# Patient Record
Sex: Male | Born: 1940 | Race: Asian | Hispanic: No | Marital: Married | Smoking: Former smoker
Health system: Southern US, Community
[De-identification: ages and names within clinical notes are randomized; demographics above are authoritative.]

## PROBLEM LIST (undated history)

## (undated) DIAGNOSIS — K219 Gastro-esophageal reflux disease without esophagitis: Secondary | ICD-10-CM

## (undated) DIAGNOSIS — E785 Hyperlipidemia, unspecified: Secondary | ICD-10-CM

## (undated) DIAGNOSIS — D61818 Other pancytopenia: Secondary | ICD-10-CM

## (undated) DIAGNOSIS — C9590 Leukemia, unspecified not having achieved remission: Secondary | ICD-10-CM

## (undated) DIAGNOSIS — C9200 Acute myeloblastic leukemia, not having achieved remission: Secondary | ICD-10-CM

## (undated) DIAGNOSIS — R51 Headache: Secondary | ICD-10-CM

## (undated) DIAGNOSIS — IMO0001 Reserved for inherently not codable concepts without codable children: Secondary | ICD-10-CM

## (undated) DIAGNOSIS — C3490 Malignant neoplasm of unspecified part of unspecified bronchus or lung: Secondary | ICD-10-CM

## (undated) DIAGNOSIS — D539 Nutritional anemia, unspecified: Secondary | ICD-10-CM

## (undated) DIAGNOSIS — T7840XA Allergy, unspecified, initial encounter: Secondary | ICD-10-CM

## (undated) DIAGNOSIS — I2510 Atherosclerotic heart disease of native coronary artery without angina pectoris: Secondary | ICD-10-CM

## (undated) DIAGNOSIS — F17200 Nicotine dependence, unspecified, uncomplicated: Secondary | ICD-10-CM

## (undated) DIAGNOSIS — I4891 Unspecified atrial fibrillation: Secondary | ICD-10-CM

## (undated) HISTORY — DX: Acute myeloblastic leukemia, not having achieved remission: C92.00

## (undated) HISTORY — DX: Atherosclerotic heart disease of native coronary artery without angina pectoris: I25.10

## (undated) HISTORY — DX: Unspecified atrial fibrillation: I48.91

## (undated) HISTORY — DX: Reserved for inherently not codable concepts without codable children: IMO0001

## (undated) HISTORY — DX: Other pancytopenia: D61.818

## (undated) HISTORY — DX: Leukemia, unspecified not having achieved remission: C95.90

## (undated) HISTORY — DX: Hyperlipidemia, unspecified: E78.5

## (undated) HISTORY — DX: Malignant neoplasm of unspecified part of unspecified bronchus or lung: C34.90

## (undated) HISTORY — DX: Nicotine dependence, unspecified, uncomplicated: F17.200

## (undated) HISTORY — DX: Nutritional anemia, unspecified: D53.9

## (undated) HISTORY — DX: Headache: R51

---

## 2003-12-13 ENCOUNTER — Ambulatory Visit (HOSPITAL_COMMUNITY): Admission: RE | Admit: 2003-12-13 | Discharge: 2003-12-13 | Payer: Self-pay | Admitting: Family Medicine

## 2004-06-04 ENCOUNTER — Emergency Department (HOSPITAL_COMMUNITY): Admission: EM | Admit: 2004-06-04 | Discharge: 2004-06-05 | Payer: Self-pay | Admitting: Emergency Medicine

## 2004-07-02 ENCOUNTER — Encounter: Admission: RE | Admit: 2004-07-02 | Discharge: 2004-07-29 | Payer: Self-pay | Admitting: Orthopedic Surgery

## 2004-07-30 ENCOUNTER — Encounter: Admission: RE | Admit: 2004-07-30 | Discharge: 2004-08-18 | Payer: Self-pay | Admitting: Orthopedic Surgery

## 2005-05-19 ENCOUNTER — Inpatient Hospital Stay (HOSPITAL_COMMUNITY): Admission: EM | Admit: 2005-05-19 | Discharge: 2005-05-22 | Payer: Self-pay | Admitting: Emergency Medicine

## 2005-07-22 ENCOUNTER — Emergency Department (HOSPITAL_COMMUNITY): Admission: EM | Admit: 2005-07-22 | Discharge: 2005-07-22 | Payer: Self-pay | Admitting: *Deleted

## 2007-01-11 ENCOUNTER — Inpatient Hospital Stay (HOSPITAL_COMMUNITY): Admission: EM | Admit: 2007-01-11 | Discharge: 2007-01-12 | Payer: Self-pay | Admitting: Emergency Medicine

## 2007-01-12 HISTORY — PX: CARDIAC CATHETERIZATION: SHX172

## 2007-01-23 HISTORY — PX: NM MYOCAR PERF WALL MOTION: HXRAD629

## 2007-12-23 ENCOUNTER — Emergency Department (HOSPITAL_COMMUNITY): Admission: EM | Admit: 2007-12-23 | Discharge: 2007-12-23 | Payer: Self-pay | Admitting: Emergency Medicine

## 2008-09-10 ENCOUNTER — Encounter: Admission: RE | Admit: 2008-09-10 | Discharge: 2008-10-20 | Payer: Self-pay | Admitting: Orthopaedic Surgery

## 2008-10-16 IMAGING — CT CT ANGIO CHEST
2 of 7 series · 19 of 36 positions shown · IV contrast (APPLIED)
Comparison: Chest x-ray dated 12/23/2007

Addendum Begins

There is a focal area of linear atelectasis at the left lung base
correlates with the abnormality on chest x-ray.  There is no
discrete infiltrate or pneumonia.
Addendum Ends
CLINICAL DATA: This of breath.  Atrial fibrillation.
CT ANGIOGRAPHY CHEST
TECHNIQUE: Multidetector CT imaging of the chest using the
standard protocol during bolus administration of intravenous
contrast. Multiplanar reconstructed images obtained and reviewed to
evaluate the vascular anatomy.
Contrast: 75 ml [id number]

[Series 8: pulm embolism 1.0 b25f thins · axial · 0.57mm/px · z∈[-132,+80]mm · 18 of 237 slices shown]
[im 12/237  lung]
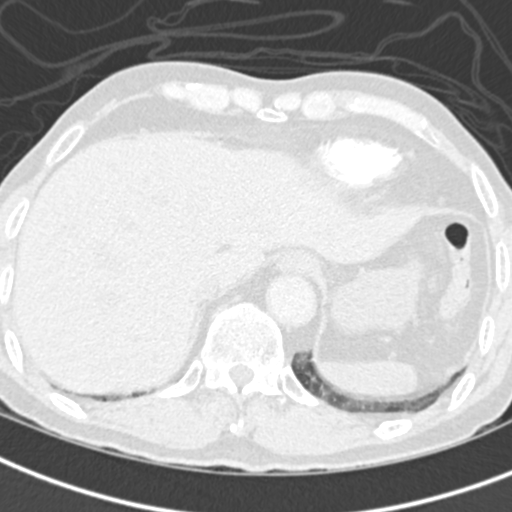
[im 24/237  mediastinal]
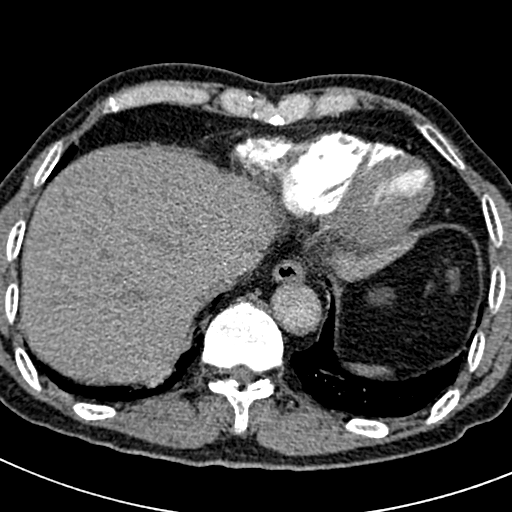
[im 36/237  lung]
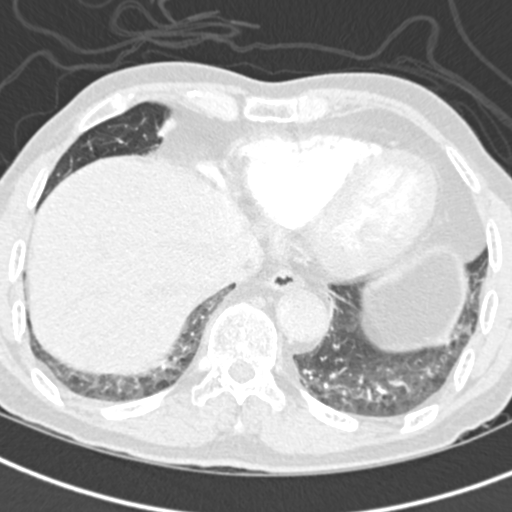
[im 48/237  mediastinal]
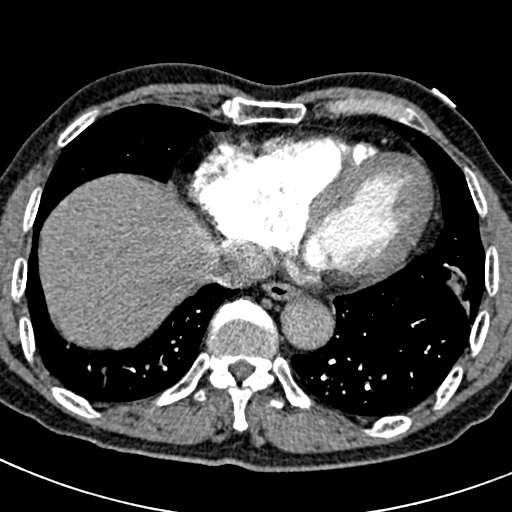
[im 60/237  lung]
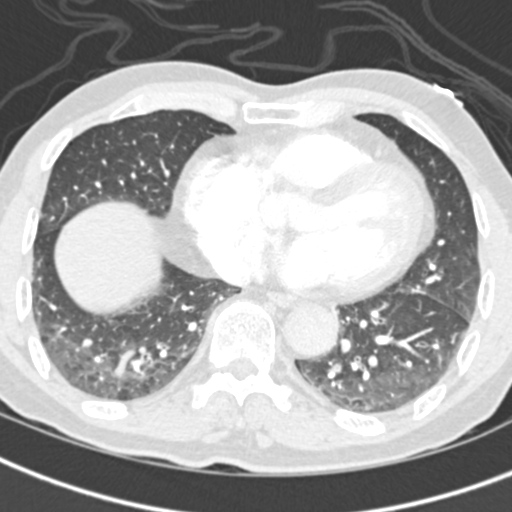
[im 71/237  mediastinal]
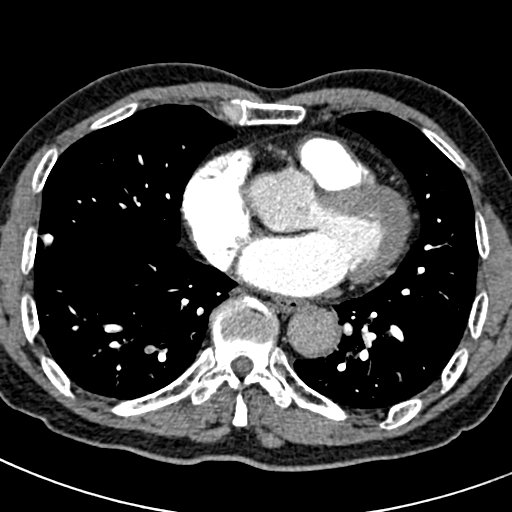
[im 83/237  lung]
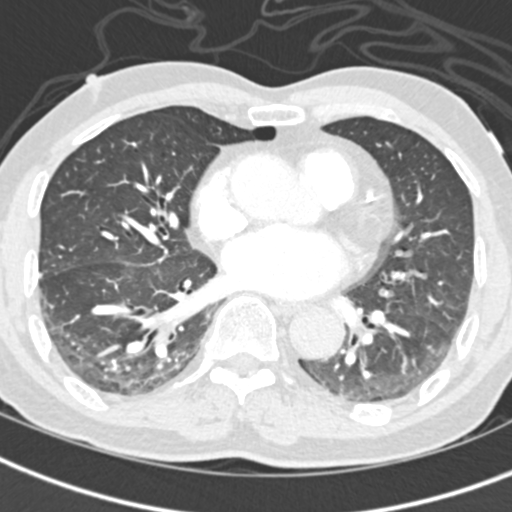
[im 95/237  mediastinal]
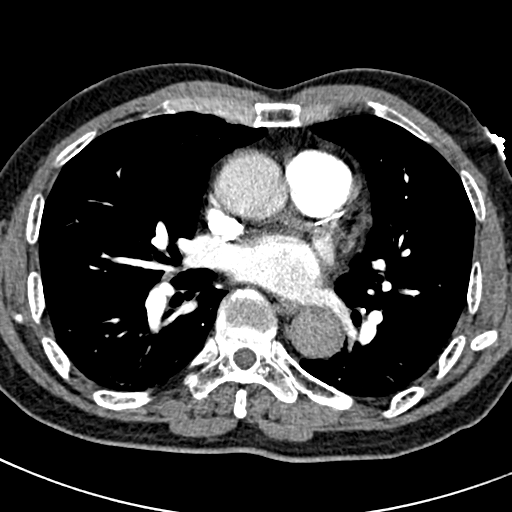
[im 107/237  lung]
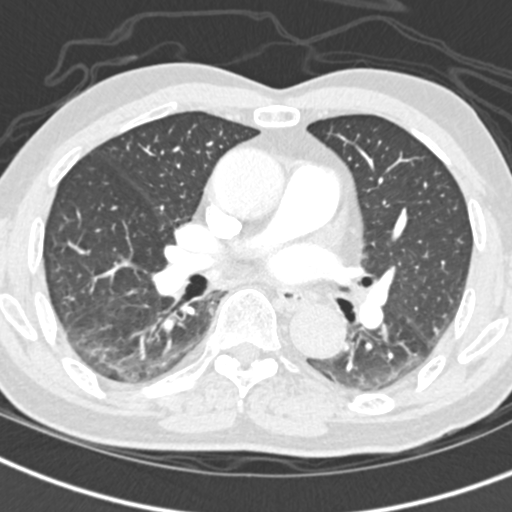
[im 130/237  mediastinal]
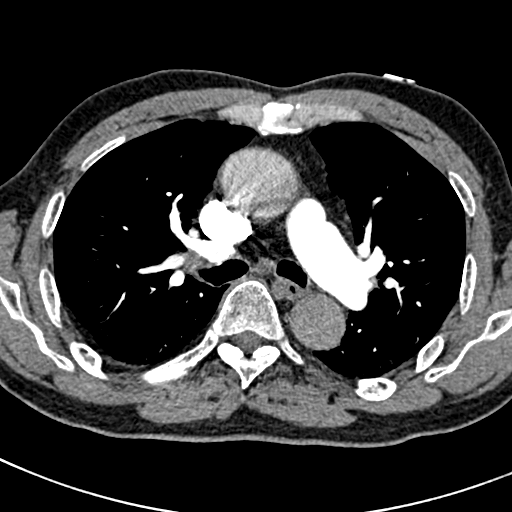
[im 142/237  lung]
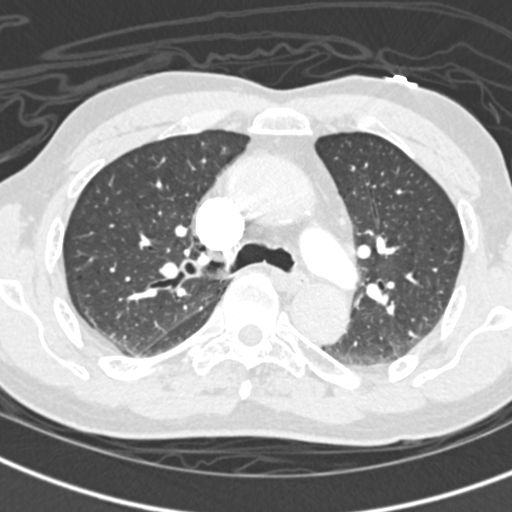
[im 154/237  mediastinal]
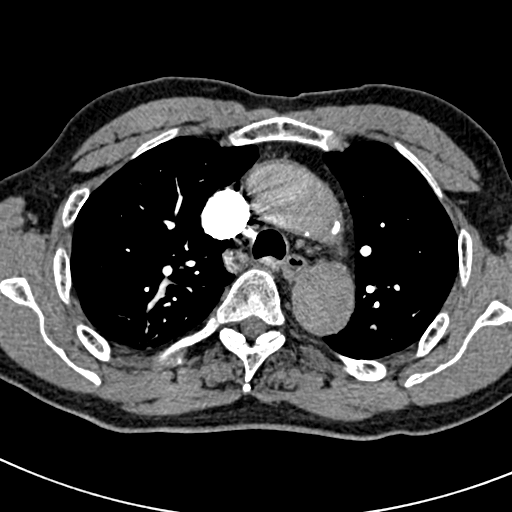
[im 166/237  lung]
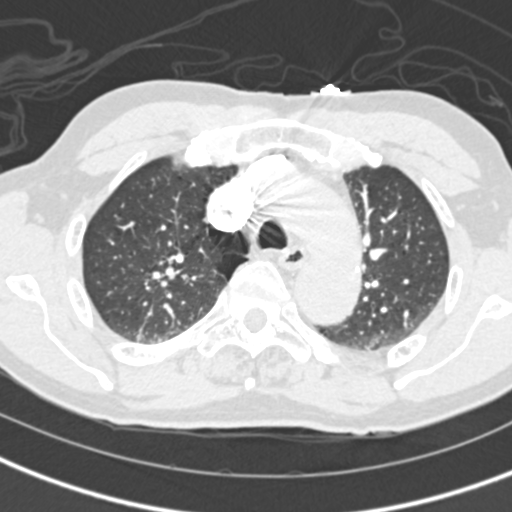
[im 178/237  mediastinal]
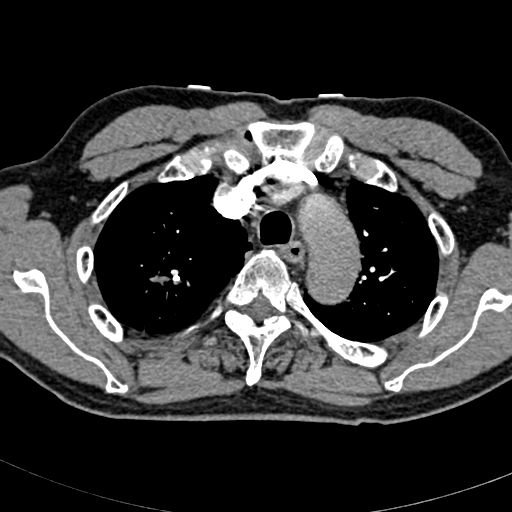
[im 189/237  lung]
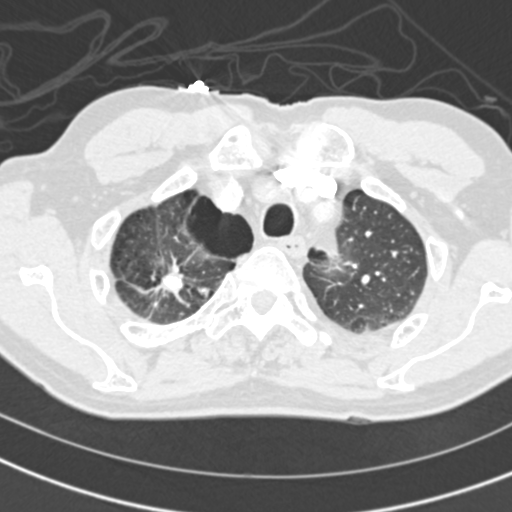
[im 201/237  mediastinal]
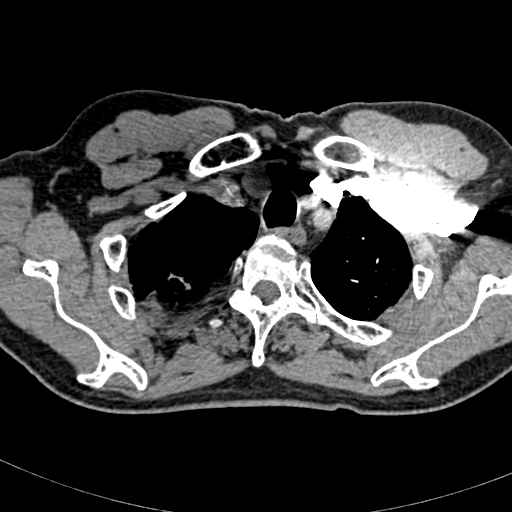
[im 213/237  lung]
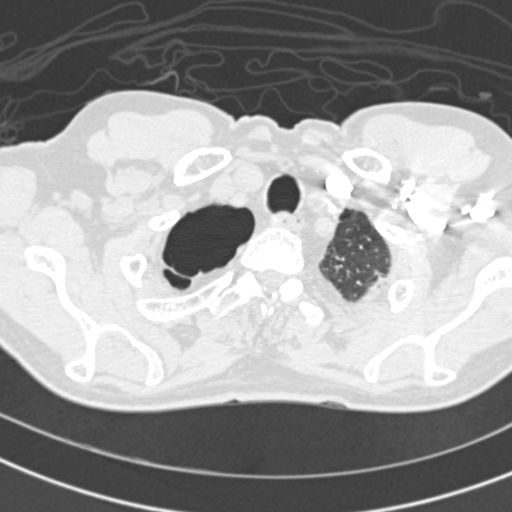
[im 225/237  mediastinal]
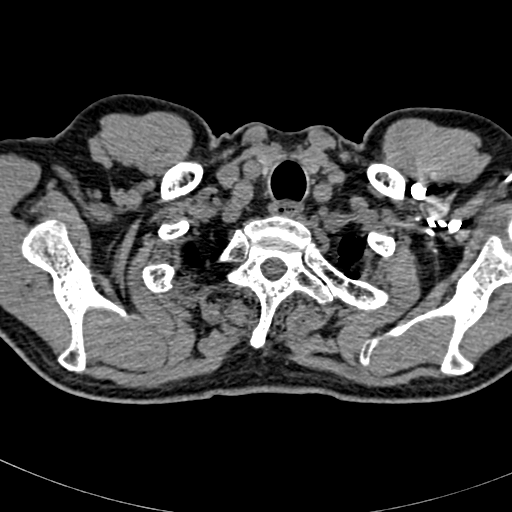

[Series 603: cor chest · coronal · 0.57mm/px · 1 of 92 slices shown]
[im 46/92  mediastinal]
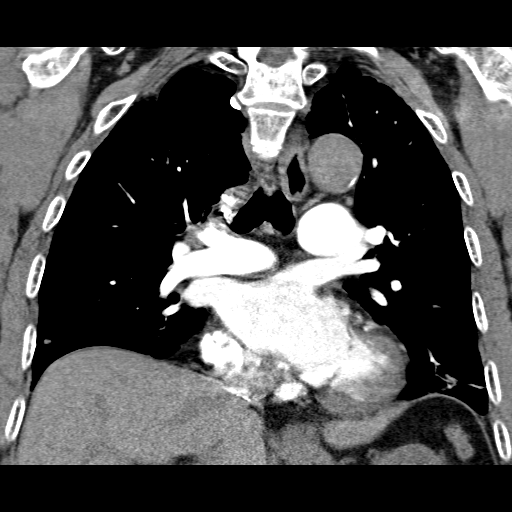

[19 of 36 positions shown; findings below may reference images not displayed]

FINDINGS: There is no evidence of pulmonary emboli, infiltrates, or
effusions.  There is a calcified 9 mm granuloma in the right middle
lobe adjacent to the major fissure at the right lung base.  There
are  areas of chronic scarring and bleb formation in the right apex
with some parenchymal calcifications.  This appears unchanged on
multiple prior chest x-rays.

There is a 3 mm nonspecific nodule along the superior aspect of the
major fissure on the right with a 1.5 mm peripheral nodule seen on
the same image, image number 46.  These are most likely benign.
There is another small ill-defined soft tissue density adjacent to
the main pulmonary vein from the right lower lobe on image number
50 which is probably a small lymph node  and is not felt to be
significant.

There is no significant hilar or mediastinal adenopathy.  There is
some tortuosity of the thoracic aorta.  The arch of the aorta is
slightly dilated to a diameter of approximate 4 cm.

No bony abnormality.  Borderline cardiomegaly.
IMPRESSION: No acute disease in the chest. No pulmonary emboli. Scarring at the
right apex.  Calcified granuloma in the right middle lobe
laterally.

## 2008-10-16 IMAGING — CR DG CHEST 2V
2 series · 2 of 2 positions shown · non-contrast
Comparison: 01/11/2007 and earlier.

CLINICAL DATA: 56-year-old male with chest pain.

CHEST - 2 VIEW

[w chest pa]
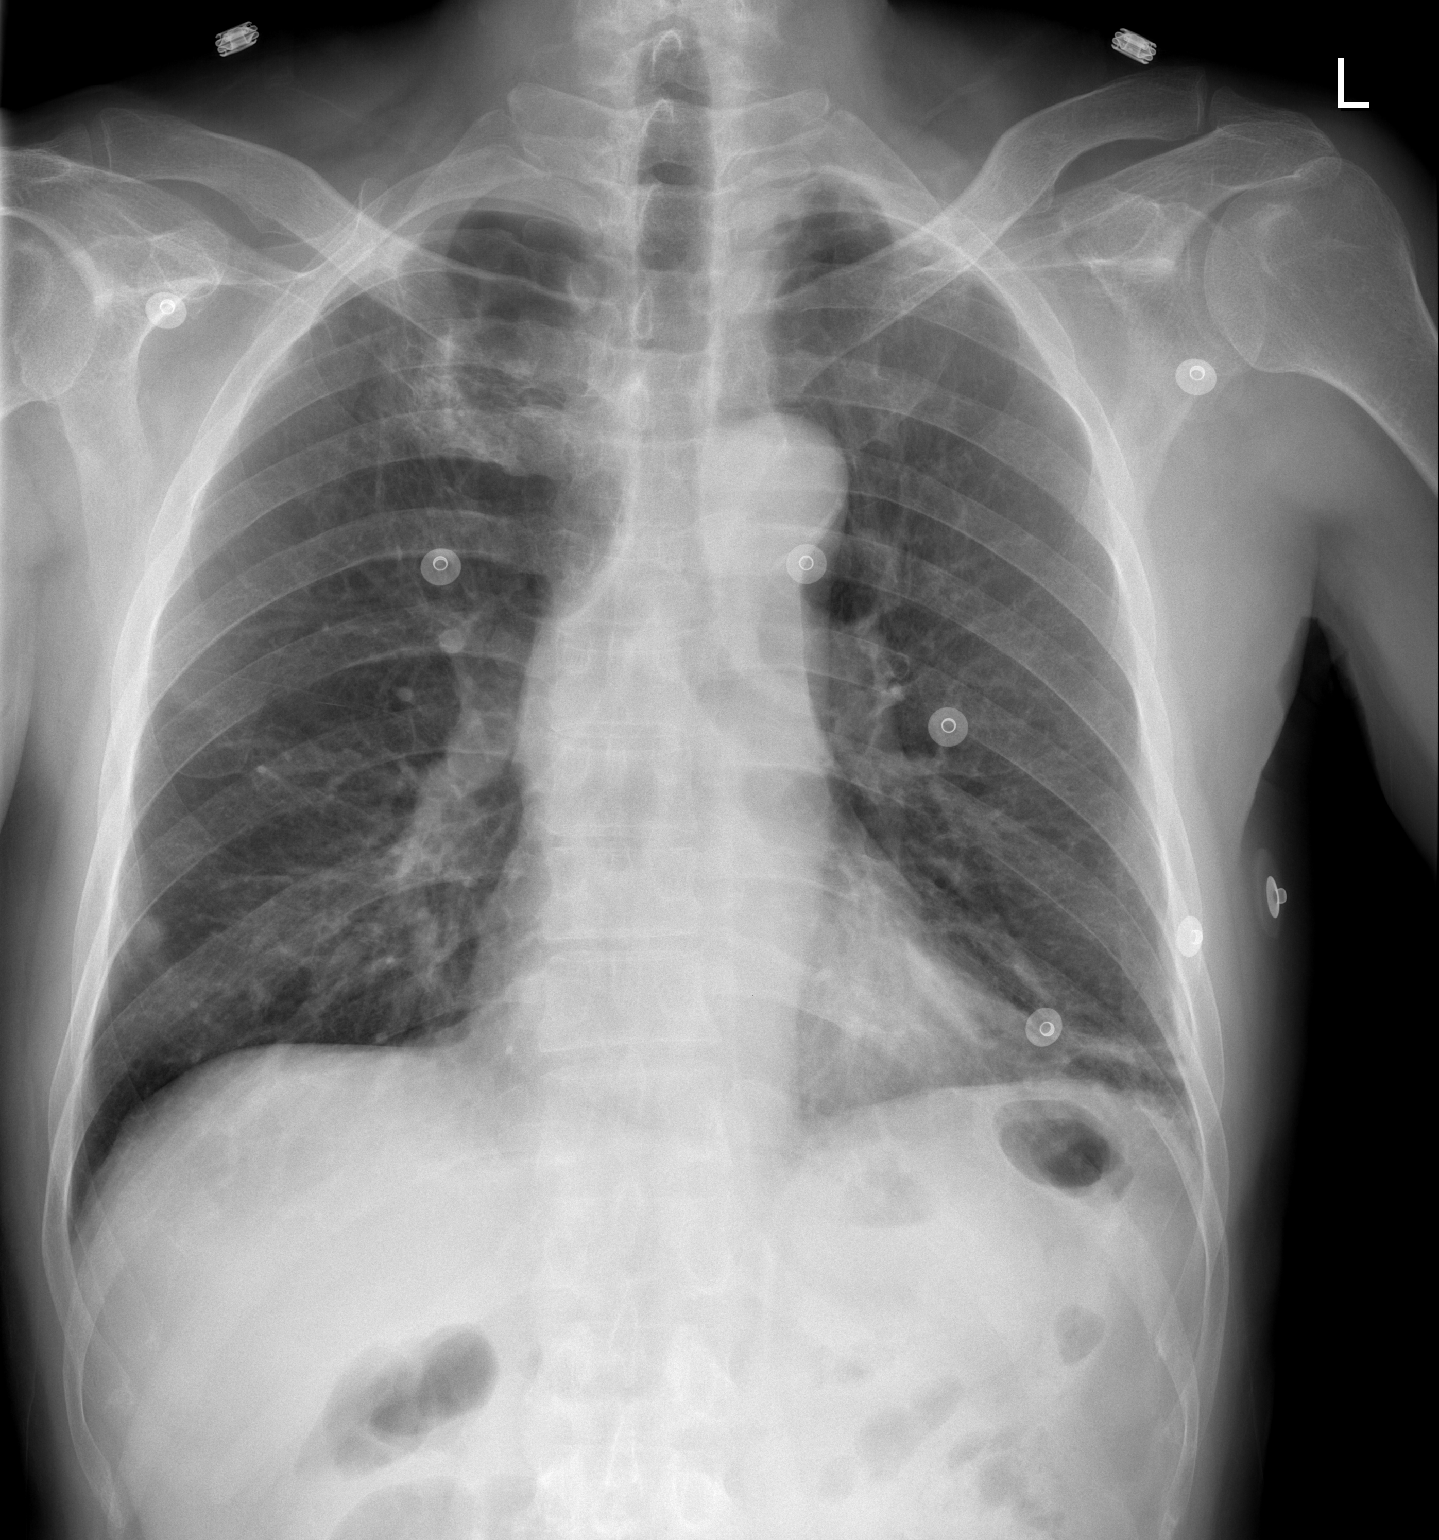

[w chest lat]
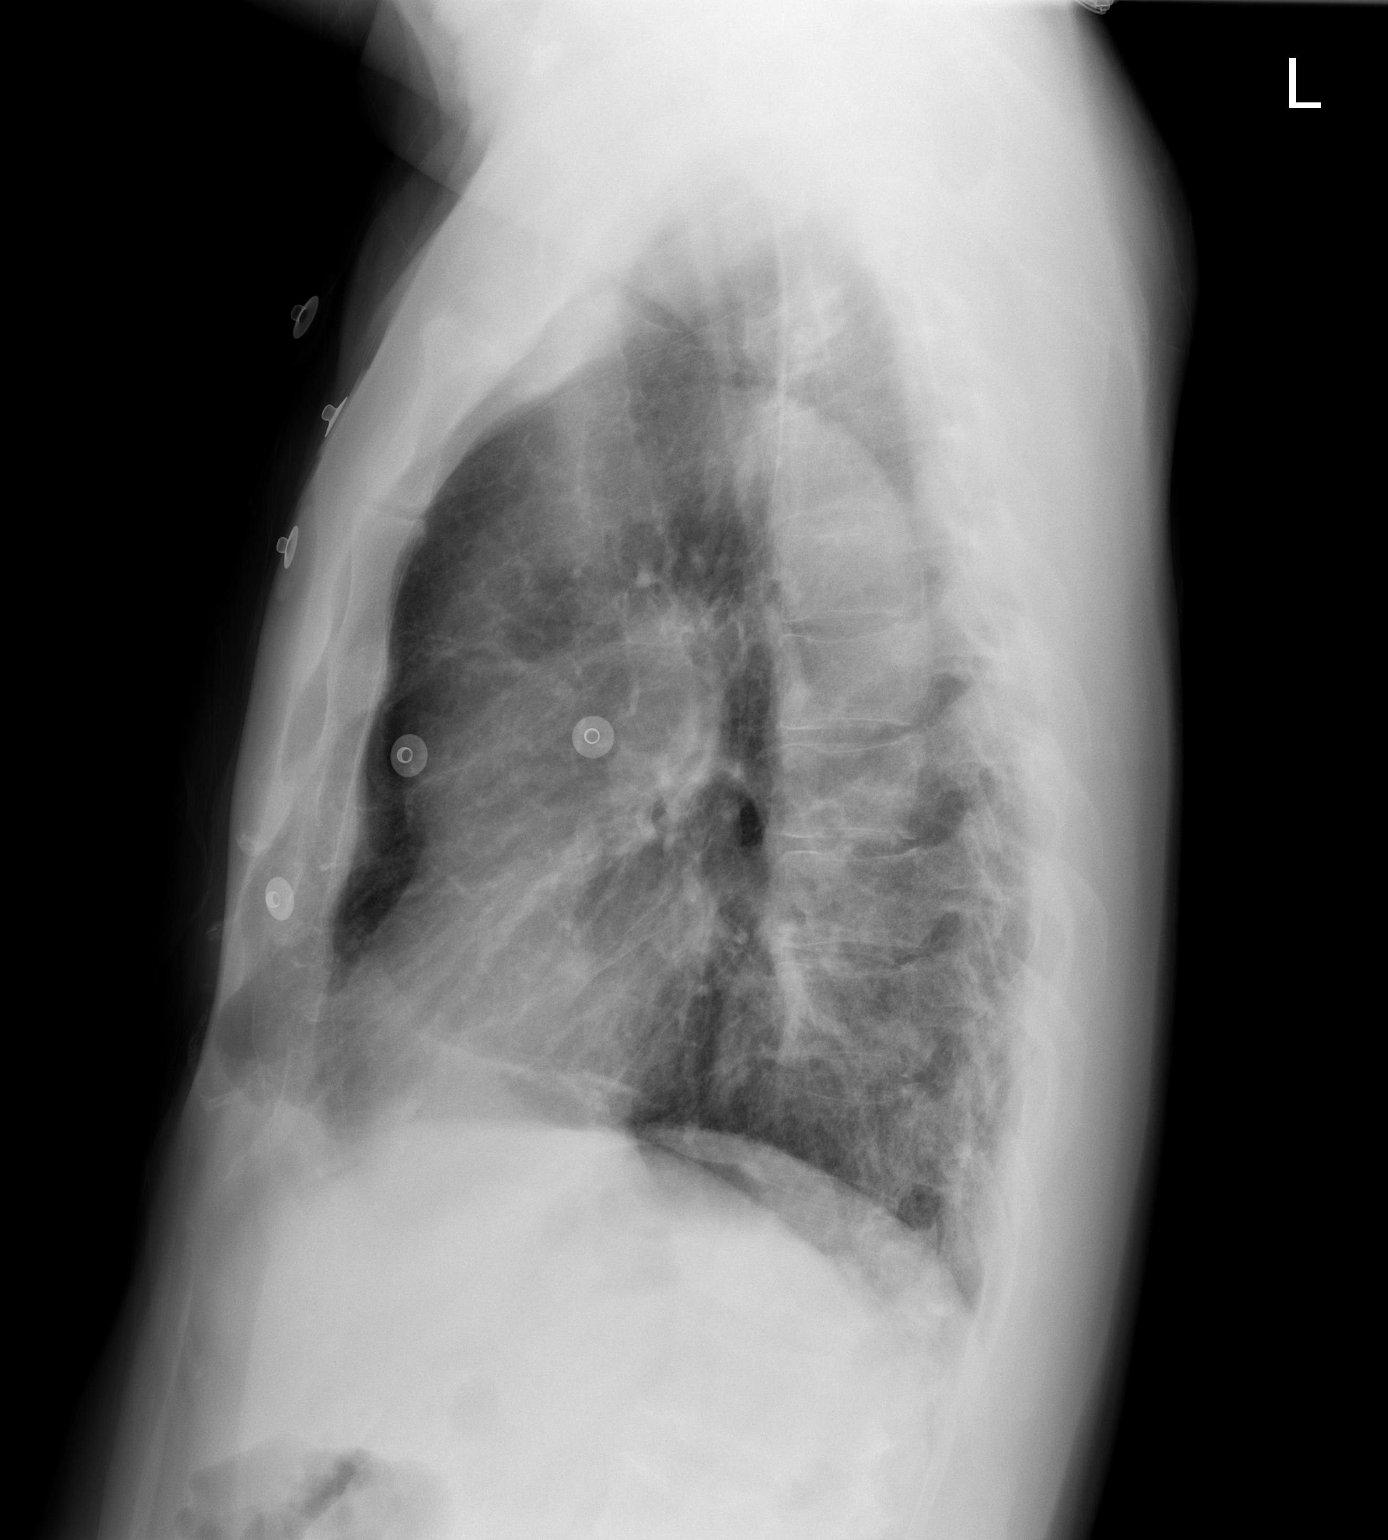

[2 of 2 positions shown; findings below may reference images not displayed]

FINDINGS: Stable cardiac size and mediastinal contours.  Ectatic
thoracic aorta re-identified.  Interval increased streaky left
lower lobe opacity.  Chronic right lung pulmonary nodule measures
roughly 9 mm (6 - 7 mm on 06/04/2004), suggesting benign etiology.
No pneumothorax, pulmonary edema or pleural effusion. Stable
visualized osseous structures.
IMPRESSION: 1.  Developing left lower lobe pneumonia.
2.  Chronic right lung base nodule, favor benign etiology given
little growth since 5002.  Attention on follow-up exams.

## 2009-09-10 ENCOUNTER — Inpatient Hospital Stay (HOSPITAL_COMMUNITY): Admission: EM | Admit: 2009-09-10 | Discharge: 2009-09-16 | Payer: Self-pay | Admitting: Emergency Medicine

## 2009-09-11 ENCOUNTER — Encounter (INDEPENDENT_AMBULATORY_CARE_PROVIDER_SITE_OTHER): Payer: Self-pay | Admitting: Family Medicine

## 2009-09-14 ENCOUNTER — Encounter (INDEPENDENT_AMBULATORY_CARE_PROVIDER_SITE_OTHER): Payer: Self-pay | Admitting: Cardiology

## 2010-06-13 LAB — DIFFERENTIAL
Basophils Absolute: 0 10*3/uL (ref 0.0–0.1)
Basophils Relative: 1 % (ref 0–1)
Eosinophils Absolute: 0.6 10*3/uL (ref 0.0–0.7)
Eosinophils Relative: 9 % — ABNORMAL HIGH (ref 0–5)
Lymphocytes Relative: 37 % (ref 12–46)
Lymphs Abs: 2.2 10*3/uL (ref 0.7–4.0)
Monocytes Absolute: 0.6 10*3/uL (ref 0.1–1.0)
Monocytes Relative: 10 % (ref 3–12)
Neutro Abs: 2.6 10*3/uL (ref 1.7–7.7)
Neutrophils Relative %: 43 % (ref 43–77)

## 2010-06-13 LAB — CBC
HCT: 35.3 % — ABNORMAL LOW (ref 39.0–52.0)
HCT: 37.2 % — ABNORMAL LOW (ref 39.0–52.0)
HCT: 37.5 % — ABNORMAL LOW (ref 39.0–52.0)
HCT: 38.3 % — ABNORMAL LOW (ref 39.0–52.0)
HCT: 38.4 % — ABNORMAL LOW (ref 39.0–52.0)
HCT: 39.1 % (ref 39.0–52.0)
HCT: 40.2 % (ref 39.0–52.0)
Hemoglobin: 12 g/dL — ABNORMAL LOW (ref 13.0–17.0)
Hemoglobin: 12.7 g/dL — ABNORMAL LOW (ref 13.0–17.0)
Hemoglobin: 12.9 g/dL — ABNORMAL LOW (ref 13.0–17.0)
Hemoglobin: 13.3 g/dL (ref 13.0–17.0)
Hemoglobin: 13.3 g/dL (ref 13.0–17.0)
Hemoglobin: 13.5 g/dL (ref 13.0–17.0)
Hemoglobin: 13.6 g/dL (ref 13.0–17.0)
MCHC: 33.9 g/dL (ref 30.0–36.0)
MCHC: 33.9 g/dL (ref 30.0–36.0)
MCHC: 34.1 g/dL (ref 30.0–36.0)
MCHC: 34.5 g/dL (ref 30.0–36.0)
MCHC: 34.6 g/dL (ref 30.0–36.0)
MCHC: 34.7 g/dL (ref 30.0–36.0)
MCHC: 34.7 g/dL (ref 30.0–36.0)
MCV: 95.2 fL (ref 78.0–100.0)
MCV: 95.5 fL (ref 78.0–100.0)
MCV: 95.7 fL (ref 78.0–100.0)
MCV: 96.1 fL (ref 78.0–100.0)
MCV: 96.2 fL (ref 78.0–100.0)
MCV: 96.5 fL (ref 78.0–100.0)
MCV: 97.2 fL (ref 78.0–100.0)
Platelets: 115 10*3/uL — ABNORMAL LOW (ref 150–400)
Platelets: 121 10*3/uL — ABNORMAL LOW (ref 150–400)
Platelets: 130 10*3/uL — ABNORMAL LOW (ref 150–400)
Platelets: 131 10*3/uL — ABNORMAL LOW (ref 150–400)
Platelets: 134 10*3/uL — ABNORMAL LOW (ref 150–400)
Platelets: 138 10*3/uL — ABNORMAL LOW (ref 150–400)
Platelets: 142 10*3/uL — ABNORMAL LOW (ref 150–400)
RBC: 3.67 MIL/uL — ABNORMAL LOW (ref 4.22–5.81)
RBC: 3.9 MIL/uL — ABNORMAL LOW (ref 4.22–5.81)
RBC: 3.9 MIL/uL — ABNORMAL LOW (ref 4.22–5.81)
RBC: 4 MIL/uL — ABNORMAL LOW (ref 4.22–5.81)
RBC: 4.03 MIL/uL — ABNORMAL LOW (ref 4.22–5.81)
RBC: 4.05 MIL/uL — ABNORMAL LOW (ref 4.22–5.81)
RBC: 4.13 MIL/uL — ABNORMAL LOW (ref 4.22–5.81)
RDW: 13.6 % (ref 11.5–15.5)
RDW: 13.8 % (ref 11.5–15.5)
RDW: 13.9 % (ref 11.5–15.5)
RDW: 13.9 % (ref 11.5–15.5)
RDW: 13.9 % (ref 11.5–15.5)
RDW: 14 % (ref 11.5–15.5)
RDW: 14 % (ref 11.5–15.5)
WBC: 6 10*3/uL (ref 4.0–10.5)
WBC: 6.5 10*3/uL (ref 4.0–10.5)
WBC: 7.4 10*3/uL (ref 4.0–10.5)
WBC: 7.4 10*3/uL (ref 4.0–10.5)
WBC: 8.4 10*3/uL (ref 4.0–10.5)
WBC: 8.9 10*3/uL (ref 4.0–10.5)
WBC: 9.4 10*3/uL (ref 4.0–10.5)

## 2010-06-13 LAB — BASIC METABOLIC PANEL
BUN: 11 mg/dL (ref 6–23)
BUN: 11 mg/dL (ref 6–23)
BUN: 17 mg/dL (ref 6–23)
BUN: 8 mg/dL (ref 6–23)
BUN: 8 mg/dL (ref 6–23)
BUN: 9 mg/dL (ref 6–23)
BUN: 9 mg/dL (ref 6–23)
CO2: 20 mEq/L (ref 19–32)
CO2: 22 mEq/L (ref 19–32)
CO2: 25 mEq/L (ref 19–32)
CO2: 25 mEq/L (ref 19–32)
CO2: 26 mEq/L (ref 19–32)
CO2: 26 mEq/L (ref 19–32)
CO2: 28 mEq/L (ref 19–32)
Calcium: 8.1 mg/dL — ABNORMAL LOW (ref 8.4–10.5)
Calcium: 8.3 mg/dL — ABNORMAL LOW (ref 8.4–10.5)
Calcium: 8.3 mg/dL — ABNORMAL LOW (ref 8.4–10.5)
Calcium: 8.5 mg/dL (ref 8.4–10.5)
Calcium: 8.5 mg/dL (ref 8.4–10.5)
Calcium: 8.8 mg/dL (ref 8.4–10.5)
Calcium: 9 mg/dL (ref 8.4–10.5)
Chloride: 107 mEq/L (ref 96–112)
Chloride: 109 mEq/L (ref 96–112)
Chloride: 109 mEq/L (ref 96–112)
Chloride: 109 mEq/L (ref 96–112)
Chloride: 111 mEq/L (ref 96–112)
Chloride: 112 mEq/L (ref 96–112)
Chloride: 112 mEq/L (ref 96–112)
Creatinine, Ser: 0.85 mg/dL (ref 0.4–1.5)
Creatinine, Ser: 0.88 mg/dL (ref 0.4–1.5)
Creatinine, Ser: 0.89 mg/dL (ref 0.4–1.5)
Creatinine, Ser: 0.95 mg/dL (ref 0.4–1.5)
Creatinine, Ser: 0.97 mg/dL (ref 0.4–1.5)
Creatinine, Ser: 1.01 mg/dL (ref 0.4–1.5)
Creatinine, Ser: 1.06 mg/dL (ref 0.4–1.5)
GFR calc Af Amer: >60 mL/min (ref >60)
GFR calc Af Amer: >60 mL/min (ref >60)
GFR calc Af Amer: >60 mL/min (ref >60)
GFR calc Af Amer: >60 mL/min (ref >60)
GFR calc Af Amer: >60 mL/min (ref >60)
GFR calc Af Amer: >60 mL/min (ref >60)
GFR calc Af Amer: >60 mL/min (ref >60)
GFR calc non Af Amer: >60 mL/min (ref >60)
GFR calc non Af Amer: >60 mL/min (ref >60)
GFR calc non Af Amer: >60 mL/min (ref >60)
GFR calc non Af Amer: >60 mL/min (ref >60)
GFR calc non Af Amer: >60 mL/min (ref >60)
GFR calc non Af Amer: >60 mL/min (ref >60)
GFR calc non Af Amer: >60 mL/min (ref >60)
Glucose, Bld: 100 mg/dL — ABNORMAL HIGH (ref 70–99)
Glucose, Bld: 102 mg/dL — ABNORMAL HIGH (ref 70–99)
Glucose, Bld: 103 mg/dL — ABNORMAL HIGH (ref 70–99)
Glucose, Bld: 108 mg/dL — ABNORMAL HIGH (ref 70–99)
Glucose, Bld: 112 mg/dL — ABNORMAL HIGH (ref 70–99)
Glucose, Bld: 89 mg/dL (ref 70–99)
Glucose, Bld: 98 mg/dL (ref 70–99)
Potassium: 3.3 mEq/L — ABNORMAL LOW (ref 3.5–5.1)
Potassium: 3.4 mEq/L — ABNORMAL LOW (ref 3.5–5.1)
Potassium: 3.5 mEq/L (ref 3.5–5.1)
Potassium: 3.6 mEq/L (ref 3.5–5.1)
Potassium: 3.6 mEq/L (ref 3.5–5.1)
Potassium: 3.7 mEq/L (ref 3.5–5.1)
Potassium: 5 mEq/L (ref 3.5–5.1)
Sodium: 138 mEq/L (ref 135–145)
Sodium: 139 mEq/L (ref 135–145)
Sodium: 140 mEq/L (ref 135–145)
Sodium: 140 mEq/L (ref 135–145)
Sodium: 140 mEq/L (ref 135–145)
Sodium: 141 mEq/L (ref 135–145)
Sodium: 141 mEq/L (ref 135–145)

## 2010-06-13 LAB — URINALYSIS, ROUTINE W REFLEX MICROSCOPIC
Bilirubin Urine: NEGATIVE
Glucose, UA: NEGATIVE mg/dL
Hgb urine dipstick: NEGATIVE
Ketones, ur: NEGATIVE mg/dL
Nitrite: NEGATIVE
Protein, ur: NEGATIVE mg/dL
Specific Gravity, Urine: 1.007 (ref 1.005–1.030)
Urobilinogen, UA: 0.2 mg/dL (ref 0.0–1.0)
pH: 5.5 (ref 5.0–8.0)

## 2010-06-13 LAB — CK TOTAL AND CKMB (NOT AT ARMC)
CK, MB: 5.6 ng/mL — ABNORMAL HIGH (ref 0.3–4.0)
CK, MB: 9.1 ng/mL (ref 0.3–4.0)
Relative Index: 1.9 (ref 0.0–2.5)
Relative Index: 2.1 (ref 0.0–2.5)
Total CK: 302 U/L — ABNORMAL HIGH (ref 7–232)
Total CK: 440 U/L — ABNORMAL HIGH (ref 7–232)

## 2010-06-13 LAB — POCT CARDIAC MARKERS
CKMB, poc: 5.3 ng/mL (ref 1.0–8.0)
CKMB, poc: 5.5 ng/mL (ref 1.0–8.0)
Myoglobin, poc: 116 ng/mL (ref 12–200)
Myoglobin, poc: 195 ng/mL (ref 12–200)
Troponin i, poc: <0.05 ng/mL (ref 0.00–0.09)
Troponin i, poc: <0.05 ng/mL (ref 0.00–0.09)

## 2010-06-13 LAB — PROTIME-INR
INR: 0.98 (ref 0.00–1.49)
INR: 1.1 (ref 0.00–1.49)
INR: 1.69 — ABNORMAL HIGH (ref 0.00–1.49)
INR: 2 — ABNORMAL HIGH (ref 0.00–1.49)
INR: 2.31 — ABNORMAL HIGH (ref 0.00–1.49)
INR: 2.32 — ABNORMAL HIGH (ref 0.00–1.49)
Prothrombin Time: 12.9 seconds (ref 11.6–15.2)
Prothrombin Time: 14.1 seconds (ref 11.6–15.2)
Prothrombin Time: 19.7 seconds — ABNORMAL HIGH (ref 11.6–15.2)
Prothrombin Time: 22.5 seconds — ABNORMAL HIGH (ref 11.6–15.2)
Prothrombin Time: 25.2 seconds — ABNORMAL HIGH (ref 11.6–15.2)
Prothrombin Time: 25.3 seconds — ABNORMAL HIGH (ref 11.6–15.2)

## 2010-06-13 LAB — HEPARIN LEVEL (UNFRACTIONATED)
Heparin Unfractionated: 0.28 IU/mL — ABNORMAL LOW (ref 0.30–0.70)
Heparin Unfractionated: 0.5 IU/mL (ref 0.30–0.70)
Heparin Unfractionated: 0.5 IU/mL (ref 0.30–0.70)
Heparin Unfractionated: 0.58 IU/mL (ref 0.30–0.70)
Heparin Unfractionated: 0.73 IU/mL — ABNORMAL HIGH (ref 0.30–0.70)
Heparin Unfractionated: 0.78 IU/mL — ABNORMAL HIGH (ref 0.30–0.70)

## 2010-06-13 LAB — URINE CULTURE
Colony Count: NO GROWTH
Culture: NO GROWTH

## 2010-06-13 LAB — TROPONIN I
Troponin I: 0.01 ng/mL (ref 0.00–0.06)
Troponin I: 0.04 ng/mL (ref 0.00–0.06)

## 2010-06-13 LAB — LIPID PANEL
Cholesterol: 121 mg/dL (ref 0–200)
HDL: 41 mg/dL (ref >39)
LDL Cholesterol: 74 mg/dL (ref 0–99)
Total CHOL/HDL Ratio: 3 RATIO
Triglycerides: 29 mg/dL (ref <150)
VLDL: 6 mg/dL (ref 0–40)

## 2010-06-13 LAB — ANTI-RIBONUCLEIC ACID ANTIBODY: Sm/rnp: <1 AU/mL (ref <30)

## 2010-06-13 LAB — MAGNESIUM
Magnesium: 1.9 mg/dL (ref 1.5–2.5)
Magnesium: 1.9 mg/dL (ref 1.5–2.5)

## 2010-06-13 LAB — TSH: TSH: 2.407 u[IU]/mL (ref 0.350–4.500)

## 2010-06-13 LAB — T4, FREE: Free T4: 1.22 ng/dL (ref 0.80–1.80)

## 2010-06-13 LAB — APTT: aPTT: 39 seconds — ABNORMAL HIGH (ref 24–37)

## 2010-06-13 LAB — MRSA PCR SCREENING: MRSA by PCR: NEGATIVE

## 2010-08-10 NOTE — Discharge Summary (Signed)
Gerald [name], Gerald [name]                   ACCOUNT NO.:  [id number]   MEDICAL RECORD NO.:  [id number]          PATIENT TYPE:  INP   LOCATION:  2922                         FACILITY:  [facility]   PHYSICIAN:  Madaline Savage, M.D.DATE OF BIRTH:  1940/12/18   DATE OF ADMISSION:  01/11/2007  DATE OF DISCHARGE:  01/12/2007                               DISCHARGE SUMMARY   DISCHARGE DIAGNOSES:  1. Chest pain worrisome for unstable angina, catheterization this      admission showing mild to moderate coronary disease.  Plan is for      medical therapy.  2. Treated dyslipidemia  3. History of paroxysmal atrial fibrillation  in the past.   HOSPITAL COURSE:  The patient is a 70 year old [place] male with  history of prior catheterization showing nonobstructive coronary  disease.  He presented to the emergency room January 11, 2007 with chest  pain, dyspnea and weakness.  Symptoms were worrisome for unstable  angina.  Symptoms came on while he was exercising.  The patient was  admitted to telemetry, started on Lovenox.  He had been on a beta  blocker in the past but he had stopped this and this was resumed.  He  underwent diagnostic catheterization January 12, 2007 by Dr. Alanda Amass.  His enzymes were negative x3.  Catheterization revealed a 30-40% mid  RCA, 40-50% distal RCA, 40% proximal LAD and normal circ and OM with an  EF of 60%.  Dr. Alanda Amass feels we can treat him medically.  There is  essentially no change in his anatomy.  We will add a PPI.  Dr. Alanda Amass  wants him to have an outpatient Myoview study and we will set this up  and then he will follow-up with Dr. Elsie Lincoln.   DISCHARGE MEDICATIONS:  1. Toprol XL 25 mg a day.  2. Coated aspirin 325 mg a day  3. Lipitor 10 mg a day  4,  Protonix 40 mg a day for 1 month then Prilosec OTC 20 mg daily.   LABS:  Troponins as noted were negative.  Cholesterol was 151 with an  LDL of 90, HDL 44.  Liver functions were normal.  Sodium 141, potassium  3.8, BUN 12, creatinine 1.0, white count 6.6, hemoglobin 13.4,  hematocrit 39.3, platelets 172.  TSH 1.65. Portable chest:  Borderline  heart size, COPD, right upper lobe scarring with no acute changes.  EKG  was sinus rhythm without acute changes.   DISPOSITION:  The patient is discharged in stable condition and will  follow-up with Dr. Elsie Lincoln after an outpatient Persantine Myoview.      Abelino Derrick, P.A.    ______________________________  Madaline Savage, M.D.   Lenard Lance  D:  01/12/2007  T:  01/14/2007  Job:  [id number]

## 2010-08-10 NOTE — Cardiovascular Report (Signed)
[name], RINGEL                   ACCOUNT NO.:  [id number]   MEDICAL RECORD NO.:  [id number]          PATIENT TYPE:  INP   LOCATION:  2922                         FACILITY:  [facility]   PHYSICIAN:  Richard A. Alanda Amass, M.D.DATE OF BIRTH:  1940/12/31   DATE OF PROCEDURE:  01/12/2007  DATE OF DISCHARGE:  01/12/2007                            CARDIAC CATHETERIZATION   PROCEDURE:  Retrograde central aortic catheterization, selective  coronary angiography pre and post IC nitroglycerin administration by  Judkins technique, LV angiogram, RAO, LAO projection, abdominal aortic  angiogram, midstream PA  projection, right femoral artery, common  femoral artery closure with 6 French StarClose Nitinol clip device  successful.   DESCRIPTION OF PROCEDURE:  The patient was brought to the second floor  CP lab in a post absorptive state after 5 mg of Valium p.o.  premedication.  Heparin was held on call to the catheter lab; 1%  Xylocaine was used for local anesthesia.  No further conscious sedation  was necessary in the laboratory.  Mr. Asfaw son acted as Nurse, learning disability  and interpreter, was very helpful throughout the procedure which the  patient tolerated well. The right groin was prepped, draped in the usual  sterile manner.  The CRFA was entered with a single anterior puncture,  using an 18 thin-wall needle and a 6 [place] short.  Side arm and sheath  were inserted without difficulty.  Diagnostic left coronary angiography  was done with 6 French 4 cm taper, preformed Cordis coronary catheter.  IC nitroglycerin was administered at 150 mcg into the left coronary  system with repeat injections obtained.  Initially right coronary  angiography was done with a 6 French 4 cm J __________ catheter but  because of some damping related to a large proximal RV branch, this was  changed to a 5 French 4 cm taper catheter for selective main right  coronary injections.  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  LV angiogram was done in the RAO and LAO  projection, 25 mL, 14 mL/sec., 20 mL, 12 mL/sec. through a 6 French  pigtail catheter.  Pull back pressure of the CA was performed, showed no  gradient across the aortic valve.  The catheter was pulled down above  the level of the renal arteries and injection was performed.  There were  single normal patent renal arteries bilaterally and normal infrarenal  abdominal aorta.   PRESSURES:  LV:  115/0; LVEDP 15 mmHg.  CA:  115/60 mmHg.  There is no gradient across the aortic valve on pull  back.  Fluoroscopy:  The underlying fluoroscopy showed +3 segmental  calcification of the LAD. That appeared to be predominantly eccentric  and nonobstructive.  There was faint mitral annular calcification seen.  No significant right coronary calcification.   The main left coronary artery was widely patent and normal.   The left anterior descending artery had 40% mildly segmental narrowing  in the proximal third eccentrically with nonobstructive calcification  present.  This appeared unchanged from prior angiography in May 20, 2005.  There was systolic kinking of the mid-LAD but no systolic  Compression and there was systolic kinking of the distal third of the  LAD with no systolic compression.  The LAD was a large vessel that  coursed the apex of the heart and bifurcated.   The first diagonal was a moderate size vessel to small which bifurcated,  arose from the very proximal LAD and had no significant obstruction.  The second diagonal was a moderate size vessel that rose after the  septal perforator and proximal LAD mild narrowing, bifurcated and had no  significant stenosis.   The circumflex was a moderately large nondominant vessel that gave off  an atrial branch proximally.  There  was a small OM1 and a large  bifurcating OM2 from the AV group circumflex.  The PAVG branch  bifurcated and was large and had significant stenosis.   The right coronary was a dominant vessel.  There was a large RV branch  that was normal arising from the proximal portion.   There were tandem lesions in the mid-RCA.  There was eccentric 30 to 40%  narrowing that was smooth in the mid-RCA.  Just beyond this, this was  mildly focal and there was another eccentric smooth segmental lesion of  40 to 50% probably 50% in some projections that was smooth in the mid-  RCA.  Distal RCA gave a bifurcating PLA that was normal and a normal  PDA.   There was late filling of a retrograde branch that appeared to be  arterial and probably represented an AV fistula arising from the distal  RCA emptying into the right atrium.  This appeared to be a relatively  small fistula and I doubt whether that it was large enough to consider  any coronary steel related to this and I would consider this essentially  an incidental finding in this setting.   LV angiogram in the RAO and LAO projections show normal contracting  ventricle, EF of approximately 60%.  No mitral regurgitation was  present.   Abdominal angiogram demonstrated single patent normal renal arteries on  hand injection and what appeared to be widely patent infrarenal  abdominal aorta.   DISCUSSION:  This 70 year old married [place] gentleman with his  wife, 6 children and 9 grandchildren quit smoking in 2006.  He underwent  catheterization February 2007, for symptoms of chest pain and was found  to have noncritical coronary disease with minor disease of the LAD and  50% narrowing of the mid-RCA with normal LV function.  He was treated  medically and did well until this current admission January 11, 2007,  when he was admitted with chest pain, shortness of breath, weakness and  dizziness.  His chest discomfort was predominantly pressure.  In   retrospect the patient has not had any significant GI symptoms and had  not been on prophylactic GI medicine but had been on baby aspirin and  Lipitor.  He was supposed to be on beta blockers but I am not sure they  were being taken.  Myocardial infarction was ruled out by serial enzymes  and EKGs.  Catheterization demonstrates no significant change from  angiography of February 2007.  There was predominantly nonobstructive  calcium of the proximal LAD with about 40% narrowing and 50% narrowing  of the mid-RCA with good residual lumen and a tandem 30 to  40% eccentric  lesion.  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  Etiology of the patient's chest  pain is not clear.  I will reassure him that it is not felt to be  coronary at this time.  Since he does have lesions in the right coronary  artery and LAD with calcium, it might be considered to do outpatient  Cardiolite testing to rule out any exercised-induce ischemia in this  setting since these lesions appear to be noncritical and unchanged.  I  also recommend and encourage PPI therapy and continued medical therapy.   CATHETERIZATION DIAGNOSES:  1. Chest pain, etiology not determined.  2. Noncritical coronary disease with no significant change from prior      catheterization May 20, 2005, with normal left ventricular      function.  3. 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  4. Transient paroxysmal atrial fibrillation on prior admission      February 2007.  5. Hyperlipidemia.  6. Possible gastroesophageal reflux disease.  7. Past cigarette abuse, discontinued 1 to 2 years ago.      Richard A. Alanda Amass, M.D.  Electronically Signed     RAW/MEDQ  D:  01/12/2007  T:  01/13/2007  Job:  [id number]   cc:   Madaline Savage, M.D.  [name], M.D.  CP Lab

## 2010-08-13 NOTE — Discharge Summary (Signed)
[name], [name] NO.:  [id number]   MEDICAL RECORD NO.:  [id number]          PATIENT TYPE:  INP   LOCATION:  2037                         FACILITY:  [facility]   PHYSICIAN:  Gerald [name], M.D.DATE OF BIRTH:  Oct 20, 1940   DATE OF ADMISSION:  05/19/2005  DATE OF DISCHARGE:  05/22/2005                                 DISCHARGE SUMMARY   [name] is a 70 year old [place] male, he was brought to the ER by his  son, the patient speaks no English so everything was translated through his  son.  He had onset of chest discomfort and not feeling well at about 9:30 in  the morning he laid down for several hours, it did not go away, thus he was  brought to the emergency room.  He was seen by Dr. Elsie [name].  His EKG showed  atrial fibrillation with a rapid ventricular rate of 140, he had mild ST  depression diffusely, no obvious MI.  It was decided to put him on IV  heparin, IV diltiazem, he was started on Digoxin and a p.o. beta-blocker.  It was decided that he should undergo cardiac catheterization and this was  performed on May 20, 2005 by Dr. Lenise [name].  He did have coronary  disease, but it was all nonobstructive, he had 50% in his RCA, 30-40%  diffusely in his LAD, his circumflex was without any disease, his EF was  60%, he was __________The following day his blood pressure was fairly low  98/58, he had received only metoprolol one dose.  His order was to hold if  his heart rate was 55 and his heart rate was 54, it was decided by Dr. Tresa [name]  that we would discontinue his Digoxin and try to increase his metoprolol and  change him to Toprol XL 25 mg every day.  It was decided that he should be  kept in the hospital another day.  He was on IV heparin and to Coumadin,  however, he was back in sinus rhythm.  The son told me that his illness was  sudden onset and otherwise he had been in generally normal state of health,  and he is apparently active.  On the  morning of May 22, 2005, he was  still in sinus rhythm, rate of 63, his blood pressure was 98/65, he was  tolerating his medications, his total cholesterol was 165, LDL 109, HDL 41,  triglycerides 73, his hemoglobin was 12.8, hematocrit 37, platelets 172 and  WBC 9.6.  His sodium was 138, potassium 3.3, he was given 40 mEq of KCl, his  BUN was 12, creatinine 0.9 and his glucose was 99, INR 1.9.  His heparin was  discontinued, he was given 2.5 mg of Coumadin prior to discharge and he was  discharged home in stable condition.   DISCHARGE MEDICATIONS:  1.  Aspirin 81 mg daily.  2.  Warfarin 2.5 mg daily at about 6 p.m.  3.  Toprol XL 25 mg daily.  4.  Lipitor 10 mg daily.   He should do no strenuous activity  for three days, no sitting in a tube of  water for seven days.  It was explained to his son about how to check his  Coumadin, that he would have to go to the lab at least once a week for the  first month until his Coumadin dosage was stable.  The son was given also  written instructions and a pamphlet, and he did seem to understand.   DISCHARGE DIAGNOSES:  1.  Chest pain, not coronary ischemia status post catheterization with      nonobstructive coronary artery disease.  2.  Atrial fibrillation with __________ response.  This probably caused his      chest pain.  At the time of discharge he was back in sinus rhythm.  3.  Hyperlipidemia, LDL 109, goal is less than 70.  4.  Hypotension.   He will see Dr. Elsie [name] in approximately two weeks, they will call our office  for an appointment on Monday.      Gerald [name], N.P.    ______________________________  Gerald [name], M.D.    BB/MEDQ  D:  05/22/2005  T:  05/23/2005  Job:  [id number]

## 2010-08-13 NOTE — Cardiovascular Report (Signed)
NAMEKEGHAN, MCFARREN                   ACCOUNT NO.:  [id number]   MEDICAL RECORD NO.:  [id number]          PATIENT TYPE:  INP   LOCATION:  2920                         FACILITY:  [facility]   PHYSICIAN:  Darlin Priestly, MD  DATE OF BIRTH:  1941/02/07   DATE OF PROCEDURE:  05/20/2005  DATE OF DISCHARGE:                              CARDIAC CATHETERIZATION   PROCEDURES:  1.  Left heart catheterization.  2.  Coronary angiography.  3.  Left ventriculogram.  4.  Closure of right femoral site using a StarClose device.   ATTENDING:  Darlin Priestly, MD   COMPLICATIONS:  None.   INDICATION:  Mr. Gunnoe is a 70 year old [place] male who speaks no  [place], who was admitted on May 19, 2005 with new-onset atrial  fibrillation and a complaint of shortness of breath and chest pain.  He did  subsequently rule out for a myocardial infarction.  He subsequently  converted to sinus rhythm with IV Cardizem.  He is now brought for a cardiac  catheterization to rule out a significant CAD prior to possible Coumadin  initiation.   DESCRIPTION OF OPERATION:  After giving informed written consent, the  patient was brought to the cardiac cath lab where the right and left groin  were shaved, prepped and draped in the usual sterile fashion.  ECG  monitoring was established.  Using a modified Seldinger technique, a #6-  [place] arterial sheath was inserted in the right femoral artery.  The 6-  French diagnostic catheters were then used to perform diagnostic  angiography.   The left main is a large vessel with no significant disease.   The LAD is a large vessel coursing to the apex and gives rise to 2 diagonal  branches.  The LAD is noted to have calcification in its proximal segment  with up to 30% disease between the first and second diagonals.  There is a  long 40% mid LAD stenotic lesion.  The remainder of the LAD does not appear  to have any significant disease.   The first and second diagonals  are small vessels with no significant  disease.   The left circumflex is a large vessel coursing to the AV groove and gives  rise to 3 obtuse marginal branches.  The AV groove circumflex has no  significant disease.   The first OM is a large vessel which bifurcates distally with no significant  disease.   The second and third obtuse marginals are small vessels with no significant  disease.   The right coronary artery is a large vessel and is dominant and gives rise  to both the PDA as well as posterolateral branch.  The RCA is noted to have  a long 50% tubular lesion; however, there appears to be adequate lumen  through this area.  The remainder of the RCA does not appear to have any  significant disease.  The PDA and posterolateral branch have no significant  disease.   LV reveals normal EF of approximately 60%.   HEMODYNAMICS:  Systemic arterial pressure 110/64, LV systemic pressure 104/6  with an LVEDP of 11.   CLOSURE:  Following the conclusion of the case, the right femoral site was  then closed using a StarClose device without complication.  One gram of  Ancef was given prophylactically.   CONCLUSIONS:  1.  No significant coronary artery disease.  2.  Normal left ventricular systolic function.  3.  Successful closure of the right groin with a StarClose device with 1 g      of Ancef given prophylactically.      Darlin Priestly, MD  Electronically Signed     RHM/MEDQ  D:  05/20/2005  T:  05/21/2005  Job:  [id number]   cc:   Madaline Savage, M.D.  Fax: [phone]

## 2010-12-27 DIAGNOSIS — C3490 Malignant neoplasm of unspecified part of unspecified bronchus or lung: Secondary | ICD-10-CM | POA: Insufficient documentation

## 2010-12-27 HISTORY — DX: Malignant neoplasm of unspecified part of unspecified bronchus or lung: C34.90

## 2010-12-27 LAB — CBC
HCT: 42.5
Hemoglobin: 14.5
MCHC: 34
MCV: 93.9
Platelets: 174
RBC: 4.53
RDW: 13.6
WBC: 5.8

## 2010-12-27 LAB — DIFFERENTIAL
Basophils Absolute: 0
Basophils Relative: 1
Eosinophils Absolute: 0.4
Eosinophils Relative: 6 — ABNORMAL HIGH
Lymphocytes Relative: 32
Lymphs Abs: 1.9
Monocytes Absolute: 0.5
Monocytes Relative: 8
Neutro Abs: 3.1
Neutrophils Relative %: 53

## 2010-12-27 LAB — BASIC METABOLIC PANEL
BUN: 14
CO2: 21
Calcium: 9.5
Chloride: 109
Creatinine, Ser: 1.02
GFR calc Af Amer: >60
GFR calc non Af Amer: >60
Glucose, Bld: 110 — ABNORMAL HIGH
Potassium: 3.6
Sodium: 140

## 2010-12-27 LAB — POCT I-STAT, CHEM 8
BUN: 15
Calcium, Ion: 1.12
Chloride: 110
Creatinine, Ser: 1.1
Glucose, Bld: 114 — ABNORMAL HIGH
HCT: 43
Hemoglobin: 14.6
Potassium: 3.6
Sodium: 142
TCO2: 22

## 2010-12-27 LAB — APTT: aPTT: 38 — ABNORMAL HIGH

## 2010-12-27 LAB — POCT CARDIAC MARKERS
CKMB, poc: 4.1
CKMB, poc: 8.4
Myoglobin, poc: 193
Myoglobin, poc: 89.4
Troponin i, poc: <0.05
Troponin i, poc: <0.05

## 2010-12-27 LAB — PROTIME-INR
INR: 0.9
Prothrombin Time: 12.3

## 2011-01-05 LAB — POCT CARDIAC MARKERS
CKMB, poc: 4.5
CKMB, poc: 5
Myoglobin, poc: 113
Myoglobin, poc: 150
Operator id: 146091
Operator id: 285841
Troponin i, poc: <0.05
Troponin i, poc: <0.05

## 2011-01-05 LAB — COMPREHENSIVE METABOLIC PANEL
ALT: 24
AST: 26
Albumin: 3.7
Alkaline Phosphatase: 70
BUN: 9
CO2: 28
Calcium: 8.9
Chloride: 105
Creatinine, Ser: 1.04
GFR calc Af Amer: >60
GFR calc non Af Amer: >60
Glucose, Bld: 100 — ABNORMAL HIGH
Potassium: 3.7
Sodium: 137
Total Bilirubin: 0.9
Total Protein: 7

## 2011-01-05 LAB — I-STAT 8, (EC8 V) (CONVERTED LAB)
BUN: 11
Bicarbonate: 27 — ABNORMAL HIGH
Chloride: 106
Glucose, Bld: 105 — ABNORMAL HIGH
HCT: 49
Hemoglobin: 16.7
Operator id: 285841
Potassium: 3.5
Sodium: 143
TCO2: 29
pCO2, Ven: 49.8
pH, Ven: 7.343 — ABNORMAL HIGH

## 2011-01-05 LAB — DIFFERENTIAL
Basophils Absolute: 0
Basophils Relative: 0
Eosinophils Absolute: 0.4
Eosinophils Relative: 6 — ABNORMAL HIGH
Lymphocytes Relative: 33
Lymphs Abs: 2.1
Monocytes Absolute: 0.5
Monocytes Relative: 9
Neutro Abs: 3.3
Neutrophils Relative %: 52

## 2011-01-05 LAB — PROTIME-INR
INR: 0.9
Prothrombin Time: 12.7

## 2011-01-05 LAB — LIPID PANEL
Cholesterol: 151
HDL: 44
LDL Cholesterol: 90
Total CHOL/HDL Ratio: 3.4
Triglycerides: 86
VLDL: 17

## 2011-01-05 LAB — BASIC METABOLIC PANEL
BUN: 12
CO2: 29
Calcium: 8.9
Chloride: 107
Creatinine, Ser: 1.07
GFR calc Af Amer: >60
GFR calc non Af Amer: >60
Glucose, Bld: 100 — ABNORMAL HIGH
Potassium: 3.8
Sodium: 141

## 2011-01-05 LAB — CBC
HCT: 39.3
HCT: 43.2
Hemoglobin: 13.4
Hemoglobin: 14.7
MCHC: 34
MCHC: 34
MCV: 92.7
MCV: 92.9
Platelets: 172
Platelets: 186
RBC: 4.22
RBC: 4.66
RDW: 14.2 — ABNORMAL HIGH
RDW: 14.3 — ABNORMAL HIGH
WBC: 6.4
WBC: 6.6

## 2011-01-05 LAB — CK TOTAL AND CKMB (NOT AT ARMC)
CK, MB: 4.5 — ABNORMAL HIGH
Relative Index: 1.8
Total CK: 249 — ABNORMAL HIGH

## 2011-01-05 LAB — TROPONIN I: Troponin I: 0.02

## 2011-01-05 LAB — POCT I-STAT CREATININE
Creatinine, Ser: 1.1
Operator id: 285841

## 2011-01-05 LAB — APTT: aPTT: 46 — ABNORMAL HIGH

## 2011-01-05 LAB — TSH: TSH: 1.645

## 2011-01-19 ENCOUNTER — Other Ambulatory Visit (HOSPITAL_COMMUNITY): Payer: Self-pay | Admitting: Cardiovascular Disease

## 2011-01-19 DIAGNOSIS — R55 Syncope and collapse: Secondary | ICD-10-CM

## 2011-01-22 ENCOUNTER — Emergency Department (HOSPITAL_COMMUNITY): Payer: Medicare Other

## 2011-01-22 ENCOUNTER — Inpatient Hospital Stay (HOSPITAL_COMMUNITY)
Admission: EM | Admit: 2011-01-22 | Discharge: 2011-01-26 | DRG: 312 | Disposition: A | Discharge status: Home / Self Care | Payer: Medicare Other | Attending: Internal Medicine | Admitting: Internal Medicine

## 2011-01-22 DIAGNOSIS — I4891 Unspecified atrial fibrillation: Secondary | ICD-10-CM | POA: Diagnosis present

## 2011-01-22 DIAGNOSIS — I498 Other specified cardiac arrhythmias: Secondary | ICD-10-CM | POA: Diagnosis not present

## 2011-01-22 DIAGNOSIS — R55 Syncope and collapse: Principal | ICD-10-CM | POA: Diagnosis present

## 2011-01-22 DIAGNOSIS — E785 Hyperlipidemia, unspecified: Secondary | ICD-10-CM | POA: Diagnosis present

## 2011-01-22 DIAGNOSIS — Z7982 Long term (current) use of aspirin: Secondary | ICD-10-CM

## 2011-01-22 DIAGNOSIS — Z8611 Personal history of tuberculosis: Secondary | ICD-10-CM

## 2011-01-22 DIAGNOSIS — R791 Abnormal coagulation profile: Secondary | ICD-10-CM | POA: Diagnosis present

## 2011-01-22 DIAGNOSIS — Z87891 Personal history of nicotine dependence: Secondary | ICD-10-CM

## 2011-01-22 DIAGNOSIS — C342 Malignant neoplasm of middle lobe, bronchus or lung: Secondary | ICD-10-CM | POA: Diagnosis present

## 2011-01-22 DIAGNOSIS — Z79899 Other long term (current) drug therapy: Secondary | ICD-10-CM

## 2011-01-22 DIAGNOSIS — Z7901 Long term (current) use of anticoagulants: Secondary | ICD-10-CM

## 2011-01-22 DIAGNOSIS — Z23 Encounter for immunization: Secondary | ICD-10-CM

## 2011-01-22 DIAGNOSIS — I2510 Atherosclerotic heart disease of native coronary artery without angina pectoris: Secondary | ICD-10-CM | POA: Diagnosis present

## 2011-01-22 LAB — BASIC METABOLIC PANEL
BUN: 13 mg/dL (ref 6–23)
CO2: 26 mEq/L (ref 19–32)
Calcium: 9.1 mg/dL (ref 8.4–10.5)
Chloride: 105 mEq/L (ref 96–112)
Creatinine, Ser: 0.95 mg/dL (ref 0.50–1.35)
GFR calc Af Amer: >90 mL/min (ref >90)
GFR calc non Af Amer: 82 mL/min — ABNORMAL LOW (ref >90)
Glucose, Bld: 102 mg/dL — ABNORMAL HIGH (ref 70–99)
Potassium: 3.6 mEq/L (ref 3.5–5.1)
Sodium: 138 mEq/L (ref 135–145)

## 2011-01-22 LAB — CBC
HCT: 38.1 % — ABNORMAL LOW (ref 39.0–52.0)
Hemoglobin: 13.3 g/dL (ref 13.0–17.0)
MCH: 31.6 pg (ref 26.0–34.0)
MCHC: 34.9 g/dL (ref 30.0–36.0)
MCV: 90.5 fL (ref 78.0–100.0)
Platelets: 171 10*3/uL (ref 150–400)
RBC: 4.21 MIL/uL — ABNORMAL LOW (ref 4.22–5.81)
RDW: 13.2 % (ref 11.5–15.5)
WBC: 6.9 10*3/uL (ref 4.0–10.5)

## 2011-01-22 LAB — POCT I-STAT TROPONIN I: Troponin i, poc: 0 ng/mL (ref 0.00–0.08)

## 2011-01-22 LAB — PROTIME-INR
INR: 1.16 (ref 0.00–1.49)
Prothrombin Time: 15 seconds (ref 11.6–15.2)

## 2011-01-22 LAB — DIFFERENTIAL
Basophils Absolute: 0 10*3/uL (ref 0.0–0.1)
Basophils Relative: 0 % (ref 0–1)
Eosinophils Absolute: 0.4 10*3/uL (ref 0.0–0.7)
Eosinophils Relative: 6 % — ABNORMAL HIGH (ref 0–5)
Lymphocytes Relative: 23 % (ref 12–46)
Lymphs Abs: 1.6 10*3/uL (ref 0.7–4.0)
Monocytes Absolute: 0.8 10*3/uL (ref 0.1–1.0)
Monocytes Relative: 11 % (ref 3–12)
Neutro Abs: 4.1 10*3/uL (ref 1.7–7.7)
Neutrophils Relative %: 59 % (ref 43–77)

## 2011-01-23 ENCOUNTER — Emergency Department (HOSPITAL_COMMUNITY): Payer: Medicare Other

## 2011-01-23 LAB — CARDIAC PANEL(CRET KIN+CKTOT+MB+TROPI)
CK, MB: 2.3 ng/mL (ref 0.3–4.0)
Relative Index: 1.8 (ref 0.0–2.5)
Total CK: 130 U/L (ref 7–232)
Troponin I: <0.3 ng/mL (ref <0.30)

## 2011-01-23 LAB — MAGNESIUM: Magnesium: 1.8 mg/dL (ref 1.5–2.5)

## 2011-01-23 LAB — COMPREHENSIVE METABOLIC PANEL
ALT: 9 U/L (ref 0–53)
AST: 22 U/L (ref 0–37)
Albumin: 3.3 g/dL — ABNORMAL LOW (ref 3.5–5.2)
Alkaline Phosphatase: 54 U/L (ref 39–117)
BUN: 12 mg/dL (ref 6–23)
CO2: 23 mEq/L (ref 19–32)
Calcium: 9 mg/dL (ref 8.4–10.5)
Chloride: 107 mEq/L (ref 96–112)
Creatinine, Ser: 0.89 mg/dL (ref 0.50–1.35)
GFR calc Af Amer: >90 mL/min (ref >90)
GFR calc non Af Amer: 84 mL/min — ABNORMAL LOW (ref >90)
Glucose, Bld: 95 mg/dL (ref 70–99)
Potassium: 3.5 mEq/L (ref 3.5–5.1)
Sodium: 139 mEq/L (ref 135–145)
Total Bilirubin: 0.7 mg/dL (ref 0.3–1.2)
Total Protein: 6.6 g/dL (ref 6.0–8.3)

## 2011-01-23 LAB — D-DIMER, QUANTITATIVE (NOT AT ARMC): D-Dimer, Quant: 0.33 ug/mL-FEU (ref 0.00–0.48)

## 2011-01-23 LAB — HEMOGLOBIN A1C
Hgb A1c MFr Bld: 5.8 % — ABNORMAL HIGH (ref <5.7)
Mean Plasma Glucose: 120 mg/dL — ABNORMAL HIGH (ref <117)

## 2011-01-23 LAB — CK TOTAL AND CKMB (NOT AT ARMC)
CK, MB: 2.3 ng/mL (ref 0.3–4.0)
Relative Index: 1.8 (ref 0.0–2.5)
Total CK: 125 U/L (ref 7–232)

## 2011-01-23 LAB — TROPONIN I: Troponin I: <0.3 ng/mL (ref <0.30)

## 2011-01-23 LAB — TSH: TSH: 2.201 u[IU]/mL (ref 0.350–4.500)

## 2011-01-23 LAB — CK: Total CK: 124 U/L (ref 7–232)

## 2011-01-23 MED ORDER — IOHEXOL 300 MG/ML  SOLN
100.0000 mL | Freq: Once | INTRAMUSCULAR | Status: AC | PRN
  Administered 2011-01-23: 100 mL via INTRAVENOUS

## 2011-01-24 ENCOUNTER — Other Ambulatory Visit (HOSPITAL_COMMUNITY): Payer: Medicare Other

## 2011-01-24 LAB — BASIC METABOLIC PANEL
BUN: 15 mg/dL (ref 6–23)
CO2: 24 mEq/L (ref 19–32)
Calcium: 8.7 mg/dL (ref 8.4–10.5)
Chloride: 105 mEq/L (ref 96–112)
Creatinine, Ser: 0.98 mg/dL (ref 0.50–1.35)
GFR calc Af Amer: >90 mL/min (ref >90)
GFR calc non Af Amer: 81 mL/min — ABNORMAL LOW (ref >90)
Glucose, Bld: 100 mg/dL — ABNORMAL HIGH (ref 70–99)
Potassium: 3.5 mEq/L (ref 3.5–5.1)
Sodium: 139 mEq/L (ref 135–145)

## 2011-01-24 LAB — CARDIAC PANEL(CRET KIN+CKTOT+MB+TROPI)
CK, MB: 2.2 ng/mL (ref 0.3–4.0)
Relative Index: 1.9 (ref 0.0–2.5)
Total CK: 117 U/L (ref 7–232)
Troponin I: <0.3 ng/mL (ref <0.30)

## 2011-01-24 LAB — PROTIME-INR
INR: 1.18 (ref 0.00–1.49)
Prothrombin Time: 15.3 seconds — ABNORMAL HIGH (ref 11.6–15.2)

## 2011-01-24 LAB — DIFFERENTIAL
Basophils Absolute: 0 10*3/uL (ref 0.0–0.1)
Basophils Relative: 0 % (ref 0–1)
Eosinophils Absolute: 0.3 10*3/uL (ref 0.0–0.7)
Eosinophils Relative: 3 % (ref 0–5)
Lymphocytes Relative: 16 % (ref 12–46)
Lymphs Abs: 1.4 10*3/uL (ref 0.7–4.0)
Monocytes Absolute: 1 10*3/uL (ref 0.1–1.0)
Monocytes Relative: 11 % (ref 3–12)
Neutro Abs: 6.3 10*3/uL (ref 1.7–7.7)
Neutrophils Relative %: 70 % (ref 43–77)

## 2011-01-24 LAB — CBC
HCT: 35.8 % — ABNORMAL LOW (ref 39.0–52.0)
Hemoglobin: 12.3 g/dL — ABNORMAL LOW (ref 13.0–17.0)
MCH: 31.1 pg (ref 26.0–34.0)
MCHC: 34.4 g/dL (ref 30.0–36.0)
MCV: 90.6 fL (ref 78.0–100.0)
Platelets: 163 10*3/uL (ref 150–400)
RBC: 3.95 MIL/uL — ABNORMAL LOW (ref 4.22–5.81)
RDW: 13 % (ref 11.5–15.5)
WBC: 9 10*3/uL (ref 4.0–10.5)

## 2011-01-25 ENCOUNTER — Other Ambulatory Visit: Payer: Self-pay | Admitting: Interventional Radiology

## 2011-01-25 ENCOUNTER — Other Ambulatory Visit (HOSPITAL_COMMUNITY): Payer: Self-pay

## 2011-01-25 ENCOUNTER — Inpatient Hospital Stay (HOSPITAL_COMMUNITY): Payer: Medicare Other

## 2011-01-25 LAB — CBC
HCT: 36.8 % — ABNORMAL LOW (ref 39.0–52.0)
Hemoglobin: 12.9 g/dL — ABNORMAL LOW (ref 13.0–17.0)
MCH: 31.9 pg (ref 26.0–34.0)
MCHC: 35.1 g/dL (ref 30.0–36.0)
MCV: 91.1 fL (ref 78.0–100.0)
Platelets: 160 10*3/uL (ref 150–400)
RBC: 4.04 MIL/uL — ABNORMAL LOW (ref 4.22–5.81)
RDW: 13.1 % (ref 11.5–15.5)
WBC: 7.2 10*3/uL (ref 4.0–10.5)

## 2011-01-25 LAB — PROTIME-INR
INR: 1.07 (ref 0.00–1.49)
Prothrombin Time: 14.1 seconds (ref 11.6–15.2)

## 2011-01-25 MED ORDER — IOHEXOL 300 MG/ML  SOLN
75.0000 mL | Freq: Once | INTRAMUSCULAR | Status: AC | PRN
  Administered 2011-01-25: 75 mL via INTRAVENOUS

## 2011-01-26 LAB — PROTIME-INR
INR: 0.99 (ref 0.00–1.49)
Prothrombin Time: 13.3 seconds (ref 11.6–15.2)

## 2011-01-26 LAB — CBC
HCT: 35 % — ABNORMAL LOW (ref 39.0–52.0)
Hemoglobin: 12 g/dL — ABNORMAL LOW (ref 13.0–17.0)
MCH: 31.7 pg (ref 26.0–34.0)
MCHC: 34.3 g/dL (ref 30.0–36.0)
MCV: 92.3 fL (ref 78.0–100.0)
Platelets: 164 10*3/uL (ref 150–400)
RBC: 3.79 MIL/uL — ABNORMAL LOW (ref 4.22–5.81)
RDW: 13.4 % (ref 11.5–15.5)
WBC: 7.3 10*3/uL (ref 4.0–10.5)

## 2011-01-26 NOTE — Discharge Summary (Signed)
Gerald [name], Gerald [name] NO.:  [id number]  MEDICAL RECORD NO.:  [id number]  LOCATION:  3737                         FACILITY:  [facility]  PHYSICIAN:  Gerald Massed, MD    DATE OF BIRTH:  [birth year]  DATE OF ADMISSION:  01/22/2011 DATE OF DISCHARGE:                        DISCHARGE SUMMARY - REFERRING   PRIMARY CARE PRACTITIONER:  Gerald Joe, MD at [name].  PRIMARY CARDIOLOGIST:  Gerald [name].  PRIMARY DISCHARGE DIAGNOSES: 1. Presyncope, workup so far negative. 2. Lung masses with mediastinal lymphadenopathy-highly suspicious for     primary lung cancer. 3. Runs of supraventricular tachycardia and paroxysmal atrial     fibrillation during this hospital stay.  SECONDARY DISCHARGE DIAGNOSES/PAST MEDICAL HISTORY: 1. History of paroxysmal atrial fibrillation. 2. Chronic Coumadin therapy, however, INR subtherapeutic on admission. 3. History of dyslipidemia. 4. History of nonobstructive coronary artery disease.  DISCHARGE MEDICATIONS: 1. Lovenox 70 mg subcutaneously twice daily. 2. Lopressor 12.5 mg p.o. twice daily. 3. Aspirin 81 mg 1 tablet daily. 4. Zocor 40 mg 1 tablet daily at bedtime. 5. Vitamin D3, 1000 units 1 tablet p.o. daily. 6. Coumadin 4 mg 1 tablet every morning.  CONSULTANTS:  On the case, 1. Gerald [name]. 2. Interventional Radiology for CT-guided right middle lobe mass     biopsy.  BRIEF HISTORY:  [name] is a 70 year old [place] male, who speaks no English, was actually admitted by [name] for presyncope.  Further workup demonstrated extensive lung masses and mediastinal adenopathy on a CT angiogram of the chest.  He was then transferred to the hospitalist service for further evaluation and treatment.  For further details regarding the history and physical, please see the note that was dictated by Dr. Tresa [name] on admission.  PERTINENT RADIOLOGICAL  STUDIES: 1. CT angiogram of the chest done on January 23, 2011, showed no     evidence of pulmonary embolism.  Three right middle lobe masses and     extensive hilar/mediastinal lymphadenopathy.  This appearance is     highly suspicious for primary bronchogenic carcinoma. 2. CT of the head with and without contrast was negative for     metastatic disease of the brain.  PROCEDURES PERFORMED:  The patient underwent a CT-guided biopsy of the right middle lobe mass.  This was done on January 25, 2011.  Biopsy results are currently pending.  PERTINENT LABORATORY DATA: 1. Cardiac enzymes were cycled and these were negative. 2. D-dimer was 0.33.  BRIEF HOSPITAL COURSE: 1. Right lung mass with hilar and mediastinal lymphadenopathy.  As     noted above, the patient was admitted with presyncope and admitted     by the Cardiology Service, a CT angiogram of the chest ordered by     Cardiology did not show any pulmonary emboli, however, it showed     the incidental findings of right lung mass with extensive     mediastinal lymphadenopathy.  Upon discovery of these findings, the     patient was then transferred to the hospitalist service.  I then     reviewed the images with Dr. Delton [name] from Pulmonary Critical Care  Medicine and it was best decided to see if we could do a CT-guided     needle biopsy of the most peripheral lesion before choosing more     aggressive means of biopsy.  Interventional Radiology was     subsequently consulted and the patient underwent the procedure     today.  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     However, given the patient's constellation of findings, it is     highly suspicious that this is more of malignant in nature.  I have     contacted the cancer center and passed on the patient's     demographics and cell phone numbers of the patient's 2 daughters to     them, the  cancer center is then going to call the patient for     followup appointment.  The patient also has a appointment with his     primary care practitioner this coming Friday on the January 28, 2011 at 1:45 p.m., where the biopsies results could also be     followed up.  He will need subsequent referral to the cancer center     if he already does not have a follow up appointment with them by     then. 2. History of paroxysmal atrial fibrillation.  During his stay here,     the patient was noted to have runs of what looks like SVT and was     placed on beta-blockers.  His anticoagulation was on hold because     of the pending lung biopsy.  On discharge, he will resume Lovenox     and Coumadin and discontinue Lovenox when the INR levels are     therapeutic. 3. History of nonobstructive coronary artery disease.  The patient is     to resume aspirin and statin.  DISPOSITION:  The patient will likely be discharged on January 26, 2011, with plans to follow up biopsy results either at the cancer center or at his primary care's office at his next followup appointment, which is on January 28, 2011, at 1:45 p.m.  FOLLOWUP INSTRUCTIONS: 1. The patient will need INR monitoring and Coumadin dose adjustment,     this will need to be done at the patient's primary care's office of     G[name]. 2. The patient will follow up with Dr. Azucena [name] at [place] at [facility] this coming Friday at 1:45 p.m.  He will then subsequently     likely need to be referred to the cancer center.  Please note that     if his biopsy results are inconclusive, then he will need to be     referred to [facility] for endoscopic guided     and bronchoscopy and subsequent biopsy of the mediastinal masses. 3. The patient will need referral to the cancer center.  If there are any changes to the patient's hospital course, and the discharge medications a brief addendum will be  done by the discharging physician.  Total time spent for discharge 45 minutes.     Gerald Massed, MD     SG/MEDQ  D:  01/25/2011  T:  01/25/2011  Job:  [id number]  cc:   Lajuana Matte, M.D. Gerald [name], M.D. [name]  Electronically Signed by Gerald [name]  on 01/26/2011 12:55:45 PM

## 2011-01-26 NOTE — H&P (Signed)
NAMEMARLEE, TRENTMAN NO.:  [id number]  MEDICAL RECORD NO.:  [id number]  LOCATION:  [facility]                         FACILITY:  [facility]  PHYSICIAN:  Gerald [name], M.D.     DATE OF BIRTH:  [birth year]  DATE OF ADMISSION:  01/23/2011 DATE OF DISCHARGE:                             HISTORY & PHYSICAL   CHIEF COMPLAINT:  Presyncope.  HISTORY OF PRESENT ILLNESS:  Gerald [name] is a pleasant 70 year old [place] male who was accompanied to the emergency department last evening with a family member with complaints of dizziness and presyncope.  He has a history of paroxysmal atrial fibrillation and underwent DC cardioversion in June 2011 with no recurrent symptoms per the patient.  He does not speak English, therefore his family does translate for him.  He also has a history of noncritical coronary artery disease by cardiac catheterization in 2008 with normal LV function.  He is also on Coumadin therapy.  His symptoms began approximately 1 week ago.  He noticed between 4 -7 am after arising he would experience sudden onset of lightheadedness and dizziness.  He would have to hold onto something and eventually sit down or he felt he would pass out if he did not sit down . He has not lost consciousness with these episodes, but felt very close to it.  With these episodes, he also felt a strong urge to defecate, but has not.  He has not experienced any chest pain with these episodes.  No shortness of breath.  He denies any sensation of palpitations or tachycardias.  Although in reviewing his medical records, it appears that he did experience some weakness and dizziness with his AFib in the past.  On arrival to the emergency department, his EKG revealed normal sinus rhythm without ischemic changes.  No ectopic beats.  His troponin was negative.  His INR was subtherapeutic at 1.16. His chest x-ray revealed a right middle lung mass which was thought to be secondary to pneumonia.   However, at this time, CT is pending. Currently, he has no complaints.  He has not experienced any cough, no wheezing, no fevers or chills, no rigors.  No reflux or GERD like symptoms per the family.  He does report to his family member that his presyncopal symptoms last about 10 minutes and they go away very slowly. He has to remain very still and quiet and they would go away. Currently, he feels well.  He has not had any symptoms this morning.  He has not noted any of these symptoms at any other times during the day. There is no particular activity that brings them on.  He has experienced them while up and moving around and also while sitting down, but exercising.  Currently, he has no complaints.  PAST MEDICAL HISTORY: 1. Coronary atherosclerotic heart disease.     a.     Status post cardiac catheterizations in 2008 revealing      noncritical coronary artery disease. 2. Paroxysmal atrial fibrillation. 3. History of tobacco use. 4. Dyslipidemia.  FAMILY HISTORY:  Noncontributory.  SOCIAL HISTORY:  He quit smoking 4-5 years ago.  Occasionally drinks alcohol.  ALLERGIES:  None known.  CURRENT MEDICATIONS: 1. Aspirin 81 mg daily. 2. Simvastatin 40 mg nightly. 3. Vitamin D 1000 units daily. 4. Coumadin 4 mg every morning.  REVIEW OF SYSTEMS:  As per HPI, otherwise unable to obtain.  PHYSICAL EXAM:  VITAL SIGNS:  Blood pressure is 136/81, pulse is 58 and regular, and respirations 16. GENERAL:  This is a pleasant [place] male in no acute distress. HEENT:  Pupils are equal and reactive to light and accommodation. Extraocular movements are intact. NECK:  Supple.  No JVD.  Carotid bruits noted. CARDIOVASCULAR:  Regular rate and rhythm.  S1 and S2 without appreciable murmur, gallop, or rub. LUNGS:  Clear to auscultation bilaterally.  Normal respiratory effort. ABDOMEN:  Soft and nontender.  No hepatosplenomegaly or masses. EXTREMITIES:  Femoral and dorsal pedal arteries are  present.  No lower extremity edema.  No clubbing, cyanosis, or ulcers. NEUROLOGIC:  Oriented to person, place, and time.  Normal mood and affect.  LABORATORY DATA:  EKG reveals normal sinus rhythm.  Chest x-ray reveals consolidation in the right middle lung, possibly due to pneumonia.  BMET was normal with the exception of glucose of 102.  PT is 15 and INR is 1.16.  Troponin is 0.02.  CBC revealed hematocrit of 38.1, hemoglobin was 13.3, and white count was 6.9.  IMPRESSION: 1. Dizziness/presyncope. 2. History of paroxysmal atrial fibrillation. 3. Coumadin therapy, subtherapeutic. 4. Treated dyslipidemia. 5. Nonobstructive coronary artery disease by cath in 2008.  PLAN:  We will admit to telemetry.  We will rule out for myocardial infarction with cardiac enzymes.  We will check a D-dimer, TSH, hemoglobin A1c, and magnesium.  We will also go ahead and check a CT of the chest and also get an echocardiogram.  We will ask Pharmacy to dose his Coumadin and begin Lovenox b.i.d. to bridge given his subtherapeutic INR.    ______________________________ Gerald College, NP   ______________________________ Gerald [name], M.D.    LS/MEDQ  D:  01/23/2011  T:  01/23/2011  Job:  [id number]  cc:   [facility]  Electronically Signed by Charmian Muff NP on 01/23/2011 09:57:26 PM Electronically Signed by Gerald [name] M.D. on 01/26/2011 01:29:04 PM

## 2011-01-27 ENCOUNTER — Encounter: Payer: Medicare Other | Admitting: Oncology

## 2011-01-27 ENCOUNTER — Telehealth: Payer: Self-pay | Admitting: Oncology

## 2011-01-27 NOTE — Telephone Encounter (Signed)
lmonvm for the pt's daughter kim to call me back to confirm the pt's appt for 02/01/2011.

## 2011-01-29 ENCOUNTER — Telehealth: Payer: Self-pay | Admitting: Oncology

## 2011-01-29 NOTE — Telephone Encounter (Signed)
S/w the pt's daughter kim and she is aware of the new pt appt with dr ha on 02/01/2011@9 :30am. Asked if the pt needed an interpreter per pt's dtr no interpreter is needed at this appt.

## 2011-01-29 NOTE — Discharge Summary (Signed)
  [name], [name] NO.:  [id number]  MEDICAL RECORD NO.:  [id number]  LOCATION:  3737                         FACILITY:  [facility]  PHYSICIAN:  Lonia Blood, M.D.       DATE OF BIRTH:  23-May-1940  DATE OF ADMISSION:  01/22/2011 DATE OF DISCHARGE:  01/26/2011                              DISCHARGE SUMMARY   ADDENDUM  PRIMARY CARE PHYSICIAN:  [name], MD  DISCHARGE DIAGNOSES: 1. Small cell lung cancer. 2. Rest of the discharge diagnoses are the same as in previously     dictated discharge summary.  DISCHARGE MEDICATIONS:  The following correction has been applied.  The patient was taken off of Lovenox and Coumadin and placed on aspirin alone and he was continued on Lopressor, Zocor and vitamin D.  FOLLOWUP:  [name] will follow up with his primary care physician and with Dr. Si Gaul from Oncology.  For complete list of hospital course, refer to previously dictated discharge summary done by Dr. Jerral Ralph.  Since the previous dictation, [name] has been reevaluated by [facility].  They recommend take him off of Lovenox and Coumadin and place the patient on aspirin alone given the fact that he has small-cell lung cancer.  He is probably going to require chemo or radiation and surgical evaluation. Otherwise, [name] has been deemed stable for discharge and he will follow up with his primary care physician, and Dr. Arbutus Ped from Oncology.     Lonia Blood, M.D.     SL/MEDQ  D:  01/28/2011  T:  01/28/2011  Job:  [id number]  cc:   [name], M.D.  Electronically Signed by Lonia Blood M.D. on 01/29/2011 05:44:28 PM

## 2011-01-30 ENCOUNTER — Encounter: Payer: Self-pay | Admitting: Oncology

## 2011-01-30 DIAGNOSIS — F17200 Nicotine dependence, unspecified, uncomplicated: Secondary | ICD-10-CM | POA: Insufficient documentation

## 2011-01-30 DIAGNOSIS — E785 Hyperlipidemia, unspecified: Secondary | ICD-10-CM | POA: Insufficient documentation

## 2011-02-01 ENCOUNTER — Encounter: Payer: Self-pay | Admitting: *Deleted

## 2011-02-01 ENCOUNTER — Ambulatory Visit (HOSPITAL_BASED_OUTPATIENT_CLINIC_OR_DEPARTMENT_OTHER): Payer: Medicare Other

## 2011-02-01 ENCOUNTER — Ambulatory Visit (HOSPITAL_BASED_OUTPATIENT_CLINIC_OR_DEPARTMENT_OTHER): Payer: Medicare Other | Admitting: Oncology

## 2011-02-01 ENCOUNTER — Other Ambulatory Visit (HOSPITAL_BASED_OUTPATIENT_CLINIC_OR_DEPARTMENT_OTHER): Payer: Medicare Other | Admitting: Lab

## 2011-02-01 ENCOUNTER — Telehealth: Payer: Self-pay | Admitting: Oncology

## 2011-02-01 DIAGNOSIS — R42 Dizziness and giddiness: Secondary | ICD-10-CM

## 2011-02-01 DIAGNOSIS — C3490 Malignant neoplasm of unspecified part of unspecified bronchus or lung: Secondary | ICD-10-CM

## 2011-02-01 DIAGNOSIS — C342 Malignant neoplasm of middle lobe, bronchus or lung: Secondary | ICD-10-CM

## 2011-02-01 DIAGNOSIS — E785 Hyperlipidemia, unspecified: Secondary | ICD-10-CM

## 2011-02-01 DIAGNOSIS — R51 Headache: Secondary | ICD-10-CM

## 2011-02-01 DIAGNOSIS — I2510 Atherosclerotic heart disease of native coronary artery without angina pectoris: Secondary | ICD-10-CM

## 2011-02-01 DIAGNOSIS — F17200 Nicotine dependence, unspecified, uncomplicated: Secondary | ICD-10-CM

## 2011-02-01 DIAGNOSIS — I4891 Unspecified atrial fibrillation: Secondary | ICD-10-CM

## 2011-02-01 LAB — CBC WITH DIFFERENTIAL/PLATELET
BASO%: 0.3 % (ref 0.0–2.0)
Basophils Absolute: 0 10*3/uL (ref 0.0–0.1)
EOS%: 7.2 % — ABNORMAL HIGH (ref 0.0–7.0)
Eosinophils Absolute: 0.4 10*3/uL (ref 0.0–0.5)
HCT: 39.6 % (ref 38.4–49.9)
HGB: 13.5 g/dL (ref 13.0–17.1)
LYMPH%: 20.4 % (ref 14.0–49.0)
MCH: 32.2 pg (ref 27.2–33.4)
MCHC: 34.2 g/dL (ref 32.0–36.0)
MCV: 94.2 fL (ref 79.3–98.0)
MONO#: 0.4 10*3/uL (ref 0.1–0.9)
MONO%: 6.8 % (ref 0.0–14.0)
NEUT#: 4 10*3/uL (ref 1.5–6.5)
NEUT%: 65.3 % (ref 39.0–75.0)
Platelets: 174 10*3/uL (ref 140–400)
RBC: 4.2 10*6/uL (ref 4.20–5.82)
RDW: 13.8 % (ref 11.0–14.6)
WBC: 6.1 10*3/uL (ref 4.0–10.3)
lymph#: 1.3 10*3/uL (ref 0.9–3.3)

## 2011-02-01 LAB — COMPREHENSIVE METABOLIC PANEL
ALT: 14 U/L (ref 0–53)
AST: 24 U/L (ref 0–37)
Albumin: 4.2 g/dL (ref 3.5–5.2)
Alkaline Phosphatase: 61 U/L (ref 39–117)
BUN: 16 mg/dL (ref 6–23)
CO2: 26 mEq/L (ref 19–32)
Calcium: 9.1 mg/dL (ref 8.4–10.5)
Chloride: 103 mEq/L (ref 96–112)
Creatinine, Ser: 1.09 mg/dL (ref 0.50–1.35)
Glucose, Bld: 99 mg/dL (ref 70–99)
Potassium: 4.1 mEq/L (ref 3.5–5.3)
Sodium: 139 mEq/L (ref 135–145)
Total Bilirubin: 0.3 mg/dL (ref 0.3–1.2)
Total Protein: 7.2 g/dL (ref 6.0–8.3)

## 2011-02-01 MED ORDER — ONDANSETRON HCL 8 MG PO TABS: 8.0000 mg | ORAL_TABLET | Freq: Two times a day (BID) | ORAL | Status: AC | PRN

## 2011-02-01 MED ORDER — PROCHLORPERAZINE MALEATE 10 MG PO TABS: 10.0000 mg | ORAL_TABLET | Freq: Four times a day (QID) | ORAL | Status: AC | PRN

## 2011-02-01 MED ORDER — LORAZEPAM 0.5 MG PO TABS: 0.5000 mg | ORAL_TABLET | Freq: Four times a day (QID) | ORAL | Status: AC | PRN

## 2011-02-01 MED ORDER — MECLIZINE HCL 12.5 MG PO TABS: 12.5000 mg | ORAL_TABLET | Freq: Three times a day (TID) | ORAL | Status: DC | PRN

## 2011-02-01 NOTE — Progress Notes (Signed)
[facility] NEW PATIENT EVALUATION   Name: Gerald [name] Date: 02/01/2011 MRN: [id number] DOB: 1941/02/06    CC: [name], MD    [name], MD  REFERRING PHYSICIAN: [name], MD  REASON FOR REFERRAL:  Newly diagnosed small cell lung cancer   CHIEF COMPLAINT:  Headache and dizziness.    HISTORY OF PRESENT ILLNESS:Gerald [name] is a 70 y.o. male Vietnamese-American gentleman who had history of smoking until a few years ago.  He was in itself health until about 2 weeks ago when he developed intermittent headache and dizziness. He reports of the the headache was described as a crampy patch-like frontal temporal headache in the dizziness was described as mild to moderate. He has been less active compared to before. Over the past few weeks the symptoms have been getting worse. By sitting down and rest,  the symptoms get better.    He presented to the clinic his PCP Dr. Opal Sidles A. and was thought to have cardiac issue and he was admitted. During the admission between 01/23/2011 and 01/28/2011 he had screening chest x-ray and CT angio which found mediastinal adenopathy. I personally reviewed the CT angio performed on 01/23/2011 and showed the pictures to the patient his son and daughter who were with him today.  There were about 2 x 3 cm right middle lobe mass in the central area and fossa and near by presented peripheral right middle lobe mass. There was extensive mediastinal and right hilar adenopathy including right paratracheal, precarinal, subcarinal and right hilar lymph node.  Given the headache and syncope could suspected lung cancer he had CT of the head with and without IV contrast on 01/25/2011 consider myself totally and was negative for intracranial metastases on this head CT.   He presented to today to clinic with his children for evaluation. He reported that he has been having persistent mild headache but headache is is still there other visits as cannula better when  he is on Antivert. He denied any confusion but propria, source of breath, chest pain, hemoptysis, abdominal pain, nausea vomiting, mucositis, probable lymph node swelling, diarrhea, constipation, change leading, low back pain, neuropathy, heat/cold intolerance, depression, feeling hopelessness, and focal motor weakness.   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     given his history of fun you lung cancer he has came off of Coumadin for his age fibrillation.    PAST SURGICAL HISTORY: Past Surgical History  Procedure Date  . Catherization 2008    cardiac catherization     CURRENT MEDICATIONS:  Current Outpatient Prescriptions  Medication Sig Dispense Refill  . aspirin 325 MG tablet Take 325 mg by mouth daily.        . cholecalciferol (VITAMIN D) 1000 UNITS tablet Take 5,000 Units by mouth daily.        . meclizine (ANTIVERT) 12.5 MG tablet Take 1 tablet (12.5 mg total) by mouth 3 (three) times daily as needed for dizziness.  30 tablet  3  . metoprolol tartrate (LOPRESSOR) 25 MG tablet Take 12.5 mg by mouth 2 (two) times daily.        . simvastatin (ZOCOR) 40 MG tablet Take 40 mg by mouth at bedtime.        Marland Kitchen LORazepam (ATIVAN) 0.5 MG tablet Take 1 tablet (0.5 mg total) by mouth every 6 (six) hours as needed for anxiety (nausea/vomiting).  30 tablet  3  . ondansetron (ZOFRAN) 8 MG tablet Take 1 tablet (8 mg total) by mouth every 12 (twelve) hours as needed for nausea.  30 tablet  3  . prochlorperazine (COMPAZINE) 10 MG tablet Take 1 tablet (10 mg total) by mouth every 6 (six) hours as needed (nausea/vomting).  30 tablet  3    ALLERGIES: Review of patient's allergies indicates no known allergies.   SOCIAL HISTORY:  reports that he quit smoking about 4 years ago. His smoking use included Cigarettes. He does not have any smokeless tobacco history on file. He reports that he does  not drink alcohol or use illicit drugs.  he is married and lives with his wife. He has 6 children. To her here with him today one daughter and one son. The daughter works at [employer] office andd the son works in the [employer].   FAMILY HISTORY: Both parents died from old age. His older sister died in the war. His older brother died from unknown cause. He is not aware of any family history of cancer.   LABORATORY DATA:  Results for orders placed in visit on 02/01/11 (from the past 48 hour(s))  CBC WITH DIFFERENTIAL     Status: Abnormal   Collection Time   02/01/11  9:50 AM      Component Value Range Comment   WBC 6.1  4.0 - 10.3 (10e3/uL)    NEUT# 4.0  1.5 - 6.5 (10e3/uL)    HGB 13.5  13.0 - 17.1 (g/dL)    HCT 16.1  09.6 - 04.5 (%)    Platelets 174  140 - 400 (10e3/uL)    MCV 94.2  79.3 - 98.0 (fL)    MCH 32.2  27.2 - 33.4 (pg)    MCHC 34.2  32.0 - 36.0 (g/dL)    RBC 4.09  4.20 - 5.82 (10e6/uL)    RDW 13.8  11.0 - 14.6 (%)    lymph# 1.3  0.9 - 3.3 (10e3/uL)    MONO# 0.4  0.1 - 0.9 (10e3/uL)    Eosinophils Absolute 0.4  0.0 - 0.5 (10e3/uL)    Basophils Absolute 0.0  0.0 - 0.1 (10e3/uL)    NEUT% 65.3  39.0 - 75.0 (%)    LYMPH% 20.4  14.0 - 49.0 (%)    MONO% 6.8  0.0 - 14.0 (%)    EOS% 7.2 (*) 0.0 - 7.0 (%)    BASO% 0.3  0.0 - 2.0 (%)   COMPREHENSIVE METABOLIC PANEL     Status: Normal   Collection Time   02/01/11  9:50 AM      Component Value Range Comment   Sodium 139  135 - 145 (mEq/L)    Potassium 4.1  3.5 - 5.3 (mEq/L)    Chloride 103  96 - 112 (mEq/L)    CO2 26  19 - 32 (mEq/L)    Glucose, Bld 99  70 - 99 (mg/dL)    BUN 16  6 - 23 (mg/dL)    Creatinine, Ser 8.11  0.50 - 1.35 (mg/dL)    Total Bilirubin 0.3  0.3 - 1.2 (mg/dL)    Alkaline Phosphatase 61  39 - 117 (U/L)    AST 24  0 - 37 (U/L)    ALT 14  0 - 53 (U/L)    Total Protein 7.2  6.0 - 8.3 (g/dL)    Albumin 4.2  3.5 - 5.2 (g/dL)    Calcium 9.1  8.4 - 10.5 (mg/dL)  RADIOGRAPHY: 1.  CT chest angio:   01/23/2011: CHEST CT WITHOUT CONTRAST    Technique:  Multidetector CT imaging of the chest was performed   following the standard protocol without intravenous contrast.    Comparison:  Chest radiograph dated 01/22/2011.  [facility]   chest dated 01/31/2008    Findings:  No evidence of pulmonary embolism.    Paraseptal emphysematous changes.  Stable scarring/calcified   granuloma in the right upper lobe (series 5/image 21).    2.6 x 2.5 cm central right middle lobe mass (series 5/image 56).   3.9 x 2.9 cm peripheral right middle lobe mass/opacity (series   5/image 65).  3.0 x 4.1 cm right middle lobe/cardiophrenic angle   mass (series 5/image 69).    Mediastinal/right hilar lymphadenopathy, including:   --2.2 x 2.7 cm right paratracheal lymph node (series 7/image 29)   --4.0 x 4.7 cm precarinal adenopathy (series 7/image 47   --4.2 x 6.0 cm subcarinal adenopathy (series 7/image 51)   --2.4 x 2.4 cm right hilar node (series 7/image 53)    Precarinal/subcarinal lymphadenopathy is confluent and measures at   least 8.2 cm in craniocaudal dimension (series 606/image 69).    Visualized upper abdomen is unremarkable.    Visualized osseous structures are within normal limits.    IMPRESSION:   No evidence of pulmonary embolism.    Up to 3 right middle lobe masses and extensive right   hilar/mediastinal lymphadenopathy, as described above.  This   appearance is highly suspicious for primary bronchogenic carcinoma   such as small cell carcinoma.   2.  CT head with and without contrast 01/25/2011:     Comparison: CT 01/22/2011    Findings: Ventricle size is normal.  Negative for acute infarct.   Negative for hemorrhage or mass.  No edema is present.    Postcontrast imaging reveals normal enhancement.  No evidence of   metastatic disease.    Calvarium is intact.    IMPRESSION:   Normal for age.  Negative for metastatic disease to the brain.   REVIEW OF SYSTEMS:  Pertinent items are noted in HPI.   PHYSICAL EXAM:  height is 5\' 9"  (1.753 m) and weight is 150 lb 9.6 oz (68.312 kg). His oral temperature is 97 F (36.1 C). His blood pressure is 129/81 and his pulse is 73.  ECOG 0.    General:  well-nourished in no acute distress.  Eyes:  no scleral icterus.  ENT:  There were no oropharyngeal lesions.  Neck was without thyromegaly.  Lymphatics:  Negative cervical, supraclavicular or axillary adenopathy.  Respiratory: lungs were clear bilaterally without wheezing or crackles.  Cardiovascular:  Regular rate and rhythm, S1/S2, without murmur, rub or gallop.  There was no pedal edema.  GI:  abdomen was soft, flat, nontender, nondistended, without organomegaly.  Muscoloskeletal:  no spinal tenderness of palpation of vertebral spine.  Skin exam was without echymosis, petichae.  Neuro exam was nonfocal with strength 5/5; normal tendon reflex.  Patient was able to get on and off exam table without assistance.  Gait was normal.  Patient was alerted and oriented.  Attention was good.   Language was appropriate.  Mood was normal without depression.  Speech was not pressured.  Thought content was not tangential.      A&P:    1.  Small cell lung cancer:  Staging:  I recommended to patient to proceed with MRI of the brain with and without IV contrast to ensure that he  does not have any intracranial metastases from small cell lung cancer.  CT of the brain sometime tenderness small deposits off intracranial metastases.  I went ahead and ordered this today.  I also recommended and ordered a PET whole-body today to ensure that he doesn't have metastatic disease elsewhere.    Prognosis:  I discussed with the patient and his family members that prognosis small cell lung cancer depends on the staging. Staging can either be limited or extensive. If he has extensive disease more than a field of radiation therapy, then it would be extensive stage and not curable.  Even if the cancer  response in limited stage with chemotherapy, there is a high chance of recurrence given small cell lung cancer diagnosis.  The patient and his children "hopeful that hebe limited stage and he'll be cured.  Treatment plan:  I discussed with him that there is no role for surgical resection in small cell lung cancer as the disease is curative with chemotherapy plus minus radiation. If he has limited stage disease then to be concurrent chemoradiotherapy therapy at the second or third cycle of chemotherapy to see how he tolerates the first few cycles.  It he has extensive stage disease no be no role for concurrent radiation therapy unless he has metastatic disease that requires radiation therapy.  I discussed with him chemotherapy regimens using cis-platinum plus etoposide in limited stage and carboplatin plus etoposide in extensive stage.  I discussed with him chemotherapy is given once every 3weeks on day 1,2 and 3. On day 4 he comes in for Neulasta to decrease her risk of neutropenic infection.  Chemotherapy has side effects which include but not limited to alopecia, fatigue, mucositis, nausea vomiting, infusion reaction, neuropathy, cytopenia, bleeding, infection.  They expressed informed understanding and wished to proceed with the most aggressive therapy possible.  Given that he does not have severe comorbidities, I hope that he should be able to tolerate chemotherapy.  I discussed with him that I may consider dose reduction in the future if he tolerates chemotherapy poorly.   In the future after he has finished chemotherapy and tolerates well he may consider prophylactic intracranial radiation to decrease the risk of intracranial metastases in the future as determined by radiation oncologist.  2.  Headache/vertigo: I went ahead and order MR of the brain to rule out intracranial metastases.  If brain MRI is negative, then his symptoms can be possibly related to his small cell lung cancer which can be considered  paraneoplastic syndrome.  Further symptoms I gave him prescriptions for hydrocodone/APAP 1 tablet a 5/325 mg by mouth every 6 hours when necessary. I advised him to limit aspirin intake to less than 325mg  daily to decrease the risk of peptic ulcer disease.  3.  Afib:  He is on rate control with his PCP and cardiologist. He is off off Coumadin due to risk of bleeding because of his newly diagnosed lung cancer.  4.  History of smoking: he no longer smokes cigarettes.  5.  Code status:  To be discussed after staging PET scan and  MR of the brain.  6.  in preparation for chemotherapy he was given prescriptions for Compazine, Zofran, Ativan. He was arranged to attend chemotherapy class. I will see him in about one week to start therapy.  Total length of time of the face-to-face encounter was 60 minutes.  More than 50% of time was spent in counseling and coordination of care.

## 2011-02-01 NOTE — Telephone Encounter (Signed)
gv pt relative appt schedule for nov including mri for 11/9 @ 9 am @ [facility] and pet scan for 11/15 @ [facility] @ 6:45 am. Per [facility] ok to start tx b4 pet scan done. Also gv relative appt for 11/7 @ 10 am w/dr wentworth. Scan appts scheduled w/tiffany.

## 2011-02-02 ENCOUNTER — Ambulatory Visit
Admission: RE | Admit: 2011-02-02 | Discharge: 2011-02-02 | Disposition: A | Discharge status: Home / Self Care | Payer: Medicare Other | Source: Ambulatory Visit | Attending: Radiation Oncology | Admitting: Radiation Oncology

## 2011-02-02 ENCOUNTER — Other Ambulatory Visit: Payer: Self-pay

## 2011-02-02 ENCOUNTER — Encounter: Payer: Self-pay | Admitting: Radiation Oncology

## 2011-02-02 VITALS — BP 96/67 | HR 76 | Temp 97.7°F | Ht 69.0 in | Wt 155.2 lb

## 2011-02-02 DIAGNOSIS — E785 Hyperlipidemia, unspecified: Secondary | ICD-10-CM | POA: Insufficient documentation

## 2011-02-02 DIAGNOSIS — I4891 Unspecified atrial fibrillation: Secondary | ICD-10-CM | POA: Insufficient documentation

## 2011-02-02 DIAGNOSIS — C3490 Malignant neoplasm of unspecified part of unspecified bronchus or lung: Secondary | ICD-10-CM

## 2011-02-02 DIAGNOSIS — Z79899 Other long term (current) drug therapy: Secondary | ICD-10-CM | POA: Insufficient documentation

## 2011-02-02 DIAGNOSIS — I2510 Atherosclerotic heart disease of native coronary artery without angina pectoris: Secondary | ICD-10-CM | POA: Insufficient documentation

## 2011-02-02 DIAGNOSIS — Z7982 Long term (current) use of aspirin: Secondary | ICD-10-CM | POA: Insufficient documentation

## 2011-02-02 HISTORY — DX: Allergy, unspecified, initial encounter: T78.40XA

## 2011-02-02 HISTORY — DX: Gastro-esophageal reflux disease without esophagitis: K21.9

## 2011-02-02 MED ORDER — HYDROCODONE-ACETAMINOPHEN 5-325 MG PO TABS: 1.0000 | ORAL_TABLET | Freq: Four times a day (QID) | ORAL | Status: AC | PRN

## 2011-02-02 NOTE — Progress Notes (Signed)
CC:   Exie Parody, M.D. [name], M.D.  DIAGNOSIS:  Extensive stage small cell lung cancer.  PREVIOUS INTERVENTIONS:  Biopsy of right lung on 01/25/2011 revealing small cell carcinoma.  HISTORY OF PRESENT ILLNESS:  [name] is a pleasant 70 year old male who does not speak any English.  He is accompanied by his son who is actually employed by a translation service.  He presented to the hospital on October 28th with dizziness.  There was some concern that it could be related to his atrial fibrillation and upon evaluation in the emergency room, he was found to be in normal sinus rhythm.  A chest x- ray performed at that time, however, showed a right middle lung mass. This was new since a scan done last year.  A CT of the head on 01/22/2011 without contrast showed no evidence of metastatic disease.  A CT of the chest with contrast on 01/23/2011 showed a 2.6 x 2.5, right middle lobe mass, a 3.9 x 2.9 cm second right middle lobe mass and a 3.0 x 4.1 cm right middle lobe mass in the costophrenic angle.  Enlarged mediastinal, hilar and subcarinal adenopathy was noted.  The subcarinal was most notable measuring 8.2 cm.  After the results of the CT scan were obtained, a biopsy was performed of the right lung revealing high- grade carcinoma consistent with small-cell carcinoma.  Mr. Som was then referred by Dr. Gaylyn Rong for consideration of radiation in the management of his disease.  In speaking with Mr. Spickler son, he denies any hemoptysis.  He states that he has lost a small amount weight.  They talked to Dr. Gaylyn Rong and have a PET scan and MRI pending.  Dr. Gaylyn Rong discussed with them starting chemotherapy next week.  He denies any headaches or any focal numbness, weakness or visual changes.  He denies any pain.  PAST MEDICAL HISTORY: 1. Coronary artery disease, status post catheterization in 2008. 2. Paroxysmal atrial fibrillation. 3. Dyslipidemia.  SOCIAL HISTORY:  He quit smoking about 4  years ago.  He did smoke 2 packs a day for about 30 years.  He admits to social alcohol consumption and lives with his son and daughter-in-law.  ALLERGIES:  He has no known drug allergies.  CURRENT MEDICATIONS:  Include aspirin, vitamin D, Ativan, meclizine, Lopressor,  Zofran, Compazine, and simvastatin.  FAMILY HISTORY:  There is no family history of lung cancer.  REVIEW OF SYSTEMS:  A 12 point review of systems was performed. Pertinent positives are included as above.  All other systems reviewed and found to be negative.  PHYSICAL EXAMINATION:  He is a pleasant male in no distress, sitting comfortably on the examine room table.  Temperature 97.7, pulse 76, blood pressure 96/67, pulse ox 96%.  Weight 155 pounds.  He is an elderly male in no distress, sitting comfortably in a wheelchair.  Heart is regular rate and rhythm.  Lungs are clear to auscultation bilaterally.  He has 5/5 strength bilaterally.  Cranial nerves 2-12 are tested and intact.  IMPRESSION:  Mr. Licciardi is a 70 year old with newly diagnosed extensive stage small cell lung cancer.  RECOMMENDATIONS:  I talked to Mr. Winterton and his son about the natural history and prognosis in patients with extensive stage small cell lung cancer.  Despite the fact that he has what appears to be lung limited disease, I think that the multiple lesions in his right middle lobe will preclude safe treatment of his right lung.  I do not believe I  will be to spare enough with his normal lung parenchyma given the length of that field.  I discussed this with Dr. Gaylyn Rong.  I therefore would proceed on with palliative chemotherapy.  I discussed with his son that if his MRI scan does show metastatic disease, that I would recommend proceeding forward with radiation to the brain before chemotherapy.  We discussed the role of prophylactic cranial radiation in patients with extensive stage disease who have imaging partial or complete response.  We  discussed the process of simulation and making of a mask.  We discussed 10-12 treatments as an outpatient.  His son has translated for [place] in the past and is familiar with the process of radiation.  I give him an opportunity ask questions.  He said he would discuss it with his father at home.  I have not scheduled followup with them.  They have regular scheduled follow up Dr. Gaylyn Rong.    ______________________________ Lurline Hare, M.D. SW/MEDQ  D:  02/02/2011  T:  02/02/2011  Job:  [id number]

## 2011-02-02 NOTE — Progress Notes (Signed)
Please see the Nurse Progress Note in the MD Initial Consult Encounter for this patient. 

## 2011-02-03 ENCOUNTER — Telehealth: Payer: Self-pay | Admitting: *Deleted

## 2011-02-03 ENCOUNTER — Other Ambulatory Visit: Payer: Medicare Other

## 2011-02-03 ENCOUNTER — Encounter: Payer: Self-pay | Admitting: *Deleted

## 2011-02-03 ENCOUNTER — Other Ambulatory Visit: Payer: Self-pay | Admitting: Radiation Oncology

## 2011-02-03 NOTE — Telephone Encounter (Signed)
Called care management at ext [phone] and spoke with Lawana.  Requested to have vietnamese interpreter come on 11/14 for the patients first chemo.  Time to be here 9:30am and would need for one hour.  Sunday Spillers stated would take care of.    Explained to son at visit earlier today that would try to have interpreter here for the first chemo appointment.

## 2011-02-04 ENCOUNTER — Ambulatory Visit (HOSPITAL_COMMUNITY)
Admission: RE | Admit: 2011-02-04 | Discharge: 2011-02-04 | Disposition: A | Discharge status: Home / Self Care | Payer: Medicare Other | Source: Ambulatory Visit | Attending: Oncology | Admitting: Oncology

## 2011-02-04 DIAGNOSIS — R51 Headache: Secondary | ICD-10-CM | POA: Insufficient documentation

## 2011-02-04 DIAGNOSIS — R42 Dizziness and giddiness: Secondary | ICD-10-CM | POA: Insufficient documentation

## 2011-02-04 DIAGNOSIS — C3490 Malignant neoplasm of unspecified part of unspecified bronchus or lung: Secondary | ICD-10-CM | POA: Insufficient documentation

## 2011-02-04 MED ORDER — GADOBENATE DIMEGLUMINE 529 MG/ML IV SOLN
15.0000 mL | Freq: Once | INTRAVENOUS | Status: AC | PRN
  Administered 2011-02-04: 14 mL via INTRAVENOUS

## 2011-02-04 NOTE — Progress Notes (Signed)
Encounter addended by: Tessa Lerner, RN on: 02/04/2011  4:54 PM<BR>     Documentation filed: Charges VN

## 2011-02-07 NOTE — Progress Notes (Signed)
Encounter addended by: Tessa Lerner, RN on: 02/07/2011 10:17 AM<BR>     Documentation filed: Charges VN

## 2011-02-08 ENCOUNTER — Ambulatory Visit (HOSPITAL_BASED_OUTPATIENT_CLINIC_OR_DEPARTMENT_OTHER): Payer: Medicare Other | Admitting: Oncology

## 2011-02-08 ENCOUNTER — Encounter: Payer: Self-pay | Admitting: *Deleted

## 2011-02-08 VITALS — BP 116/83 | HR 78 | Temp 97.3°F | Ht 69.0 in | Wt 154.0 lb

## 2011-02-08 DIAGNOSIS — C3490 Malignant neoplasm of unspecified part of unspecified bronchus or lung: Secondary | ICD-10-CM

## 2011-02-08 DIAGNOSIS — I4891 Unspecified atrial fibrillation: Secondary | ICD-10-CM

## 2011-02-08 DIAGNOSIS — R51 Headache: Secondary | ICD-10-CM

## 2011-02-08 DIAGNOSIS — R42 Dizziness and giddiness: Secondary | ICD-10-CM

## 2011-02-08 DIAGNOSIS — C342 Malignant neoplasm of middle lobe, bronchus or lung: Secondary | ICD-10-CM

## 2011-02-08 NOTE — Progress Notes (Signed)
FMLA paperwork given to this nurse by pt's son, Tai Rashed Edler on pt's office visit this morning.  Forwarded paperwork to [employer].

## 2011-02-08 NOTE — Progress Notes (Signed)
[facility] OFFICE PROGRESS NOTE  [name], MD [address] [employer], [place]. [place] [address]  DIAGNOSIS: extensive stage small cell lung cancer; diagnosed in Oct 2012.  Final staging pending.  Extensive stage due to extensive involvement of lung field, not candidate for concurrent radiation.   No evidence of distant met so far.  PET scan pending.  CURRENT THERAPY:  Due to start Carboplatin/Etoposide 02/09/2011 on days 1,2, and 3 of q3wk cycle; with Neulasta on day 4 to decrease risk of neutropenic infection.   INTERVAL HISTORY: Gerald [name] 70 y.o. male returns for regular follow up with a daughter and a son. He reports that his vertigo and headache puffs secondly improved with Antivert and Vicodin. He'll he takes Vicodin about once a most twice a day. He is independent of activities of daily living including taking a shower and dressed himself.  He denies any visual changes, confusion, fold, seizure, does have dysarthria, dysphagia, bleeding symptoms, shortness of breath, hemoptysis, low back pain, bowel or bladder incontinence.  He needs a wheelchair with long ambulation but is OK by himself when walks around the house.   MEDICAL HISTORY: Past Medical History  Diagnosis Date  . A-fib   . Hyperlipidemia   . Small cell lung cancer 12/2010  . Smoking   . CAD (coronary artery disease)     last cath 2008 with noncritical CAD  . Allergy     seasonal allergies  . GERD (gastroesophageal reflux disease)     ALLERGIES:   has no known allergies.  MEDICATIONS: Current Outpatient Prescriptions  Medication Sig Dispense Refill  . aspirin 325 MG tablet Take 325 mg by mouth daily.        Marland Kitchen HYDROcodone-acetaminophen (NORCO) 5-325 MG per tablet Take 1 tablet by mouth every 6 (six) hours as needed for pain.  30 tablet  0  . LORazepam (ATIVAN) 0.5 MG tablet Take 1 tablet (0.5 mg total) by mouth every 6 (six) hours as needed for anxiety  (nausea/vomiting).  30 tablet  3  . meclizine (ANTIVERT) 12.5 MG tablet Take 1 tablet (12.5 mg total) by mouth 3 (three) times daily as needed for dizziness.  30 tablet  3  . metoprolol tartrate (LOPRESSOR) 25 MG tablet Take 12.5 mg by mouth 2 (two) times daily.        . ondansetron (ZOFRAN) 8 MG tablet Take 1 tablet (8 mg total) by mouth every 12 (twelve) hours as needed for nausea.  30 tablet  3  . prochlorperazine (COMPAZINE) 10 MG tablet Take 1 tablet (10 mg total) by mouth every 6 (six) hours as needed (nausea/vomting).  30 tablet  3  . simvastatin (ZOCOR) 40 MG tablet Take 40 mg by mouth at bedtime.          SURGICAL HISTORY:  Past Surgical History  Procedure Date  . Catherization 2008    cardiac catherization    REVIEW OF SYSTEMS:  Pertinent items are noted in HPI.   Filed Vitals:   02/08/11 0931  BP: 116/83  Pulse: 78  Temp: 97.3 F (36.3 C)   Wt Readings from Last 3 Encounters:  02/08/11 154 lb (69.854 kg)  02/02/11 155 lb 3.2 oz (70.398 kg)  02/01/11 150 lb 9.6 oz (68.312 kg)  ECOG 1  PHYSICAL EXAMINATION:  General: well-nourished in no acute distress. Eyes: no scleral icterus. ENT: There were no oropharyngeal lesions. Neck was without thyromegaly. Lymphatics: Negative cervical, supraclavicular or axillary adenopathy. Respiratory: lungs  were clear bilaterally without wheezing or crackles. Cardiovascular: Regular rate and rhythm, S1/S2, without murmur, rub or gallop. There was no pedal edema. GI: abdomen was soft, flat, nontender, nondistended, without organomegaly. Muscoloskeletal: no spinal tenderness of palpation of vertebral spine. Skin exam was without echymosis, petichae. Neuro exam was nonfocal with strength 5/5; normal tendon reflex. Patient was able to get on and off exam table without assistance. Gait was normal. Patient was alerted and oriented. Attention was good. Language was appropriate. Mood was normal without depression. Speech was not pressured. Thought  content was not tangential.   LABORATORY/RADIOLOGY DATA:  Lab Results  Component Value Date   WBC 7.3 01/26/2011   HGB 13.5 02/01/2011   HCT 39.6 02/01/2011   PLT 174 02/01/2011   GLUCOSE 99 02/01/2011   CHOL  Value: 121        ATP III CLASSIFICATION:  <200     mg/dL   Desirable  914-782  mg/dL   Borderline High  >=956    mg/dL   High        05/11/863   TRIG 29 09/11/2009   HDL 41 09/11/2009   LDLCALC  Value: 74        Total Cholesterol/HDL:CHD Risk Coronary Heart Disease Risk Table                     Men   Women  1/2 Average Risk   3.4   3.3  Average Risk       5.0   4.4  2 X Average Risk   9.6   7.1  3 X Average Risk  23.4   11.0        Use the calculated Patient Ratio above and the CHD Risk Table to determine the patient's CHD Risk.        ATP III CLASSIFICATION (LDL):  <100     mg/dL   Optimal  784-696  mg/dL   Near or Above                    Optimal  130-159  mg/dL   Borderline  295-284  mg/dL   High  >132     mg/dL   Very High 4/40/1027   ALT 14 02/01/2011   AST 24 02/01/2011   NA 139 02/01/2011   K 4.1 02/01/2011   CL 103 02/01/2011   CREATININE 1.09 02/01/2011   BUN 16 02/01/2011   CO2 26 02/01/2011   TSH 2.201 01/23/2011   INR 0.99 01/26/2011   HGBA1C 5.8* 01/23/2011    Mr Brain W Wo Contrast:  I personally reviewed these images myself and showed the result to the patient and his children.   02/04/2011  *RADIOLOGY REPORT*  Clinical Data: Small cell lung cancer.  Headache and vertigo.  MRI HEAD WITHOUT AND WITH CONTRAST  Technique:  Multiplanar, multiecho pulse sequences of the brain and surrounding structures were obtained according to standard protocol without and with intravenous contrast  Contrast: 14mL MULTIHANCE GADOBENATE DIMEGLUMINE 529 MG/ML IV SOLN  Comparison: CT head without contrast 01/25/2011.  Findings: No pathologic enhancement is present to suggest metastatic disease of the brain.  The study is mildly degraded by patient motion, decreasing sensitivity for very small lesions.   No acute infarct, hemorrhage, or mass lesion is present.  Mild generalized atrophy is present with proportionate ex vacuo dilation of the lateral ventricles.  Scattered periventricular subcortical T2 and FLAIR hyperintensities are evident bilaterally.  Flow is present in  the major intracranial arteries.  The globes and orbits are intact.  The paranasal sinuses and mastoid air cells are clear.  IMPRESSION:  1.  No evidence of metastatic disease of the brain or meninges. 2.  Mild atrophy and a advanced white matter disease.  The finding is nonspecific but can be seen in the setting of chronic microvascular ischemia, a demyelinating process such as multiple sclerosis, vasculitis, complicated migraine headaches, or as the sequelae of a prior infectious or inflammatory process.  Original Report Authenticated By: Jamesetta Orleans. MATTERN, M.D.   ASSESSMENT AND PLAN:   1. Small cell lung cancer:  Staging: I discussed with patient and his children that he has extensive stage per evaluationby  radiation oncology Dr. Lurline Hare.  The involvement of his lungs are extensive to extensive for concurrent chemoradiation.  Treatment:   I therefore advised him that the treatment would be chemotherapy alone but soft. Given the extensive stage, I do not advocade cisplatin due to high risk of side effects. I therefore recommended chemotherapy with carboplatin and etoposide once every 3  weeks for 6 cycles total.  I discussed with them the side effects of chemotherapy included a limited to alopecia, fatigue, mucositis, infusion reaction, nausea vomiting, neuropathy, cytopenia, present infection, bleeding.  He expressed informed understanding was to be skin therapy site to mark. Once he is on on chemotherapy for 3 cycles we will get restaging CT scan after 3 and 6 cycles to assess response to chemo.  We will readdress the issues of the prophylactic intracranial radiation when he is finished with chemotherapy..  2.  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 His symptoms are controlled with hydrocodone/APAP 1 tablet a 5/325 mg by mouth every 6 hours when necessary and Antivert.    3.  Afib:  He is on rate control with his PCP and cardiologist. He is off off Coumadin due to risk of bleeding because of his newly diagnosed lung cancer.  I advised him to decrease the dose of aspirin down to 81 mg from 325 mg to decrease her risk off from thrombocytopenia from chemotherapy causing bleeding.  4. History of smoking: he no longer smokes cigarettes.   5. Code status: To be discussed after staging PET scan and MR of the brain.   6. Follow up:  Chemotherapy this week November 14, November 15, November 16 and Neulasta on November 17. I will see him myself again on 03/01/1999 before the  second cycle of chemotherapy.

## 2011-02-09 ENCOUNTER — Ambulatory Visit (HOSPITAL_BASED_OUTPATIENT_CLINIC_OR_DEPARTMENT_OTHER): Payer: Medicare Other

## 2011-02-09 ENCOUNTER — Other Ambulatory Visit: Payer: Self-pay | Admitting: Oncology

## 2011-02-09 VITALS — BP 131/87 | HR 80 | Temp 97.4°F

## 2011-02-09 DIAGNOSIS — C3490 Malignant neoplasm of unspecified part of unspecified bronchus or lung: Secondary | ICD-10-CM

## 2011-02-09 DIAGNOSIS — Z5111 Encounter for antineoplastic chemotherapy: Secondary | ICD-10-CM

## 2011-02-09 MED ORDER — ONDANSETRON 16 MG/50ML IVPB (CHCC)
16.0000 mg | Freq: Once | INTRAVENOUS | Status: AC
  Administered 2011-02-09: 16 mg via INTRAVENOUS

## 2011-02-09 MED ORDER — DEXAMETHASONE SODIUM PHOSPHATE 4 MG/ML IJ SOLN
20.0000 mg | Freq: Once | INTRAMUSCULAR | Status: AC
  Administered 2011-02-09: 20 mg via INTRAVENOUS

## 2011-02-09 MED ORDER — SODIUM CHLORIDE 0.9 % IV SOLN
100.0000 mg/m2 | Freq: Once | INTRAVENOUS | Status: AC
  Administered 2011-02-09: 190 mg via INTRAVENOUS
  Filled 2011-02-09: qty 9.5

## 2011-02-09 MED ORDER — SODIUM CHLORIDE 0.9 % IV SOLN
434.5000 mg | Freq: Once | INTRAVENOUS | Status: AC
  Administered 2011-02-09: 430 mg via INTRAVENOUS
  Filled 2011-02-09: qty 43

## 2011-02-09 MED ORDER — SODIUM CHLORIDE 0.9 % IV SOLN: Freq: Once | INTRAVENOUS | Status: DC

## 2011-02-09 NOTE — Patient Instructions (Signed)
Pt. via translator verbalized understanding to call if any problems.

## 2011-02-10 ENCOUNTER — Ambulatory Visit (HOSPITAL_COMMUNITY)
Admission: RE | Admit: 2011-02-10 | Discharge: 2011-02-10 | Disposition: A | Discharge status: Home / Self Care | Payer: Medicare Other | Source: Ambulatory Visit | Attending: Oncology | Admitting: Oncology

## 2011-02-10 ENCOUNTER — Telehealth: Payer: Self-pay | Admitting: *Deleted

## 2011-02-10 ENCOUNTER — Ambulatory Visit (HOSPITAL_BASED_OUTPATIENT_CLINIC_OR_DEPARTMENT_OTHER): Payer: Medicare Other

## 2011-02-10 ENCOUNTER — Encounter: Payer: Self-pay | Admitting: *Deleted

## 2011-02-10 ENCOUNTER — Encounter (HOSPITAL_COMMUNITY): Payer: Self-pay

## 2011-02-10 VITALS — BP 111/75 | HR 78 | Temp 97.5°F

## 2011-02-10 DIAGNOSIS — C3490 Malignant neoplasm of unspecified part of unspecified bronchus or lung: Secondary | ICD-10-CM

## 2011-02-10 DIAGNOSIS — I700 Atherosclerosis of aorta: Secondary | ICD-10-CM | POA: Insufficient documentation

## 2011-02-10 DIAGNOSIS — I2510 Atherosclerotic heart disease of native coronary artery without angina pectoris: Secondary | ICD-10-CM | POA: Insufficient documentation

## 2011-02-10 DIAGNOSIS — C342 Malignant neoplasm of middle lobe, bronchus or lung: Secondary | ICD-10-CM | POA: Insufficient documentation

## 2011-02-10 DIAGNOSIS — R599 Enlarged lymph nodes, unspecified: Secondary | ICD-10-CM | POA: Insufficient documentation

## 2011-02-10 DIAGNOSIS — Z5111 Encounter for antineoplastic chemotherapy: Secondary | ICD-10-CM

## 2011-02-10 LAB — GLUCOSE, CAPILLARY: Glucose-Capillary: 126 mg/dL — ABNORMAL HIGH (ref 70–99)

## 2011-02-10 MED ORDER — FLUDEOXYGLUCOSE F - 18 (FDG) INJECTION
17.4000 | Freq: Once | INTRAVENOUS | Status: AC | PRN
  Administered 2011-02-10: 17.4 via INTRAVENOUS

## 2011-02-10 MED ORDER — PROCHLORPERAZINE MALEATE 10 MG PO TABS
10.0000 mg | ORAL_TABLET | Freq: Once | ORAL | Status: AC
  Administered 2011-02-10: 10 mg via ORAL

## 2011-02-10 MED ORDER — SODIUM CHLORIDE 0.9 % IV SOLN
Freq: Once | INTRAVENOUS | Status: AC
  Administered 2011-02-10: 12:00:00 via INTRAVENOUS

## 2011-02-10 MED ORDER — SODIUM CHLORIDE 0.9 % IV SOLN
100.0000 mg/m2 | Freq: Once | INTRAVENOUS | Status: AC
  Administered 2011-02-10: 190 mg via INTRAVENOUS
  Filled 2011-02-10: qty 9.5

## 2011-02-10 NOTE — Telephone Encounter (Signed)
NO NOTE

## 2011-02-10 NOTE — Progress Notes (Signed)
FMLA paperwork completed and signed for pt's son, Brailen Macneal.  Faxed forms to [facility] at son's HR [phone].  Copy of forms to Axel Filler in managed care and original forms given back to Tai in infusion room today w/ his father.

## 2011-02-11 ENCOUNTER — Ambulatory Visit (HOSPITAL_BASED_OUTPATIENT_CLINIC_OR_DEPARTMENT_OTHER): Payer: Medicare Other

## 2011-02-11 VITALS — BP 106/73 | HR 72 | Temp 96.6°F

## 2011-02-11 DIAGNOSIS — Z5111 Encounter for antineoplastic chemotherapy: Secondary | ICD-10-CM

## 2011-02-11 DIAGNOSIS — C342 Malignant neoplasm of middle lobe, bronchus or lung: Secondary | ICD-10-CM

## 2011-02-11 DIAGNOSIS — C3490 Malignant neoplasm of unspecified part of unspecified bronchus or lung: Secondary | ICD-10-CM

## 2011-02-11 MED ORDER — SODIUM CHLORIDE 0.9 % IV SOLN
100.0000 mg/m2 | Freq: Once | INTRAVENOUS | Status: AC
  Administered 2011-02-11: 190 mg via INTRAVENOUS
  Filled 2011-02-11: qty 9.5

## 2011-02-11 MED ORDER — PROCHLORPERAZINE MALEATE 10 MG PO TABS
10.0000 mg | ORAL_TABLET | Freq: Once | ORAL | Status: AC
Start: 2011-02-11 — End: 2011-02-11
  Administered 2011-02-11: 10 mg via ORAL

## 2011-02-11 MED ORDER — SODIUM CHLORIDE 0.9 % IV SOLN
Freq: Once | INTRAVENOUS | Status: AC
  Administered 2011-02-11: 13:00:00 via INTRAVENOUS

## 2011-02-11 NOTE — Patient Instructions (Signed)
[facility] Discharge Instructions for Patients Receiving Chemotherapy  Today you received the following chemotherapy agent--Etoposide  To help prevent nausea and vomiting after your treatment, we encourage you to take your nausea medication as directed by MD    If you develop nausea and vomiting that is not controlled by your nausea medication, call the clinic. If it is after clinic hours your family physician or the after hours number for the clinic or go to the Emergency Department.  May take Tylenol or Advil for shoulder pain prn and try OTC antacid for heartburn. Call MD if not effective or if rash develops on back or shoulder.  Push po fluids. Try OTC MiraLax daily if needed for constipation  Return to clinic tomorrow for Neulasta injection   BELOW ARE SYMPTOMS THAT SHOULD BE REPORTED IMMEDIATELY:  *FEVER GREATER THAN 100.5 F  *CHILLS WITH OR WITHOUT FEVER  NAUSEA AND VOMITING THAT IS NOT CONTROLLED WITH YOUR NAUSEA MEDICATION  *UNUSUAL SHORTNESS OF BREATH  *UNUSUAL BRUISING OR BLEEDING  TENDERNESS IN MOUTH AND THROAT WITH OR WITHOUT PRESENCE OF ULCERS  *URINARY PROBLEMS  *BOWEL PROBLEMS  UNUSUAL RASH Items with * indicate a potential emergency and should be followed up as soon as possible.  . Feel free to call the clinic you have any questions or concerns. The clinic phone number is [phone].   I have been informed and understand all the instructions given to me. I know to contact the clinic, my physician, or go to the Emergency Department if any problems should occur. I do not have any questions at this time, but understand that I may call the clinic during office hours   should I have any questions or need assistance in obtaining follow up care.    __________________________________________  _____________  __________ Signature of Patient or Authorized Representative            Date                    Time    __________________________________________ Nurse's Signature

## 2011-02-12 ENCOUNTER — Ambulatory Visit (HOSPITAL_BASED_OUTPATIENT_CLINIC_OR_DEPARTMENT_OTHER): Payer: Medicare Other

## 2011-02-12 VITALS — BP 108/69 | HR 76 | Temp 97.2°F

## 2011-02-12 DIAGNOSIS — C3490 Malignant neoplasm of unspecified part of unspecified bronchus or lung: Secondary | ICD-10-CM

## 2011-02-12 DIAGNOSIS — C342 Malignant neoplasm of middle lobe, bronchus or lung: Secondary | ICD-10-CM

## 2011-02-12 MED ORDER — PEGFILGRASTIM INJECTION 6 MG/0.6ML
6.0000 mg | Freq: Once | SUBCUTANEOUS | Status: AC
  Administered 2011-02-12: 6 mg via SUBCUTANEOUS

## 2011-02-13 ENCOUNTER — Ambulatory Visit: Payer: Medicare Other

## 2011-02-14 ENCOUNTER — Telehealth: Payer: Self-pay | Admitting: *Deleted

## 2011-02-14 NOTE — Telephone Encounter (Signed)
Message left requesting a return call with an update in patient's status.  Encouraged to drink fluids daily with this message.

## 2011-02-14 NOTE — Telephone Encounter (Signed)
Message copied by Augusto Garbe on Mon Feb 14, 2011  2:14 PM ------      Message from: [place], [place] P      Created: Wed Feb 09, 2011  1:26 PM      Regarding: Chemo Follow-up call      Contact: [phone]       1st time VP-16/Carbo.  Dr. Gaylyn Rong

## 2011-02-24 NOTE — Progress Notes (Signed)
Encounter addended by: Delynn Flavin, RN on: 02/24/2011 10:35 AM<BR>     Documentation filed: Charges VN

## 2011-02-28 ENCOUNTER — Other Ambulatory Visit: Payer: Self-pay | Admitting: *Deleted

## 2011-02-28 ENCOUNTER — Telehealth: Payer: Self-pay | Admitting: *Deleted

## 2011-02-28 NOTE — Telephone Encounter (Signed)
Daughter, Enid Derry, called to report pt has cold symptoms since yesterday.  He has chills, headache and a dry cough.  She denies pt has any fevers or productive sputum. She asks if pt can take OTC cold medicine such at Theraflu and Robitussin.  Instructed ok to take OTC cold meds as directed on label.  Instructed to call back if pt develops any fevers or productive sputum.  She verbalized understanding.  Pt has appt to see Dr. Gaylyn Rong in office tomorrow.

## 2011-03-01 ENCOUNTER — Ambulatory Visit (HOSPITAL_COMMUNITY)
Admission: RE | Admit: 2011-03-01 | Discharge: 2011-03-01 | Disposition: A | Discharge status: Home / Self Care | Payer: Medicare Other | Source: Ambulatory Visit | Attending: Oncology | Admitting: Oncology

## 2011-03-01 ENCOUNTER — Other Ambulatory Visit: Payer: Self-pay | Admitting: Oncology

## 2011-03-01 ENCOUNTER — Other Ambulatory Visit: Payer: Medicare Other | Admitting: Lab

## 2011-03-01 ENCOUNTER — Telehealth: Payer: Self-pay | Admitting: Oncology

## 2011-03-01 ENCOUNTER — Ambulatory Visit (HOSPITAL_BASED_OUTPATIENT_CLINIC_OR_DEPARTMENT_OTHER): Payer: Medicare Other | Admitting: Oncology

## 2011-03-01 ENCOUNTER — Other Ambulatory Visit (HOSPITAL_BASED_OUTPATIENT_CLINIC_OR_DEPARTMENT_OTHER): Payer: Medicare Other | Admitting: Lab

## 2011-03-01 ENCOUNTER — Ambulatory Visit: Payer: Medicare Other

## 2011-03-01 ENCOUNTER — Other Ambulatory Visit: Payer: Self-pay | Admitting: Certified Registered Nurse Anesthetist

## 2011-03-01 VITALS — BP 108/69 | HR 77 | Temp 101.1°F | Ht 69.0 in | Wt 153.6 lb

## 2011-03-01 DIAGNOSIS — R059 Cough, unspecified: Secondary | ICD-10-CM

## 2011-03-01 DIAGNOSIS — R05 Cough: Secondary | ICD-10-CM

## 2011-03-01 DIAGNOSIS — C3490 Malignant neoplasm of unspecified part of unspecified bronchus or lung: Secondary | ICD-10-CM

## 2011-03-01 DIAGNOSIS — R5381 Other malaise: Secondary | ICD-10-CM

## 2011-03-01 DIAGNOSIS — C342 Malignant neoplasm of middle lobe, bronchus or lung: Secondary | ICD-10-CM

## 2011-03-01 DIAGNOSIS — R5383 Other fatigue: Secondary | ICD-10-CM

## 2011-03-01 DIAGNOSIS — J3489 Other specified disorders of nose and nasal sinuses: Secondary | ICD-10-CM

## 2011-03-01 DIAGNOSIS — J189 Pneumonia, unspecified organism: Secondary | ICD-10-CM

## 2011-03-01 DIAGNOSIS — R599 Enlarged lymph nodes, unspecified: Secondary | ICD-10-CM | POA: Insufficient documentation

## 2011-03-01 LAB — CBC WITH DIFFERENTIAL/PLATELET
BASO%: 0.8 % (ref 0.0–2.0)
Basophils Absolute: 0.1 10*3/uL (ref 0.0–0.1)
EOS%: 0.4 % (ref 0.0–7.0)
Eosinophils Absolute: 0 10*3/uL (ref 0.0–0.5)
HCT: 33.7 % — ABNORMAL LOW (ref 38.4–49.9)
HGB: 11.7 g/dL — ABNORMAL LOW (ref 13.0–17.1)
LYMPH%: 11.5 % — ABNORMAL LOW (ref 14.0–49.0)
MCH: 31.5 pg (ref 27.2–33.4)
MCHC: 34.7 g/dL (ref 32.0–36.0)
MCV: 90.6 fL (ref 79.3–98.0)
MONO#: 1.8 10*3/uL — ABNORMAL HIGH (ref 0.1–0.9)
MONO%: 23.7 % — ABNORMAL HIGH (ref 0.0–14.0)
NEUT#: 4.7 10*3/uL (ref 1.5–6.5)
NEUT%: 63.6 % (ref 39.0–75.0)
Platelets: 277 10*3/uL (ref 140–400)
RBC: 3.72 10*6/uL — ABNORMAL LOW (ref 4.20–5.82)
RDW: 14.3 % (ref 11.0–14.6)
WBC: 7.4 10*3/uL (ref 4.0–10.3)
lymph#: 0.9 10*3/uL (ref 0.9–3.3)
nRBC: 0 % (ref 0–0)

## 2011-03-01 LAB — COMPREHENSIVE METABOLIC PANEL
ALT: 23 U/L (ref 0–53)
AST: 22 U/L (ref 0–37)
Albumin: 3.9 g/dL (ref 3.5–5.2)
Alkaline Phosphatase: 66 U/L (ref 39–117)
BUN: 12 mg/dL (ref 6–23)
CO2: 24 mEq/L (ref 19–32)
Calcium: 8.2 mg/dL — ABNORMAL LOW (ref 8.4–10.5)
Chloride: 99 mEq/L (ref 96–112)
Creatinine, Ser: 1.13 mg/dL (ref 0.50–1.35)
Glucose, Bld: 111 mg/dL — ABNORMAL HIGH (ref 70–99)
Potassium: 3.9 mEq/L (ref 3.5–5.3)
Sodium: 136 mEq/L (ref 135–145)
Total Bilirubin: 0.2 mg/dL — ABNORMAL LOW (ref 0.3–1.2)
Total Protein: 7.1 g/dL (ref 6.0–8.3)

## 2011-03-01 MED ORDER — LEVOFLOXACIN 500 MG PO TABS: 500.0000 mg | ORAL_TABLET | Freq: Every day | ORAL | Status: AC

## 2011-03-01 MED ORDER — BENZONATATE 100 MG PO CAPS: 100.0000 mg | ORAL_CAPSULE | Freq: Three times a day (TID) | ORAL | Status: AC | PRN

## 2011-03-01 NOTE — Progress Notes (Signed)
[facility] OFFICE PROGRESS NOTE  [name], MD [address] [employer], [place]. [place] [address]  DIAGNOSIS: extensive stage small cell lung cancer; diagnosed in Oct 2012.    Extensive stage due to extensive involvement of lung field, not candidate for concurrent radiation.   No evidence of distant met.   CURRENT THERAPY:  started Carboplatin/Etoposide 02/09/2011 on days 1,2, and 3 of q3wk cycle; with Neulasta on day 4 to decrease risk of neutropenic infection.   INTERVAL HISTORY: Gerald [name] 70 y.o. male returns for regular follow up with a son.  His oldest daughter had a cold last week.  For the past 3 days, he has been having low grade fever up to 58F.  He has nasal congestion and productive cough of clear sputum.  He is taking Robitussin without significant improvement of his cough.  He denies SOB, hemoptysis, CP with cough.  He presented to Dr. Tresa Endo last week for afib with RVR and was recommended to go to ER; however, he did not want to.  He was thus placed on Xarelto to prevent embolic complication.  He has fatigue more the last few days than before chemo due to the fever/cough.  He is still independent of activities of daily living such as showering/bathroom; however, he is very sedentary otherwise.  He had some nausea with chemo 3 wks ago but no frank vomiting.  He denies mucositis, CP, abd pain, bleeding sx; neuropathy, skin rash, back pain.   MEDICAL HISTORY: Past Medical History  Diagnosis Date  . A-fib   . Hyperlipidemia   . Small cell lung cancer 12/2010  . Smoking   . CAD (coronary artery disease)     last cath 2008 with noncritical CAD  . Allergy     seasonal allergies  . GERD (gastroesophageal reflux disease)     ALLERGIES:   has no known allergies.  MEDICATIONS: Current Outpatient Prescriptions  Medication Sig Dispense Refill  . aspirin 325 MG tablet Take 325 mg by mouth daily.        . digoxin (LANOXIN) 0.25 MG  tablet Take 250 mcg by mouth daily.        Marland Kitchen guaiFENesin-codeine (ROBITUSSIN AC) 100-10 MG/5ML syrup Take 5 mLs by mouth 3 (three) times daily as needed.        . meclizine (ANTIVERT) 12.5 MG tablet Take 1 tablet (12.5 mg total) by mouth 3 (three) times daily as needed for dizziness.  30 tablet  3  . metoprolol tartrate (LOPRESSOR) 25 MG tablet Take 12.5 mg by mouth 2 (two) times daily.        Marland Kitchen Phenylephrine-Pheniramine-DM (THERAFLU COLD & COUGH PO) Take by mouth as needed.        . Rivaroxaban (XARELTO) 20 MG TABS Take 20 mg by mouth daily.       . simvastatin (ZOCOR) 40 MG tablet Take 40 mg by mouth at bedtime.        . benzonatate (TESSALON PERLES) 100 MG capsule Take 1 capsule (100 mg total) by mouth 3 (three) times daily as needed for cough.  30 capsule  0  . levofloxacin (LEVAQUIN) 500 MG tablet Take 1 tablet (500 mg total) by mouth daily.  7 tablet  0    SURGICAL HISTORY:  Past Surgical History  Procedure Date  . Catherization 2008    cardiac catherization    REVIEW OF SYSTEMS:  Pertinent items are noted in HPI.   Filed Vitals:   03/01/11  0932  BP: 108/69  Pulse: 77  Temp: 101.1 F (38.4 C)   Wt Readings from Last 3 Encounters:  03/01/11 153 lb 9.6 oz (69.673 kg)  02/08/11 154 lb (69.854 kg)  02/02/11 155 lb 3.2 oz (70.398 kg)  ECOG 2  PHYSICAL EXAMINATION:  General: thin-appearing man in no acute distress but was coughing. Eyes: no scleral icterus. ENT: There were no oropharyngeal lesions. Neck was without thyromegaly. Lymphatics: Negative cervical, supraclavicular or axillary adenopathy. Respiratory: lungs were clear bilaterally without wheezing or crackles. Cardiovascular: Regular rate and rhythm, S1/S2, without murmur, rub or gallop. There was no pedal edema. GI: abdomen was soft, flat, nontender, nondistended, without organomegaly. Muscoloskeletal: no spinal tenderness of palpation of vertebral spine. Skin exam was without echymosis, petichae. Neuro exam was nonfocal  with strength 5/5; normal tendon reflex. Patient was able to get on and off exam table without assistance. Gait was normal. Patient was alerted and oriented. Attention was good. Language was appropriate. Mood was normal without depression. Speech was not pressured. Thought content was not tangential.   LABORATORY/RADIOLOGY DATA:  Lab Results  Component Value Date   WBC 7.4 03/01/2011   HGB 11.7* 03/01/2011   HCT 33.7* 03/01/2011   PLT 277 03/01/2011   GLUCOSE 99 02/01/2011   CHOL  Value: 121        ATP III CLASSIFICATION:  <200     mg/dL   Desirable  086-578  mg/dL   Borderline High  >=469    mg/dL   High        09/24/5282   TRIG 29 09/11/2009   HDL 41 09/11/2009   LDLCALC  Value: 74        Total Cholesterol/HDL:CHD Risk Coronary Heart Disease Risk Table                     Men   Women  1/2 Average Risk   3.4   3.3  Average Risk       5.0   4.4  2 X Average Risk   9.6   7.1  3 X Average Risk  23.4   11.0        Use the calculated Patient Ratio above and the CHD Risk Table to determine the patient's CHD Risk.        ATP III CLASSIFICATION (LDL):  <100     mg/dL   Optimal  132-440  mg/dL   Near or Above                    Optimal  130-159  mg/dL   Borderline  102-725  mg/dL   High  >366     mg/dL   Very High 4/40/3474   ALT 14 02/01/2011   AST 24 02/01/2011   NA 139 02/01/2011   K 4.1 02/01/2011   CL 103 02/01/2011   CREATININE 1.09 02/01/2011   BUN 16 02/01/2011   CO2 26 02/01/2011   TSH 2.201 01/23/2011   INR 0.99 01/26/2011   HGBA1C 5.8* 01/23/2011    ASSESSMENT AND PLAN:   1. Small cell lung cancer:  He is status post 1 cycle of chemotherapy consisting of carboplatin and etoposide 2 weeks ago. He has been having worsening fatigue compared to before chemotherapy. He has cough and fever today with borderline performance status and therefore I recommended delaying f chemotherapy for one week.  When chemotherapy is resumed next week,  I recommended to decrease the dose of  chemotherapy carboplatin  and  etoposide by 25% given the fact that he has borderline ECoG performance status.   2. 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 His symptoms are controlled with hydrocodone/APAP 1 tablet a 5/325 mg by mouth every 6 hours when necessary and Antivert.    3.  Afib:  He is on rate control with his PCP and cardiologist. He was on Coumadin which was discontinued.  He was started on Xarelto last week when he had Afib with RVR.  I personally discussed with Dr. [name] today about his Xarelto.  With chemotherapy he is expected to have thrombocytopenia in the future.  Dr. Nicholaus Bloom and I agreed on a compromise that if his platelet is less than 80,000, we may need to hold his Xarelto.  4. History of smoking: he no longer smokes cigarettes.   5.  Cough, fever, nasal congestion:  I ordered a chest x-ray today and I reviewed myself personally. There was no evidence of edema, infusion, infiltrate. Most likely serving an upper respirator infection. However given his high grade fever, and relatively immune compromised status because chemotherapy, I wanted to decrease his risk off superinfection and therefore I prescribed a 7 day course of Levaquin.  I also gave him Tessalon Perles when necessary cough.  6. Code status: full code for now.  May need to readdress in the future if his performance status worsens.   7. Follow up:  Follow with me on March 07, 2011 to evaluate before the second cycle of delayed chemotherapy.

## 2011-03-01 NOTE — Telephone Encounter (Signed)
gve the pt's son the dec 2012 appt calendar along with the cxr referral for today

## 2011-03-02 ENCOUNTER — Ambulatory Visit: Payer: Medicare Other

## 2011-03-03 ENCOUNTER — Ambulatory Visit: Payer: Medicare Other

## 2011-03-04 ENCOUNTER — Ambulatory Visit: Payer: Medicare Other

## 2011-03-07 ENCOUNTER — Telehealth: Payer: Self-pay | Admitting: Oncology

## 2011-03-07 ENCOUNTER — Ambulatory Visit (HOSPITAL_BASED_OUTPATIENT_CLINIC_OR_DEPARTMENT_OTHER): Payer: Medicare Other | Admitting: Oncology

## 2011-03-07 ENCOUNTER — Ambulatory Visit (HOSPITAL_BASED_OUTPATIENT_CLINIC_OR_DEPARTMENT_OTHER): Payer: Medicare Other

## 2011-03-07 ENCOUNTER — Other Ambulatory Visit (HOSPITAL_BASED_OUTPATIENT_CLINIC_OR_DEPARTMENT_OTHER): Payer: Medicare Other | Admitting: Lab

## 2011-03-07 VITALS — BP 107/69 | HR 53 | Temp 97.3°F | Ht 69.0 in | Wt 150.3 lb

## 2011-03-07 VITALS — Ht 69.0 in | Wt 150.0 lb

## 2011-03-07 DIAGNOSIS — I4891 Unspecified atrial fibrillation: Secondary | ICD-10-CM

## 2011-03-07 DIAGNOSIS — Z5111 Encounter for antineoplastic chemotherapy: Secondary | ICD-10-CM

## 2011-03-07 DIAGNOSIS — J189 Pneumonia, unspecified organism: Secondary | ICD-10-CM

## 2011-03-07 DIAGNOSIS — C342 Malignant neoplasm of middle lobe, bronchus or lung: Secondary | ICD-10-CM

## 2011-03-07 DIAGNOSIS — C3490 Malignant neoplasm of unspecified part of unspecified bronchus or lung: Secondary | ICD-10-CM

## 2011-03-07 LAB — COMPREHENSIVE METABOLIC PANEL
ALT: 31 U/L (ref 0–53)
AST: 26 U/L (ref 0–37)
Albumin: 3.6 g/dL (ref 3.5–5.2)
Alkaline Phosphatase: 57 U/L (ref 39–117)
BUN: 14 mg/dL (ref 6–23)
CO2: 24 mEq/L (ref 19–32)
Calcium: 8.6 mg/dL (ref 8.4–10.5)
Chloride: 105 mEq/L (ref 96–112)
Creatinine, Ser: 0.82 mg/dL (ref 0.50–1.35)
Glucose, Bld: 102 mg/dL — ABNORMAL HIGH (ref 70–99)
Potassium: 4.2 mEq/L (ref 3.5–5.3)
Sodium: 137 mEq/L (ref 135–145)
Total Bilirubin: 0.3 mg/dL (ref 0.3–1.2)
Total Protein: 6.4 g/dL (ref 6.0–8.3)

## 2011-03-07 LAB — CBC WITH DIFFERENTIAL/PLATELET
BASO%: 1.5 % (ref 0.0–2.0)
Basophils Absolute: 0.1 10*3/uL (ref 0.0–0.1)
EOS%: 0.7 % (ref 0.0–7.0)
Eosinophils Absolute: 0 10*3/uL (ref 0.0–0.5)
HCT: 32.4 % — ABNORMAL LOW (ref 38.4–49.9)
HGB: 11.4 g/dL — ABNORMAL LOW (ref 13.0–17.1)
LYMPH%: 25.2 % (ref 14.0–49.0)
MCH: 31.4 pg (ref 27.2–33.4)
MCHC: 35.2 g/dL (ref 32.0–36.0)
MCV: 89.3 fL (ref 79.3–98.0)
MONO#: 1.2 10*3/uL — ABNORMAL HIGH (ref 0.1–0.9)
MONO%: 22.8 % — ABNORMAL HIGH (ref 0.0–14.0)
NEUT#: 2.7 10*3/uL (ref 1.5–6.5)
NEUT%: 49.8 % (ref 39.0–75.0)
Platelets: 390 10*3/uL (ref 140–400)
RBC: 3.63 10*6/uL — ABNORMAL LOW (ref 4.20–5.82)
RDW: 14.3 % (ref 11.0–14.6)
WBC: 5.4 10*3/uL (ref 4.0–10.3)
lymph#: 1.4 10*3/uL (ref 0.9–3.3)
nRBC: 0 % (ref 0–0)

## 2011-03-07 MED ORDER — ONDANSETRON 16 MG/50ML IVPB (CHCC)
16.0000 mg | Freq: Once | INTRAVENOUS | Status: AC
  Administered 2011-03-07: 16 mg via INTRAVENOUS

## 2011-03-07 MED ORDER — SODIUM CHLORIDE 0.9 % IV SOLN
75.0000 mg/m2 | Freq: Once | INTRAVENOUS | Status: AC
  Administered 2011-03-07: 140 mg via INTRAVENOUS
  Filled 2011-03-07: qty 7

## 2011-03-07 MED ORDER — SODIUM CHLORIDE 0.9 % IV SOLN
325.8750 mg | Freq: Once | INTRAVENOUS | Status: AC
  Administered 2011-03-07: 330 mg via INTRAVENOUS
  Filled 2011-03-07: qty 33

## 2011-03-07 MED ORDER — DEXAMETHASONE SODIUM PHOSPHATE 4 MG/ML IJ SOLN
20.0000 mg | Freq: Once | INTRAMUSCULAR | Status: AC
  Administered 2011-03-07: 20 mg via INTRAVENOUS

## 2011-03-07 MED ORDER — SODIUM CHLORIDE 0.9 % IV SOLN: Freq: Once | INTRAVENOUS | Status: DC

## 2011-03-07 NOTE — Progress Notes (Signed)
[facility] OFFICE PROGRESS NOTE  [name], MD [address] [employer], [place]. [place] [address]  DIAGNOSIS: extensive stage small cell lung cancer; diagnosed in Oct 2012.    Extensive stage due to extensive involvement of lung field, not candidate for concurrent radiation.   No evidence of distant met.   CURRENT THERAPY:  started Carboplatin/Etoposide 02/09/2011 on days 1,2, and 3 of q3wk cycle; with Neulasta on day 4 to decrease risk of neutropenic infection.   INTERVAL HISTORY: Gerald [name] 70 y.o. male returns for regular follow up with a daughter.  He was seen here last week when he was having high grade fever and URI sx.  Given that he was at high risk of infection, he was given a course of Levaquin which he finished yesterday.  His symptoms have completely resolved since.  His strength has also improved as is his appetite.  He no longer has headache or vertigo.    Patient denies fatigue, headache, visual changes, confusion, drenching night sweats, palpable lymph node swelling, mucositis, odynophagia, dysphagia, nausea vomiting, jaundice, chest pain, palpitation, shortness of breath, dyspnea on exertion, productive cough, gum bleeding, epistaxis, hematemesis, hemoptysis, abdominal pain, abdominal swelling, early satiety, melena, hematochezia, hematuria, skin rash, spontaneous bleeding, joint swelling, joint pain, heat or cold intolerance, bowel bladder incontinence, back pain, focal motor weakness, paresthesia, depression, suicidal or homocidal ideation, feeling hopelessness.   MEDICAL HISTORY: Past Medical History  Diagnosis Date  . A-fib   . Hyperlipidemia   . Small cell lung cancer 12/2010  . Smoking   . CAD (coronary artery disease)     last cath 2008 with noncritical CAD  . Allergy     seasonal allergies  . GERD (gastroesophageal reflux disease)     ALLERGIES:   has no known allergies.  MEDICATIONS: Current Outpatient  Prescriptions  Medication Sig Dispense Refill  . aspirin 325 MG tablet Take 325 mg by mouth daily.        . digoxin (LANOXIN) 0.25 MG tablet Take 250 mcg by mouth daily.        Marland Kitchen levofloxacin (LEVAQUIN) 500 MG tablet Take 1 tablet (500 mg total) by mouth daily.  7 tablet  0  . meclizine (ANTIVERT) 12.5 MG tablet Take 1 tablet (12.5 mg total) by mouth 3 (three) times daily as needed for dizziness.  30 tablet  3  . metoprolol tartrate (LOPRESSOR) 25 MG tablet Take 12.5 mg by mouth 2 (two) times daily.        Marland Kitchen Phenylephrine-Pheniramine-DM (THERAFLU COLD & COUGH PO) Take by mouth as needed.        . Rivaroxaban (XARELTO) 20 MG TABS Take 20 mg by mouth daily.       . simvastatin (ZOCOR) 40 MG tablet Take 40 mg by mouth at bedtime.        . benzonatate (TESSALON PERLES) 100 MG capsule Take 1 capsule (100 mg total) by mouth 3 (three) times daily as needed for cough.  30 capsule  0  . guaiFENesin-codeine (ROBITUSSIN AC) 100-10 MG/5ML syrup Take 5 mLs by mouth 3 (three) times daily as needed.         No current facility-administered medications for this visit.   Facility-Administered Medications Ordered in Other Visits  Medication Dose Route Frequency Provider Last Rate Last Dose  . CARBOplatin (PARAPLATIN) 330 mg in sodium chloride 0.9 % 100 mL chemo infusion  330 mg Intravenous Once Jethro Bolus, MD   330 mg at 03/07/11  1159  . dexamethasone (DECADRON) injection 20 mg  20 mg Intravenous Once Jethro Bolus, MD   20 mg at 03/07/11 1130  . etoposide (VEPESID) 140 mg in sodium chloride 0.9 % 500 mL chemo infusion  75 mg/m2 (Treatment Plan Actual) Intravenous Once Jethro Bolus, MD   140 mg at 03/07/11 1234  . ondansetron (ZOFRAN) IVPB 16 mg  16 mg Intravenous Once Jethro Bolus, MD   16 mg at 03/07/11 1130  . DISCONTD: 0.9 %  sodium chloride infusion   Intravenous Once Jethro Bolus, MD        SURGICAL HISTORY:  Past Surgical History  Procedure Date  . Catherization 2008    cardiac catherization    REVIEW OF SYSTEMS:   Pertinent items are noted in HPI.   Filed Vitals:   03/07/11 1032  BP: 107/69  Pulse: 53  Temp: 97.3 F (36.3 C)   Wt Readings from Last 3 Encounters:  03/07/11 150 lb (68.04 kg)  03/07/11 150 lb 4.8 oz (68.176 kg)  03/01/11 153 lb 9.6 oz (69.673 kg)   ECOG 1  PHYSICAL EXAMINATION:  General: thin-appearing man in no acute distress. Eyes: no scleral icterus. ENT: There were no oropharyngeal lesions. Neck was without thyromegaly. Lymphatics: Negative cervical, supraclavicular or axillary adenopathy. Respiratory: lungs were clear bilaterally without wheezing or crackles. Cardiovascular: Regular rate and rhythm, S1/S2, without murmur, rub or gallop. There was no pedal edema. GI: abdomen was soft, flat, nontender, nondistended, without organomegaly. Muscoloskeletal: no spinal tenderness of palpation of vertebral spine. Skin exam was without echymosis, petichae. Neuro exam was nonfocal with strength 5/5; normal tendon reflex. Patient was able to get on and off exam table without assistance. Gait was normal. Patient was alerted and oriented. Attention was good. Language was appropriate. Mood was normal without depression. Speech was not pressured. Thought content was not tangential.   LABORATORY/RADIOLOGY DATA:  Lab Results  Component Value Date   WBC 5.4 03/07/2011   HGB 11.4* 03/07/2011   HCT 32.4* 03/07/2011   PLT 390 03/07/2011   GLUCOSE 111* 03/01/2011   GLUCOSE 111* 03/01/2011   CHOL  Value: 121        ATP III CLASSIFICATION:  <200     mg/dL   Desirable  952-841  mg/dL   Borderline High  >=324    mg/dL   High        06/27/270   TRIG 29 09/11/2009   HDL 41 09/11/2009   LDLCALC  Value: 74        Total Cholesterol/HDL:CHD Risk Coronary Heart Disease Risk Table                     Men   Women  1/2 Average Risk   3.4   3.3  Average Risk       5.0   4.4  2 X Average Risk   9.6   7.1  3 X Average Risk  23.4   11.0        Use the calculated Patient Ratio above and the CHD Risk Table to determine  the patient's CHD Risk.        ATP III CLASSIFICATION (LDL):  <100     mg/dL   Optimal  536-644  mg/dL   Near or Above                    Optimal  130-159  mg/dL   Borderline  034-742  mg/dL   High  >595  mg/dL   Very High 1/61/0960   ALT 23 03/01/2011   ALT 23 03/01/2011   AST 22 03/01/2011   AST 22 03/01/2011   NA 136 03/01/2011   NA 136 03/01/2011   K 3.9 03/01/2011   K 3.9 03/01/2011   CL 99 03/01/2011   CL 99 03/01/2011   CREATININE 1.13 03/01/2011   CREATININE 1.13 03/01/2011   BUN 12 03/01/2011   BUN 12 03/01/2011   CO2 24 03/01/2011   CO2 24 03/01/2011   TSH 2.201 01/23/2011   INR 0.99 01/26/2011   HGBA1C 5.8* 01/23/2011    ASSESSMENT AND PLAN:   1. Small cell lung cancer:  He is status post 1 cycle of chemotherapy with smaller size of lung mass on CXR.  I informed Gerald [name] and his daughter that there was early sign of response.  They were very happy to hear this good news.  Given that he had grade 2-3 fatigue and still some weight loss with only 1 cycle of chemo 1 month ago, I reccommended to proceed with cycle #2 today; however, at 25% dose reduction.  He will return on day #4 of this cycle of chemo for Neulasta to decrease the risk of Neutropenic fever.  I advised his daughter that people who live in the same household as patient should have inflenza vaccination to protect patient from having this again.   2. 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 His symptoms now have resolved.  He no longer needs morphine sulfate and Antivert.   3.  Afib:  He is on rate control with his PCP and cardiologist. He was on Coumadin which was discontinued.  He was recently started on Xarelto last week when he had Afib with RVR.  His cardiologist and I discussed the pro and con; and agreed to continue Xarelto for now and will rediscuss if his Plt is <80K from chemo.   4. History of smoking: he no longer smokes cigarettes.   5. Code  status: full code for now.  May need to readdress in the future if his performance status worsens.   6.  Weight loss:  Due to last week URI.  His appetite is improving.  I again stressed the importance of weight stability, and again advised him to start Ensure or some form of Boost.   7. Follow up:  Follow with me on 03/30/11.  His 3rd cycle of chemo will be delayed for a few days due to holidays.

## 2011-03-07 NOTE — Telephone Encounter (Signed)
Sent dr ha an email regarding this pt needing another f/u appt

## 2011-03-08 ENCOUNTER — Telehealth: Payer: Self-pay | Admitting: Oncology

## 2011-03-08 ENCOUNTER — Ambulatory Visit (HOSPITAL_BASED_OUTPATIENT_CLINIC_OR_DEPARTMENT_OTHER): Payer: Medicare Other

## 2011-03-08 VITALS — BP 109/68 | HR 53 | Temp 97.0°F

## 2011-03-08 DIAGNOSIS — C342 Malignant neoplasm of middle lobe, bronchus or lung: Secondary | ICD-10-CM

## 2011-03-08 DIAGNOSIS — Z5111 Encounter for antineoplastic chemotherapy: Secondary | ICD-10-CM

## 2011-03-08 DIAGNOSIS — C3490 Malignant neoplasm of unspecified part of unspecified bronchus or lung: Secondary | ICD-10-CM

## 2011-03-08 MED ORDER — PROCHLORPERAZINE MALEATE 10 MG PO TABS
10.0000 mg | ORAL_TABLET | Freq: Once | ORAL | Status: AC
  Administered 2011-03-08: 10 mg via ORAL

## 2011-03-08 MED ORDER — SODIUM CHLORIDE 0.9 % IV SOLN
Freq: Once | INTRAVENOUS | Status: AC
  Administered 2011-03-08: 15:00:00 via INTRAVENOUS

## 2011-03-08 MED ORDER — SODIUM CHLORIDE 0.9 % IV SOLN
75.0000 mg/m2 | Freq: Once | INTRAVENOUS | Status: AC
  Administered 2011-03-08: 140 mg via INTRAVENOUS
  Filled 2011-03-08: qty 7

## 2011-03-08 NOTE — Telephone Encounter (Signed)
gve the pt's son the dec,jan 2013 appt calendar.

## 2011-03-08 NOTE — Patient Instructions (Signed)
1635 Pt's son verbalized understanding of next appt date/time

## 2011-03-09 ENCOUNTER — Ambulatory Visit (HOSPITAL_BASED_OUTPATIENT_CLINIC_OR_DEPARTMENT_OTHER): Payer: Medicare Other

## 2011-03-09 VITALS — BP 102/66 | HR 73 | Temp 97.5°F

## 2011-03-09 DIAGNOSIS — C3490 Malignant neoplasm of unspecified part of unspecified bronchus or lung: Secondary | ICD-10-CM

## 2011-03-09 DIAGNOSIS — C342 Malignant neoplasm of middle lobe, bronchus or lung: Secondary | ICD-10-CM

## 2011-03-09 DIAGNOSIS — Z5111 Encounter for antineoplastic chemotherapy: Secondary | ICD-10-CM

## 2011-03-09 MED ORDER — SODIUM CHLORIDE 0.9 % IV SOLN
75.0000 mg/m2 | Freq: Once | INTRAVENOUS | Status: AC
  Administered 2011-03-09: 140 mg via INTRAVENOUS
  Filled 2011-03-09: qty 7

## 2011-03-09 MED ORDER — PROCHLORPERAZINE MALEATE 10 MG PO TABS
10.0000 mg | ORAL_TABLET | Freq: Once | ORAL | Status: AC
  Administered 2011-03-09: 10 mg via ORAL

## 2011-03-10 ENCOUNTER — Ambulatory Visit (HOSPITAL_BASED_OUTPATIENT_CLINIC_OR_DEPARTMENT_OTHER): Payer: Medicare Other

## 2011-03-10 VITALS — BP 97/67 | HR 54 | Temp 97.0°F

## 2011-03-10 DIAGNOSIS — C342 Malignant neoplasm of middle lobe, bronchus or lung: Secondary | ICD-10-CM

## 2011-03-10 DIAGNOSIS — C3490 Malignant neoplasm of unspecified part of unspecified bronchus or lung: Secondary | ICD-10-CM

## 2011-03-10 MED ORDER — PEGFILGRASTIM INJECTION 6 MG/0.6ML
6.0000 mg | Freq: Once | SUBCUTANEOUS | Status: AC
  Administered 2011-03-10: 6 mg via SUBCUTANEOUS
  Filled 2011-03-10: qty 0.6

## 2011-03-30 ENCOUNTER — Telehealth: Payer: Self-pay | Admitting: Oncology

## 2011-03-30 ENCOUNTER — Ambulatory Visit (HOSPITAL_BASED_OUTPATIENT_CLINIC_OR_DEPARTMENT_OTHER): Payer: Medicare Other

## 2011-03-30 ENCOUNTER — Other Ambulatory Visit: Payer: Medicare Other | Admitting: Lab

## 2011-03-30 ENCOUNTER — Ambulatory Visit: Payer: Medicare Other | Admitting: Oncology

## 2011-03-30 VITALS — BP 124/78 | HR 59 | Temp 98.5°F | Ht 69.0 in | Wt 153.3 lb

## 2011-03-30 DIAGNOSIS — Z87891 Personal history of nicotine dependence: Secondary | ICD-10-CM

## 2011-03-30 DIAGNOSIS — C3490 Malignant neoplasm of unspecified part of unspecified bronchus or lung: Secondary | ICD-10-CM

## 2011-03-30 DIAGNOSIS — Z5111 Encounter for antineoplastic chemotherapy: Secondary | ICD-10-CM

## 2011-03-30 DIAGNOSIS — R51 Headache: Secondary | ICD-10-CM

## 2011-03-30 DIAGNOSIS — I4891 Unspecified atrial fibrillation: Secondary | ICD-10-CM

## 2011-03-30 LAB — CBC WITH DIFFERENTIAL/PLATELET
BASO%: 0.8 % (ref 0.0–2.0)
Basophils Absolute: 0.1 10*3/uL (ref 0.0–0.1)
EOS%: 5.4 % (ref 0.0–7.0)
Eosinophils Absolute: 0.4 10*3/uL (ref 0.0–0.5)
HCT: 34 % — ABNORMAL LOW (ref 38.4–49.9)
HGB: 11.7 g/dL — ABNORMAL LOW (ref 13.0–17.1)
LYMPH%: 22.4 % (ref 14.0–49.0)
MCH: 31.7 pg (ref 27.2–33.4)
MCHC: 34.4 g/dL (ref 32.0–36.0)
MCV: 92.1 fL (ref 79.3–98.0)
MONO#: 1.2 10*3/uL — ABNORMAL HIGH (ref 0.1–0.9)
MONO%: 17.3 % — ABNORMAL HIGH (ref 0.0–14.0)
NEUT#: 3.6 10*3/uL (ref 1.5–6.5)
NEUT%: 54.1 % (ref 39.0–75.0)
Platelets: 187 10*3/uL (ref 140–400)
RBC: 3.69 10*6/uL — ABNORMAL LOW (ref 4.20–5.82)
RDW: 16.4 % — ABNORMAL HIGH (ref 11.0–14.6)
WBC: 6.7 10*3/uL (ref 4.0–10.3)
lymph#: 1.5 10*3/uL (ref 0.9–3.3)

## 2011-03-30 LAB — COMPREHENSIVE METABOLIC PANEL
ALT: 18 U/L (ref 0–53)
AST: 21 U/L (ref 0–37)
Albumin: 4.3 g/dL (ref 3.5–5.2)
Alkaline Phosphatase: 76 U/L (ref 39–117)
BUN: 17 mg/dL (ref 6–23)
CO2: 23 mEq/L (ref 19–32)
Calcium: 9 mg/dL (ref 8.4–10.5)
Chloride: 106 mEq/L (ref 96–112)
Creatinine, Ser: 0.93 mg/dL (ref 0.50–1.35)
Glucose, Bld: 92 mg/dL (ref 70–99)
Potassium: 4.1 mEq/L (ref 3.5–5.3)
Sodium: 140 mEq/L (ref 135–145)
Total Bilirubin: 0.4 mg/dL (ref 0.3–1.2)
Total Protein: 6.9 g/dL (ref 6.0–8.3)

## 2011-03-30 MED ORDER — DEXAMETHASONE SODIUM PHOSPHATE 4 MG/ML IJ SOLN
20.0000 mg | Freq: Once | INTRAMUSCULAR | Status: AC
  Administered 2011-03-30: 20 mg via INTRAVENOUS

## 2011-03-30 MED ORDER — SODIUM CHLORIDE 0.9 % IV SOLN
Freq: Once | INTRAVENOUS | Status: AC
  Administered 2011-03-30: 14:00:00 via INTRAVENOUS

## 2011-03-30 MED ORDER — ONDANSETRON 16 MG/50ML IVPB (CHCC)
16.0000 mg | Freq: Once | INTRAVENOUS | Status: AC
  Administered 2011-03-30: 16 mg via INTRAVENOUS

## 2011-03-30 MED ORDER — SODIUM CHLORIDE 0.9 % IV SOLN
325.8750 mg | Freq: Once | INTRAVENOUS | Status: AC
  Administered 2011-03-30: 330 mg via INTRAVENOUS
  Filled 2011-03-30: qty 33

## 2011-03-30 MED ORDER — SODIUM CHLORIDE 0.9 % IV SOLN
75.0000 mg/m2 | Freq: Once | INTRAVENOUS | Status: AC
  Administered 2011-03-30: 140 mg via INTRAVENOUS
  Filled 2011-03-30: qty 7

## 2011-03-30 NOTE — Progress Notes (Signed)
[facility] OFFICE PROGRESS NOTE  [name], MD, MD [address] [place] [address]  DIAGNOSIS: extensive stage small cell lung cancer; diagnosed in Oct 2012.    Extensive stage due to extensive involvement of lung field, not candidate for concurrent radiation.   No evidence of distant met.   CURRENT THERAPY:  started Carboplatin/Etoposide 02/09/2011 on days 1,2, and 3 of q3wk cycle; with Neulasta on day 4 to decrease risk of neutropenic infection. Starting cycle #2, chemo dose was decreased by 25% due to worsening performance status and poor toleration of cycle #1.   INTERVAL HISTORY: Gerald [name] 72 y.o. male returns for regular follow up with a son.  He reports doing very well after the 2nd cycle of chemo 3 weeks ago.  His dose was reduced by 25% and he has been doing well.  He has been taking about 2 cans Ensure a day.  He has improved performance status.  He has been able to walk about the house more.  He denies nausea/vomiting, SOB, CP, mucositis, abdominal pain, skin rash, neuropathy.  In fact, he no longer has headache and dizziness.     MEDICAL HISTORY: Past Medical History  Diagnosis Date  . A-fib   . Hyperlipidemia   . Small cell lung cancer 12/2010  . Smoking   . CAD (coronary artery disease)     last cath 2008 with noncritical CAD  . Allergy     seasonal allergies  . GERD (gastroesophageal reflux disease)     ALLERGIES:   has no known allergies.  MEDICATIONS: Current Outpatient Prescriptions  Medication Sig Dispense Refill  . aspirin 325 MG tablet Take 325 mg by mouth daily.        . digoxin (LANOXIN) 0.25 MG tablet Take 250 mcg by mouth daily.        . metoprolol tartrate (LOPRESSOR) 25 MG tablet Take 12.5 mg by mouth 2 (two) times daily.        . Rivaroxaban (XARELTO) 20 MG TABS Take 20 mg by mouth daily.       . simvastatin (ZOCOR) 40 MG tablet Take 40 mg by mouth at bedtime.        . meclizine (ANTIVERT) 12.5 MG tablet Take 1 tablet  (12.5 mg total) by mouth 3 (three) times daily as needed for dizziness.  30 tablet  3   No current facility-administered medications for this visit.   Facility-Administered Medications Ordered in Other Visits  Medication Dose Route Frequency Provider Last Rate Last Dose  . 0.9 %  sodium chloride infusion   Intravenous Once Jethro Bolus, MD      . CARBOplatin (PARAPLATIN) 330 mg in sodium chloride 0.9 % 100 mL chemo infusion  330 mg Intravenous Once Jethro Bolus, MD   330 mg at 03/30/11 1359  . dexamethasone (DECADRON) injection 20 mg  20 mg Intravenous Once Jethro Bolus, MD   20 mg at 03/30/11 1343  . etoposide (VEPESID) 140 mg in sodium chloride 0.9 % 500 mL chemo infusion  75 mg/m2 (Treatment Plan Actual) Intravenous Once Jethro Bolus, MD   140 mg at 03/30/11 1437  . ondansetron (ZOFRAN) IVPB 16 mg  16 mg Intravenous Once Jethro Bolus, MD   16 mg at 03/30/11 1343    SURGICAL HISTORY:  Past Surgical History  Procedure Date  . Catherization 2008    cardiac catherization    REVIEW OF SYSTEMS:  Pertinent items are noted in HPI.   Filed Vitals:  03/30/11 1210  BP: 124/78  Pulse: 59  Temp: 98.5 F (36.9 C)   Wt Readings from Last 3 Encounters:  03/30/11 153 lb 4.8 oz (69.536 kg)  03/07/11 150 lb (68.04 kg)  03/07/11 150 lb 4.8 oz (68.176 kg)   ECOG 1  PHYSICAL EXAMINATION:  General: thin-appearing man in no acute distress. Eyes: no scleral icterus. ENT: There were no oropharyngeal lesions. Neck was without thyromegaly. Lymphatics: Negative cervical, supraclavicular or axillary adenopathy. Respiratory: lungs were clear bilaterally without wheezing or crackles. Cardiovascular: Regular rate and rhythm, S1/S2, without murmur, rub or gallop. There was no pedal edema. GI: abdomen was soft, flat, nontender, nondistended, without organomegaly. Muscoloskeletal: no spinal tenderness of palpation of vertebral spine. Skin exam was without echymosis, petichae. Neuro exam was nonfocal with strength 5/5; normal tendon  reflex. Patient was able to get on and off exam table without assistance. Gait was normal. Patient was alerted and oriented. Attention was good. Language was appropriate. Mood was normal without depression. Speech was not pressured. Thought content was not tangential.   LABORATORY/RADIOLOGY DATA:  Lab Results  Component Value Date   WBC 6.7 03/30/2011   HGB 11.7* 03/30/2011   HCT 34.0* 03/30/2011   PLT 187 03/30/2011   GLUCOSE 92 03/30/2011   CHOL  Value: 121        ATP III CLASSIFICATION:  <200     mg/dL   Desirable  454-098  mg/dL   Borderline High  >=119    mg/dL   High        1/47/8295   TRIG 29 09/11/2009   HDL 41 09/11/2009   LDLCALC  Value: 74        Total Cholesterol/HDL:CHD Risk Coronary Heart Disease Risk Table                     Men   Women  1/2 Average Risk   3.4   3.3  Average Risk       5.0   4.4  2 X Average Risk   9.6   7.1  3 X Average Risk  23.4   11.0        Use the calculated Patient Ratio above and the CHD Risk Table to determine the patient's CHD Risk.        ATP III CLASSIFICATION (LDL):  <100     mg/dL   Optimal  621-308  mg/dL   Near or Above                    Optimal  130-159  mg/dL   Borderline  657-846  mg/dL   High  >962     mg/dL   Very High 9/52/8413   ALT 18 03/30/2011   AST 21 03/30/2011   NA 140 03/30/2011   K 4.1 03/30/2011   CL 106 03/30/2011   CREATININE 0.93 03/30/2011   BUN 17 03/30/2011   CO2 23 03/30/2011   TSH 2.201 01/23/2011   INR 0.99 01/26/2011   HGBA1C 5.8* 01/23/2011    ASSESSMENT AND PLAN:   1. Small cell lung cancer:  He is status post 2 cycles of chemotherapy.  After the 1st cycle, he had poor performanace status, thus, chemo dose was reduced by 25%.  Since then, he has been able to tolerate it better.  There was sign of tumor shrinkage after 1 cycle on CXR.   Thus, I will defer mid treatment CT scan and will obtain CT after 6 cycles.  As he did well with 2nd cycle, I recommended keeping chemo dose unchanged compared to the 2nd cycle.   2. Headache/vertigo:   possibly related to his small cell lung cancer which can be considered paraneoplastic syndrome. His symptoms now have resolved.   3.  Afib:  He is on rate control with his PCP and cardiologist. He is on Xarelto when he had Afib with RVR.  His cardiologist and I discussed the pro and con; and agreed to continue Xarelto for now and will rediscuss if his Plt is <80K from chemo.   4. History of smoking: he no longer smokes cigarettes.   5. Code status: full code for now.  May need to readdress in the future if his performance status worsens.   6.  Weight loss:  He is taking Ensure with weight gain.   7. Follow up:  Follow with me in 3 weeks before the 4th cycle of chemo.

## 2011-03-30 NOTE — Telephone Encounter (Signed)
appt made for 1/23 at 8:00 per dr ha ok to come in early.  rx set for 1/23 1/24 1/25 and inj on 1/26.  Printed for pt   aom

## 2011-03-31 ENCOUNTER — Ambulatory Visit (HOSPITAL_BASED_OUTPATIENT_CLINIC_OR_DEPARTMENT_OTHER): Payer: Medicare Other

## 2011-03-31 VITALS — BP 129/73 | HR 66 | Temp 98.3°F

## 2011-03-31 DIAGNOSIS — C342 Malignant neoplasm of middle lobe, bronchus or lung: Secondary | ICD-10-CM

## 2011-03-31 DIAGNOSIS — Z5111 Encounter for antineoplastic chemotherapy: Secondary | ICD-10-CM

## 2011-03-31 DIAGNOSIS — C3490 Malignant neoplasm of unspecified part of unspecified bronchus or lung: Secondary | ICD-10-CM

## 2011-03-31 MED ORDER — SODIUM CHLORIDE 0.9 % IV SOLN
75.0000 mg/m2 | Freq: Once | INTRAVENOUS | Status: AC
  Administered 2011-03-31: 140 mg via INTRAVENOUS
  Filled 2011-03-31: qty 7

## 2011-03-31 MED ORDER — SODIUM CHLORIDE 0.9 % IV SOLN
Freq: Once | INTRAVENOUS | Status: AC
  Administered 2011-03-31: 14:00:00 via INTRAVENOUS

## 2011-03-31 MED ORDER — PROCHLORPERAZINE MALEATE 10 MG PO TABS
10.0000 mg | ORAL_TABLET | Freq: Once | ORAL | Status: AC
Start: 2011-03-31 — End: 2011-03-31
  Administered 2011-03-31: 10 mg via ORAL

## 2011-03-31 NOTE — Patient Instructions (Signed)
Patient ambulatory out of clinic with assistance of cane.  No complaints at time of discharge.  Instructed patient and family member to call with any issues.  Patient aware of next appointment

## 2011-04-01 ENCOUNTER — Ambulatory Visit (HOSPITAL_BASED_OUTPATIENT_CLINIC_OR_DEPARTMENT_OTHER): Payer: Medicare Other

## 2011-04-01 VITALS — BP 125/67 | HR 65 | Temp 98.6°F

## 2011-04-01 DIAGNOSIS — Z5111 Encounter for antineoplastic chemotherapy: Secondary | ICD-10-CM

## 2011-04-01 DIAGNOSIS — C3490 Malignant neoplasm of unspecified part of unspecified bronchus or lung: Secondary | ICD-10-CM

## 2011-04-01 MED ORDER — ETOPOSIDE CHEMO INJECTION 20 MG/ML
75.0000 mg/m2 | Freq: Once | INTRAVENOUS | Status: AC
  Administered 2011-04-01: 140 mg via INTRAVENOUS
  Filled 2011-04-01: qty 7

## 2011-04-01 MED ORDER — PROCHLORPERAZINE EDISYLATE 5 MG/ML IJ SOLN: 10.0000 mg | Freq: Once | INTRAMUSCULAR | Status: DC

## 2011-04-01 MED ORDER — PROCHLORPERAZINE MALEATE 10 MG PO TABS
10.0000 mg | ORAL_TABLET | Freq: Once | ORAL | Status: AC
  Administered 2011-04-01: 10 mg via ORAL

## 2011-04-01 MED ORDER — SODIUM CHLORIDE 0.9 % IV SOLN
Freq: Once | INTRAVENOUS | Status: AC
  Administered 2011-04-01: 12:00:00 via INTRAVENOUS

## 2011-04-01 NOTE — Patient Instructions (Signed)
Patient discharged home with daughter; aware of next appointment.

## 2011-04-02 ENCOUNTER — Ambulatory Visit (HOSPITAL_BASED_OUTPATIENT_CLINIC_OR_DEPARTMENT_OTHER): Payer: Medicare Other

## 2011-04-02 VITALS — BP 104/69 | HR 60 | Temp 97.7°F

## 2011-04-02 DIAGNOSIS — C3490 Malignant neoplasm of unspecified part of unspecified bronchus or lung: Secondary | ICD-10-CM

## 2011-04-02 DIAGNOSIS — C342 Malignant neoplasm of middle lobe, bronchus or lung: Secondary | ICD-10-CM

## 2011-04-02 MED ORDER — PEGFILGRASTIM INJECTION 6 MG/0.6ML
6.0000 mg | Freq: Once | SUBCUTANEOUS | Status: AC
  Administered 2011-04-02: 6 mg via SUBCUTANEOUS

## 2011-04-04 ENCOUNTER — Encounter: Payer: Self-pay | Admitting: *Deleted

## 2011-04-04 NOTE — Progress Notes (Signed)
RECEIVED A FAX FROM [facility] CONCERNING A PRIOR AUTHORIZATION FOR ONDANSETRON. THIS REQUEST WAS PLACED IN THE MANAGED CARE BIN. 

## 2011-04-07 ENCOUNTER — Telehealth: Payer: Self-pay | Admitting: Oncology

## 2011-04-07 NOTE — Telephone Encounter (Signed)
Ondansetron 8mg  #30 has been approved from 04/06/10 until 03/27/12.

## 2011-04-13 ENCOUNTER — Encounter: Payer: Self-pay | Admitting: Dietician

## 2011-04-13 NOTE — Progress Notes (Signed)
Brief Out-patient Telephone Nutrition Assessment  Reason: Positive Nutrition Risk Screen  Past Medical History:  Past Medical History  Diagnosis Date  . A-fib   . Hyperlipidemia   . Small cell lung cancer 12/2010  . Smoking   . CAD (coronary artery disease)     last cath 2008 with noncritical CAD  . Allergy     seasonal allergies  . GERD (gastroesophageal reflux disease)     Patient Reports/ Assessment: Contacted patient via telephone due to patient positive nutrition risk screen for weight loss and decreased appetite. Spoke with patient's daughter. Patient's weight remains stable and appetite has improved since MD prescribed anti-nausea medication. She reported patient has good PO and drinks 2 Ensure daily. She was without any nutrition questions or concerns. Provided RD contact info.  Weight History: (02/08/11) 154 lb., (02/11/11) 150 lb., (03/01/11) 153.6 lb., (03/07/11) 150 lb.,  (03/30/11) 153.6 lb., (04/13/11) 152 lb.  No nutrition diagnosis at this time.  Height: 69" Weight: 152 lb.  UBW:155 lb. %IBW: 98% IBW:160 lb. %IBW: 95% BMI:  22.68  Intervention: Contacted patient via telephone for positive nutrition risk screen. Spoke with patient's daughter. Patient weight stable and appetite has improved. She reported no nutrition questions or concerns at this time. Provided RD contact info.  Iven Finn, MS, RD, LDN Pager # [phone]

## 2011-04-20 ENCOUNTER — Ambulatory Visit (HOSPITAL_BASED_OUTPATIENT_CLINIC_OR_DEPARTMENT_OTHER): Payer: Medicare Other | Admitting: Oncology

## 2011-04-20 ENCOUNTER — Ambulatory Visit (HOSPITAL_BASED_OUTPATIENT_CLINIC_OR_DEPARTMENT_OTHER): Payer: Medicare Other

## 2011-04-20 ENCOUNTER — Ambulatory Visit: Payer: Medicare Other

## 2011-04-20 ENCOUNTER — Other Ambulatory Visit (HOSPITAL_BASED_OUTPATIENT_CLINIC_OR_DEPARTMENT_OTHER): Payer: Medicare Other | Admitting: Lab

## 2011-04-20 ENCOUNTER — Telehealth: Payer: Self-pay | Admitting: Oncology

## 2011-04-20 VITALS — BP 106/68 | HR 59 | Temp 97.2°F | Ht 69.0 in | Wt 152.1 lb

## 2011-04-20 DIAGNOSIS — C3490 Malignant neoplasm of unspecified part of unspecified bronchus or lung: Secondary | ICD-10-CM

## 2011-04-20 DIAGNOSIS — C342 Malignant neoplasm of middle lobe, bronchus or lung: Secondary | ICD-10-CM

## 2011-04-20 DIAGNOSIS — Z5111 Encounter for antineoplastic chemotherapy: Secondary | ICD-10-CM

## 2011-04-20 DIAGNOSIS — R634 Abnormal weight loss: Secondary | ICD-10-CM

## 2011-04-20 LAB — CBC WITH DIFFERENTIAL/PLATELET
BASO%: 1 % (ref 0.0–2.0)
Basophils Absolute: 0.1 10*3/uL (ref 0.0–0.1)
EOS%: 4.7 % (ref 0.0–7.0)
Eosinophils Absolute: 0.3 10*3/uL (ref 0.0–0.5)
HCT: 33.7 % — ABNORMAL LOW (ref 38.4–49.9)
HGB: 11.5 g/dL — ABNORMAL LOW (ref 13.0–17.1)
LYMPH%: 22.2 % (ref 14.0–49.0)
MCH: 32.8 pg (ref 27.2–33.4)
MCHC: 34.1 g/dL (ref 32.0–36.0)
MCV: 96 fL (ref 79.3–98.0)
MONO#: 0.9 10*3/uL (ref 0.1–0.9)
MONO%: 15.1 % — ABNORMAL HIGH (ref 0.0–14.0)
NEUT#: 3.5 10*3/uL (ref 1.5–6.5)
NEUT%: 57 % (ref 39.0–75.0)
Platelets: 212 10*3/uL (ref 140–400)
RBC: 3.51 10*6/uL — ABNORMAL LOW (ref 4.20–5.82)
RDW: 17.5 % — ABNORMAL HIGH (ref 11.0–14.6)
WBC: 6.2 10*3/uL (ref 4.0–10.3)
lymph#: 1.4 10*3/uL (ref 0.9–3.3)
nRBC: 0 % (ref 0–0)

## 2011-04-20 LAB — COMPREHENSIVE METABOLIC PANEL
ALT: 23 U/L (ref 0–53)
AST: 18 U/L (ref 0–37)
Albumin: 4.2 g/dL (ref 3.5–5.2)
Alkaline Phosphatase: 83 U/L (ref 39–117)
BUN: 14 mg/dL (ref 6–23)
CO2: 27 mEq/L (ref 19–32)
Calcium: 8.8 mg/dL (ref 8.4–10.5)
Chloride: 107 mEq/L (ref 96–112)
Creatinine, Ser: 0.95 mg/dL (ref 0.50–1.35)
Glucose, Bld: 103 mg/dL — ABNORMAL HIGH (ref 70–99)
Potassium: 4.7 mEq/L (ref 3.5–5.3)
Sodium: 140 mEq/L (ref 135–145)
Total Bilirubin: 0.3 mg/dL (ref 0.3–1.2)
Total Protein: 6.7 g/dL (ref 6.0–8.3)

## 2011-04-20 MED ORDER — SODIUM CHLORIDE 0.9 % IV SOLN: Freq: Once | INTRAVENOUS | Status: DC

## 2011-04-20 MED ORDER — DEXAMETHASONE SODIUM PHOSPHATE 4 MG/ML IJ SOLN
20.0000 mg | Freq: Once | INTRAMUSCULAR | Status: AC
  Administered 2011-04-20: 20 mg via INTRAVENOUS

## 2011-04-20 MED ORDER — ONDANSETRON 16 MG/50ML IVPB (CHCC)
16.0000 mg | Freq: Once | INTRAVENOUS | Status: AC
  Administered 2011-04-20: 16 mg via INTRAVENOUS

## 2011-04-20 MED ORDER — SODIUM CHLORIDE 0.9 % IV SOLN
75.0000 mg/m2 | Freq: Once | INTRAVENOUS | Status: AC
  Administered 2011-04-20: 140 mg via INTRAVENOUS
  Filled 2011-04-20: qty 7

## 2011-04-20 MED ORDER — SODIUM CHLORIDE 0.9 % IV SOLN
325.8750 mg | Freq: Once | INTRAVENOUS | Status: AC
  Administered 2011-04-20: 330 mg via INTRAVENOUS
  Filled 2011-04-20: qty 33

## 2011-04-20 NOTE — Progress Notes (Signed)
[facility] OFFICE PROGRESS NOTE  [name], MD, MD [address] [place] [address]  DIAGNOSIS: extensive stage small cell lung cancer; diagnosed in Oct 2012.    Extensive stage due to extensive involvement of lung field, not candidate for concurrent radiation.   No evidence of distant met.   CURRENT THERAPY:  started Carboplatin/Etoposide 02/09/2011 on days 1,2, and 3 of q3wk cycle; with Neulasta on day 4 to decrease risk of neutropenic infection. Starting cycle #2, chemo dose was decreased by 25% due to worsening performance status and poor toleration of cycle #1.   INTERVAL HISTORY: Nikolaus Towell 71 y.o. male returns for regular follow up with a son.  He reports doing very well after the 3rd cycle of chemo 3 weeks ago. He tries his best but is not able to take in more than 2 cans of Ensure a day.  He is eating as much solid foods as possible.  His weight is stable.  He works out on stationary bike everyday about 15-20 min.  He has mild nausea without frank vomiting. He denies SOB, CP, fever, mucositis, bleeding symptom, neuropathy, diarrhea, constipation, low back pain, skin rash, bowel bladder incontinence, heat or cold intolerance.   He no longer has headache and dizziness.     MEDICAL HISTORY: Past Medical History  Diagnosis Date  . A-fib   . Hyperlipidemia   . Small cell lung cancer 12/2010  . Smoking   . CAD (coronary artery disease)     last cath 2008 with noncritical CAD  . Allergy     seasonal allergies  . GERD (gastroesophageal reflux disease)     ALLERGIES:   has no known allergies.  MEDICATIONS: Current Outpatient Prescriptions  Medication Sig Dispense Refill  . aspirin 325 MG tablet Take 325 mg by mouth daily.        . digoxin (LANOXIN) 0.125 MG tablet Take 125 mcg by mouth daily.      . meclizine (ANTIVERT) 12.5 MG tablet Take 1 tablet (12.5 mg total) by mouth 3 (three) times daily as needed for dizziness.  30 tablet  3  . metoprolol  tartrate (LOPRESSOR) 25 MG tablet Take 12.5 mg by mouth 2 (two) times daily.        . ondansetron (ZOFRAN) 8 MG tablet Take by mouth 2 (two) times daily.      . Rivaroxaban (XARELTO) 20 MG TABS Take 20 mg by mouth daily.       . simvastatin (ZOCOR) 40 MG tablet Take 40 mg by mouth at bedtime.        . digoxin (LANOXIN) 0.25 MG tablet Take 250 mcg by mouth daily.         No current facility-administered medications for this visit.   Facility-Administered Medications Ordered in Other Visits  Medication Dose Route Frequency Provider Last Rate Last Dose  . 0.9 %  sodium chloride infusion   Intravenous Once Jethro Bolus, MD      . CARBOplatin (PARAPLATIN) 330 mg in sodium chloride 0.9 % 100 mL chemo infusion  330 mg Intravenous Once Jethro Bolus, MD   330 mg at 04/20/11 0943  . dexamethasone (DECADRON) injection 20 mg  20 mg Intravenous Once Jethro Bolus, MD   20 mg at 04/20/11 0911  . etoposide (VEPESID) 140 mg in sodium chloride 0.9 % 500 mL chemo infusion  75 mg/m2 (Treatment Plan Actual) Intravenous Once Jethro Bolus, MD   140 mg at 04/20/11 1014  . ondansetron (ZOFRAN) IVPB  16 mg  16 mg Intravenous Once Jethro Bolus, MD   16 mg at 04/20/11 0911    SURGICAL HISTORY:  Past Surgical History  Procedure Date  . Catherization 2008    cardiac catherization    REVIEW OF SYSTEMS:  Pertinent items are noted in HPI.   Filed Vitals:   04/20/11 0843  BP: 106/68  Pulse: 59  Temp: 97.2 F (36.2 C)   Wt Readings from Last 3 Encounters:  04/20/11 152 lb 1.6 oz (68.992 kg)  04/13/11 152 lb (68.947 kg)  03/30/11 153 lb 4.8 oz (69.536 kg)   ECOG 1  PHYSICAL EXAMINATION:  General: thin-appearing man in no acute distress. Eyes: no scleral icterus. ENT: There were no oropharyngeal lesions. Neck was without thyromegaly. Lymphatics: Negative cervical, supraclavicular or axillary adenopathy. Respiratory: lungs were clear bilaterally without wheezing or crackles. Cardiovascular: Regular rate and rhythm, S1/S2, without murmur,  rub or gallop. There was no pedal edema. GI: abdomen was soft, flat, nontender, nondistended, without organomegaly. Muscoloskeletal: no spinal tenderness of palpation of vertebral spine. Skin exam was without echymosis, petichae. Neuro exam was nonfocal with strength 5/5; normal tendon reflex. Patient was able to get on and off exam table without assistance. Gait was normal. Patient was alerted and oriented. Attention was good. Language was appropriate. Mood was normal without depression. Speech was not pressured. Thought content was not tangential.   LABORATORY/RADIOLOGY DATA:  Lab Results  Component Value Date   WBC 6.2 04/20/2011   HGB 11.5* 04/20/2011   HCT 33.7* 04/20/2011   PLT 212 04/20/2011   GLUCOSE 92 03/30/2011   CHOL  Value: 121        ATP III CLASSIFICATION:  <200     mg/dL   Desirable  409-811  mg/dL   Borderline High  >=914    mg/dL   High        7/82/9562   TRIG 29 09/11/2009   HDL 41 09/11/2009   LDLCALC  Value: 74        Total Cholesterol/HDL:CHD Risk Coronary Heart Disease Risk Table                     Men   Women  1/2 Average Risk   3.4   3.3  Average Risk       5.0   4.4  2 X Average Risk   9.6   7.1  3 X Average Risk  23.4   11.0        Use the calculated Patient Ratio above and the CHD Risk Table to determine the patient's CHD Risk.        ATP III CLASSIFICATION (LDL):  <100     mg/dL   Optimal  130-865  mg/dL   Near or Above                    Optimal  130-159  mg/dL   Borderline  784-696  mg/dL   High  >295     mg/dL   Very High 2/84/1324   ALT 18 03/30/2011   AST 21 03/30/2011   NA 140 03/30/2011   K 4.1 03/30/2011   CL 106 03/30/2011   CREATININE 0.93 03/30/2011   BUN 17 03/30/2011   CO2 23 03/30/2011   TSH 2.201 01/23/2011   INR 0.99 01/26/2011   HGBA1C 5.8* 01/23/2011    ASSESSMENT AND PLAN:   1. Small cell lung cancer:  He is status post 3 cycles of chemotherapy.  After the 1st cycle, he had poor performanace status, thus, chemo dose was reduced by 25%.  Since then, he has been  able to tolerate it well. There was sign of tumor shrinkage after 1 cycle on CXR.   Thus, I will defer mid treatment CT scan and will obtain CT after 6 cycles.  As he did well with 2nd and 3rd cycles, I recommended keeping chemo dose unchanged compared to the 2nd cycle.   2. 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 His symptoms now have resolved.   3.  Afib:  He is on rate control with his PCP and cardiologist. He is on Xarelto when he had Afib with RVR.  His cardiologist and I discussed the pro and con; and agreed to continue Xarelto for now and will rediscuss if his Plt is <80K from chemo.   4. History of smoking: he no longer smokes cigarettes.   5. Code status: FULL CODE.   6.  Weight loss:  He has no taste for Ensure.  I encouraged him to take more solid foods.   7. Follow up:  Follow with me in 3 weeks before the 5th cycle of chemo.     The length of time of the face-to-face encounter was 15 minutes. More than 50% of time was spent counseling and coordination of care.

## 2011-04-20 NOTE — Telephone Encounter (Signed)
appts made and printed for 2/13 md visit and 2/14 2/15 tx and 2/16 inj

## 2011-04-21 ENCOUNTER — Ambulatory Visit (HOSPITAL_BASED_OUTPATIENT_CLINIC_OR_DEPARTMENT_OTHER): Payer: Medicare Other

## 2011-04-21 VITALS — BP 107/60 | HR 63

## 2011-04-21 DIAGNOSIS — C3490 Malignant neoplasm of unspecified part of unspecified bronchus or lung: Secondary | ICD-10-CM

## 2011-04-21 DIAGNOSIS — Z5111 Encounter for antineoplastic chemotherapy: Secondary | ICD-10-CM

## 2011-04-21 MED ORDER — SODIUM CHLORIDE 0.9 % IV SOLN
Freq: Once | INTRAVENOUS | Status: AC
  Administered 2011-04-21: 13:00:00 via INTRAVENOUS

## 2011-04-21 MED ORDER — SODIUM CHLORIDE 0.9 % IV SOLN
75.0000 mg/m2 | Freq: Once | INTRAVENOUS | Status: AC
  Administered 2011-04-21: 140 mg via INTRAVENOUS
  Filled 2011-04-21: qty 7

## 2011-04-21 MED ORDER — PROCHLORPERAZINE MALEATE 10 MG PO TABS
10.0000 mg | ORAL_TABLET | Freq: Once | ORAL | Status: AC
  Administered 2011-04-21: 10 mg via ORAL

## 2011-04-22 ENCOUNTER — Ambulatory Visit (HOSPITAL_BASED_OUTPATIENT_CLINIC_OR_DEPARTMENT_OTHER): Payer: Medicare Other

## 2011-04-22 VITALS — BP 92/57 | HR 60 | Temp 98.7°F

## 2011-04-22 DIAGNOSIS — Z5111 Encounter for antineoplastic chemotherapy: Secondary | ICD-10-CM

## 2011-04-22 DIAGNOSIS — C342 Malignant neoplasm of middle lobe, bronchus or lung: Secondary | ICD-10-CM

## 2011-04-22 DIAGNOSIS — C3490 Malignant neoplasm of unspecified part of unspecified bronchus or lung: Secondary | ICD-10-CM

## 2011-04-22 MED ORDER — PROCHLORPERAZINE MALEATE 10 MG PO TABS
10.0000 mg | ORAL_TABLET | Freq: Once | ORAL | Status: AC
  Administered 2011-04-22: 10 mg via ORAL

## 2011-04-22 MED ORDER — PROCHLORPERAZINE EDISYLATE 5 MG/ML IJ SOLN: 10.0000 mg | Freq: Once | INTRAMUSCULAR | Status: DC

## 2011-04-22 MED ORDER — SODIUM CHLORIDE 0.9 % IV SOLN
75.0000 mg/m2 | Freq: Once | INTRAVENOUS | Status: AC
  Administered 2011-04-22: 140 mg via INTRAVENOUS
  Filled 2011-04-22: qty 7

## 2011-04-22 MED ORDER — SODIUM CHLORIDE 0.9 % IV SOLN
Freq: Once | INTRAVENOUS | Status: AC
  Administered 2011-04-22: 14:00:00 via INTRAVENOUS

## 2011-04-23 ENCOUNTER — Ambulatory Visit (HOSPITAL_BASED_OUTPATIENT_CLINIC_OR_DEPARTMENT_OTHER): Payer: Medicare Other

## 2011-04-23 VITALS — BP 104/67 | HR 68 | Temp 99.0°F

## 2011-04-23 DIAGNOSIS — C3490 Malignant neoplasm of unspecified part of unspecified bronchus or lung: Secondary | ICD-10-CM

## 2011-04-23 MED ORDER — PEGFILGRASTIM INJECTION 6 MG/0.6ML
6.0000 mg | Freq: Once | SUBCUTANEOUS | Status: AC
  Administered 2011-04-23: 6 mg via SUBCUTANEOUS

## 2011-04-23 NOTE — Patient Instructions (Signed)
Pt discharged home with family

## 2011-04-25 ENCOUNTER — Other Ambulatory Visit: Payer: Self-pay | Admitting: Certified Registered Nurse Anesthetist

## 2011-05-11 ENCOUNTER — Ambulatory Visit (HOSPITAL_BASED_OUTPATIENT_CLINIC_OR_DEPARTMENT_OTHER): Payer: Medicare Other | Admitting: Oncology

## 2011-05-11 ENCOUNTER — Ambulatory Visit (HOSPITAL_BASED_OUTPATIENT_CLINIC_OR_DEPARTMENT_OTHER): Payer: Medicare Other

## 2011-05-11 ENCOUNTER — Other Ambulatory Visit (HOSPITAL_BASED_OUTPATIENT_CLINIC_OR_DEPARTMENT_OTHER): Payer: Medicare Other

## 2011-05-11 VITALS — BP 113/76 | HR 61 | Temp 96.8°F | Wt 152.3 lb

## 2011-05-11 DIAGNOSIS — C342 Malignant neoplasm of middle lobe, bronchus or lung: Secondary | ICD-10-CM

## 2011-05-11 DIAGNOSIS — C3490 Malignant neoplasm of unspecified part of unspecified bronchus or lung: Secondary | ICD-10-CM

## 2011-05-11 DIAGNOSIS — Z5111 Encounter for antineoplastic chemotherapy: Secondary | ICD-10-CM

## 2011-05-11 DIAGNOSIS — I4891 Unspecified atrial fibrillation: Secondary | ICD-10-CM

## 2011-05-11 LAB — CBC WITH DIFFERENTIAL/PLATELET
BASO%: 0.8 % (ref 0.0–2.0)
Basophils Absolute: 0.1 10*3/uL (ref 0.0–0.1)
EOS%: 3.5 % (ref 0.0–7.0)
Eosinophils Absolute: 0.2 10*3/uL (ref 0.0–0.5)
HCT: 31.7 % — ABNORMAL LOW (ref 38.4–49.9)
HGB: 10.9 g/dL — ABNORMAL LOW (ref 13.0–17.1)
LYMPH%: 21 % (ref 14.0–49.0)
MCH: 33.3 pg (ref 27.2–33.4)
MCHC: 34.4 g/dL (ref 32.0–36.0)
MCV: 96.9 fL (ref 79.3–98.0)
MONO#: 1 10*3/uL — ABNORMAL HIGH (ref 0.1–0.9)
MONO%: 15.5 % — ABNORMAL HIGH (ref 0.0–14.0)
NEUT#: 3.9 10*3/uL (ref 1.5–6.5)
NEUT%: 59.2 % (ref 39.0–75.0)
Platelets: 234 10*3/uL (ref 140–400)
RBC: 3.27 10*6/uL — ABNORMAL LOW (ref 4.20–5.82)
RDW: 16.8 % — ABNORMAL HIGH (ref 11.0–14.6)
WBC: 6.6 10*3/uL (ref 4.0–10.3)
lymph#: 1.4 10*3/uL (ref 0.9–3.3)
nRBC: 0 % (ref 0–0)

## 2011-05-11 LAB — COMPREHENSIVE METABOLIC PANEL
ALT: 17 U/L (ref 0–53)
AST: 19 U/L (ref 0–37)
Albumin: 3.9 g/dL (ref 3.5–5.2)
Alkaline Phosphatase: 83 U/L (ref 39–117)
BUN: 16 mg/dL (ref 6–23)
CO2: 25 mEq/L (ref 19–32)
Calcium: 8.7 mg/dL (ref 8.4–10.5)
Chloride: 107 mEq/L (ref 96–112)
Creatinine, Ser: 0.95 mg/dL (ref 0.50–1.35)
Glucose, Bld: 119 mg/dL — ABNORMAL HIGH (ref 70–99)
Potassium: 4 mEq/L (ref 3.5–5.3)
Sodium: 139 mEq/L (ref 135–145)
Total Bilirubin: 0.3 mg/dL (ref 0.3–1.2)
Total Protein: 6.9 g/dL (ref 6.0–8.3)

## 2011-05-11 MED ORDER — SODIUM CHLORIDE 0.9 % IV SOLN
325.8750 mg | Freq: Once | INTRAVENOUS | Status: AC
  Administered 2011-05-11: 330 mg via INTRAVENOUS
  Filled 2011-05-11: qty 33

## 2011-05-11 MED ORDER — SODIUM CHLORIDE 0.9 % IV SOLN
Freq: Once | INTRAVENOUS | Status: AC
  Administered 2011-05-11: 09:00:00 via INTRAVENOUS

## 2011-05-11 MED ORDER — DEXAMETHASONE SODIUM PHOSPHATE 4 MG/ML IJ SOLN
20.0000 mg | Freq: Once | INTRAMUSCULAR | Status: AC
  Administered 2011-05-11: 20 mg via INTRAVENOUS

## 2011-05-11 MED ORDER — SODIUM CHLORIDE 0.9 % IV SOLN
75.0000 mg/m2 | Freq: Once | INTRAVENOUS | Status: AC
  Administered 2011-05-11: 140 mg via INTRAVENOUS
  Filled 2011-05-11: qty 7

## 2011-05-11 MED ORDER — ONDANSETRON 16 MG/50ML IVPB (CHCC)
16.0000 mg | Freq: Once | INTRAVENOUS | Status: AC
  Administered 2011-05-11: 16 mg via INTRAVENOUS

## 2011-05-11 NOTE — Progress Notes (Signed)
[facility] OFFICE PROGRESS NOTE  [name], MD, MD [address] [place] [address]  DIAGNOSIS: extensive stage small cell lung cancer; diagnosed in Oct 2012.    Extensive stage due to extensive involvement of lung field, not candidate for concurrent radiation.   No evidence of distant met.   CURRENT THERAPY:  started Carboplatin/Etoposide 02/09/2011 on days 1,2, and 3 of q3wk cycle; with Neulasta on day 4 to decrease risk of neutropenic infection. Starting cycle #2, chemo dose was decreased by 25% due to worsening performance status and poor toleration of cycle #1.   INTERVAL HISTORY: Gerald [name] 71 y.o. male returns for regular follow up with a daughter.  He reports doing very well.  He tolerates chemo well.  However, on day 5 -6 of each chemo cycle, he thinks that the Neulasta injection causes fatigue, nausea/vomiting and abdominal pain.  These symptoms resolve quickly.  He denies fever, headache, vertigo, SOB, CP, abdominal pain, bleeding symptoms, skin rash, bone/muscle pain. He exercises everyday with walking around his house for about 15-20 minutes without problem.   MEDICAL HISTORY: Past Medical History  Diagnosis Date  . A-fib   . Hyperlipidemia   . Small cell lung cancer 12/2010  . Smoking   . CAD (coronary artery disease)     last cath 2008 with noncritical CAD  . Allergy     seasonal allergies  . GERD (gastroesophageal reflux disease)     ALLERGIES:   has no known allergies.  MEDICATIONS: Current Outpatient Prescriptions  Medication Sig Dispense Refill  . aspirin 81 MG tablet Take 81 mg by mouth daily.      . metoprolol tartrate (LOPRESSOR) 25 MG tablet Take 25 mg by mouth 2 (two) times daily. 1 1/2 tab twice daily      . ondansetron (ZOFRAN) 8 MG tablet Take by mouth every 12 (twelve) hours as needed.       . Rivaroxaban (XARELTO) 20 MG TABS Take 20 mg by mouth daily.       . simvastatin (ZOCOR) 40 MG tablet Take 40 mg by mouth at bedtime.         . digoxin (LANOXIN) 0.25 MG tablet Take 250 mcg by mouth daily. 1/2 tablet daily       No current facility-administered medications for this visit.   Facility-Administered Medications Ordered in Other Visits  Medication Dose Route Frequency Provider Last Rate Last Dose  . 0.9 %  sodium chloride infusion   Intravenous Once Jethro Bolus, MD 20 mL/hr at 05/11/11 0856    . CARBOplatin (PARAPLATIN) 330 mg in sodium chloride 0.9 % 100 mL chemo infusion  330 mg Intravenous Once Jethro Bolus, MD      . dexamethasone (DECADRON) injection 20 mg  20 mg Intravenous Once Jethro Bolus, MD   20 mg at 05/11/11 0856  . etoposide (VEPESID) 140 mg in sodium chloride 0.9 % 500 mL chemo infusion  75 mg/m2 (Treatment Plan Actual) Intravenous Once Jethro Bolus, MD 507 mL/hr at 05/11/11 0919 140 mg at 05/11/11 0919  . ondansetron (ZOFRAN) IVPB 16 mg  16 mg Intravenous Once Jethro Bolus, MD   16 mg at 05/11/11 0856    SURGICAL HISTORY:  Past Surgical History  Procedure Date  . Catherization 2008    cardiac catherization    REVIEW OF SYSTEMS:  Pertinent items are noted in HPI.   Filed Vitals:   05/11/11 0827  BP: 113/76  Pulse: 61  Temp: 96.8 F (36 C)  Wt Readings from Last 3 Encounters:  05/11/11 152 lb 4.8 oz (69.083 kg)  04/20/11 152 lb 1.6 oz (68.992 kg)  04/13/11 152 lb (68.947 kg)   ECOG 1  PHYSICAL EXAMINATION:  General: thin-appearing man in no acute distress. Eyes: no scleral icterus. ENT: There were no oropharyngeal lesions. Neck was without thyromegaly. Lymphatics: Negative cervical, supraclavicular or axillary adenopathy. Respiratory: lungs were clear bilaterally without wheezing or crackles. Cardiovascular: Regular rate and rhythm, S1/S2, without murmur, rub or gallop. There was no pedal edema. GI: abdomen was soft, flat, nontender, nondistended, without organomegaly. Muscoloskeletal: no spinal tenderness of palpation of vertebral spine. Skin exam was without echymosis, petichae. Neuro exam was nonfocal with  strength 5/5; normal tendon reflex. Patient was able to get on and off exam table without assistance. Gait was normal. Patient was alerted and oriented. Attention was good. Language was appropriate. Mood was normal without depression. Speech was not pressured. Thought content was not tangential.   LABORATORY/RADIOLOGY DATA:  Lab Results  Component Value Date   WBC 6.6 05/11/2011   HGB 10.9* 05/11/2011   HCT 31.7* 05/11/2011   PLT 234 05/11/2011   GLUCOSE 103* 04/20/2011   CHOL  Value: 121        ATP III CLASSIFICATION:  <200     mg/dL   Desirable  409-811  mg/dL   Borderline High  >=914    mg/dL   High        7/82/9562   TRIG 29 09/11/2009   HDL 41 09/11/2009   LDLCALC  Value: 74        Total Cholesterol/HDL:CHD Risk Coronary Heart Disease Risk Table                     Men   Women  1/2 Average Risk   3.4   3.3  Average Risk       5.0   4.4  2 X Average Risk   9.6   7.1  3 X Average Risk  23.4   11.0        Use the calculated Patient Ratio above and the CHD Risk Table to determine the patient's CHD Risk.        ATP III CLASSIFICATION (LDL):  <100     mg/dL   Optimal  130-865  mg/dL   Near or Above                    Optimal  130-159  mg/dL   Borderline  784-696  mg/dL   High  >295     mg/dL   Very High 2/84/1324   ALT 23 04/20/2011   AST 18 04/20/2011   NA 140 04/20/2011   K 4.7 04/20/2011   CL 107 04/20/2011   CREATININE 0.95 04/20/2011   BUN 14 04/20/2011   CO2 27 04/20/2011   TSH 2.201 01/23/2011   INR 0.99 01/26/2011   HGBA1C 5.8* 01/23/2011    ASSESSMENT AND PLAN:   1. Small cell lung cancer:  He is status post 4 cycles of chemotherapy.  After the 1st cycle, he had poor performanace status, thus, chemo dose was reduced by 25%.  Since then, he has been able to tolerate it well. There was sign of tumor shrinkage after 1 cycle on CXR.   Thus, I will defer mid treatment CT scan and will obtain CT after 6 cycles.  As he did well with these subsequent cycles, I recommended keeping chemo dose unchanged  compared  to the 2nd cycle.  He has some symptoms after Neulasta injection which are quite transient.  I recommended no change.   2. 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 His symptoms now have resolved.   3.  Afib:  He is on rate control with his PCP and cardiologist. He is on Xarelto when he had Afib with RVR.  His cardiologist and I discussed the pro and con; and agreed to continue Xarelto for now and will rediscuss if his Plt is <80K from chemo.   4. History of smoking: he no longer smokes cigarettes.   5. Code status: FULL CODE.   6.  Weight loss:  He has no taste for Ensure.  I encouraged him to take more solid foods.  His weight is now stable.   7. Follow up:  Follow with me in 3 weeks before the 6th and final planned cycle of chemo.

## 2011-05-12 ENCOUNTER — Ambulatory Visit (HOSPITAL_BASED_OUTPATIENT_CLINIC_OR_DEPARTMENT_OTHER): Payer: Medicare Other

## 2011-05-12 ENCOUNTER — Other Ambulatory Visit: Payer: Self-pay | Admitting: *Deleted

## 2011-05-12 VITALS — BP 114/70 | HR 62 | Temp 97.7°F

## 2011-05-12 DIAGNOSIS — C3490 Malignant neoplasm of unspecified part of unspecified bronchus or lung: Secondary | ICD-10-CM

## 2011-05-12 DIAGNOSIS — Z5111 Encounter for antineoplastic chemotherapy: Secondary | ICD-10-CM

## 2011-05-12 MED ORDER — PROCHLORPERAZINE MALEATE 10 MG PO TABS
10.0000 mg | ORAL_TABLET | Freq: Once | ORAL | Status: AC
  Administered 2011-05-12: 10 mg via ORAL

## 2011-05-12 MED ORDER — SODIUM CHLORIDE 0.9 % IV SOLN
75.0000 mg/m2 | Freq: Once | INTRAVENOUS | Status: AC
  Administered 2011-05-12: 140 mg via INTRAVENOUS
  Filled 2011-05-12: qty 7

## 2011-05-12 MED ORDER — SODIUM CHLORIDE 0.9 % IV SOLN: Freq: Once | INTRAVENOUS | Status: DC

## 2011-05-12 NOTE — Patient Instructions (Signed)
Discharged with daughter. Pt has meds for nausea and is aware of his next appointment.

## 2011-05-13 ENCOUNTER — Ambulatory Visit (HOSPITAL_BASED_OUTPATIENT_CLINIC_OR_DEPARTMENT_OTHER): Payer: Medicare Other

## 2011-05-13 VITALS — BP 115/73 | HR 66 | Temp 97.9°F

## 2011-05-13 DIAGNOSIS — C3490 Malignant neoplasm of unspecified part of unspecified bronchus or lung: Secondary | ICD-10-CM

## 2011-05-13 DIAGNOSIS — Z5111 Encounter for antineoplastic chemotherapy: Secondary | ICD-10-CM

## 2011-05-13 MED ORDER — SODIUM CHLORIDE 0.9 % IV SOLN
Freq: Once | INTRAVENOUS | Status: AC
  Administered 2011-05-13: 12:00:00 via INTRAVENOUS

## 2011-05-13 MED ORDER — PROCHLORPERAZINE MALEATE 10 MG PO TABS
10.0000 mg | ORAL_TABLET | Freq: Once | ORAL | Status: AC
  Administered 2011-05-13: 10 mg via ORAL

## 2011-05-13 MED ORDER — SODIUM CHLORIDE 0.9 % IV SOLN
75.0000 mg/m2 | Freq: Once | INTRAVENOUS | Status: AC
  Administered 2011-05-13: 140 mg via INTRAVENOUS
  Filled 2011-05-13: qty 7

## 2011-05-13 NOTE — Patient Instructions (Signed)
[facility] Discharge Instructions for Patients Receiving Chemotherapy  Today you received the following chemotherapy agents [id number] To help prevent nausea and vomiting after your treatment, we encourage you to take your nausea medication as directed by MD  If you develop nausea and vomiting that is not controlled by your nausea medication, call the clinic. If it is after clinic hours your family physician or the after hours number for the clinic or go to the Emergency Department.   BELOW ARE SYMPTOMS THAT SHOULD BE REPORTED IMMEDIATELY:  *FEVER GREATER THAN 100.5 F  *CHILLS WITH OR WITHOUT FEVER  NAUSEA AND VOMITING THAT IS NOT CONTROLLED WITH YOUR NAUSEA MEDICATION  *UNUSUAL SHORTNESS OF BREATH  *UNUSUAL BRUISING OR BLEEDING  TENDERNESS IN MOUTH AND THROAT WITH OR WITHOUT PRESENCE OF ULCERS  *URINARY PROBLEMS  *BOWEL PROBLEMS  UNUSUAL RASH Items with * indicate a potential emergency and should be followed up as soon as possible.  One of the nurses will contact you 24 hours after your treatment. Please let the nurse know about any problems that you may have experienced. Feel free to call the clinic you have any questions or concerns. The clinic phone number is [phone].   I have been informed and understand all the instructions given to me. I know to contact the clinic, my physician, or go to the Emergency Department if any problems should occur. I do not have any questions at this time, but understand that I may call the clinic during office hours   should I have any questions or need assistance in obtaining follow up care.    __________________________________________  _____________  __________ Signature of Patient or Authorized Representative            Date                   Time    __________________________________________ Nurse's Signature    

## 2011-05-14 ENCOUNTER — Ambulatory Visit (HOSPITAL_BASED_OUTPATIENT_CLINIC_OR_DEPARTMENT_OTHER): Payer: Medicare Other

## 2011-05-14 VITALS — BP 94/66 | HR 65 | Temp 96.8°F

## 2011-05-14 DIAGNOSIS — C3490 Malignant neoplasm of unspecified part of unspecified bronchus or lung: Secondary | ICD-10-CM

## 2011-05-14 MED ORDER — PEGFILGRASTIM INJECTION 6 MG/0.6ML
6.0000 mg | Freq: Once | SUBCUTANEOUS | Status: AC
  Administered 2011-05-14: 6 mg via SUBCUTANEOUS

## 2011-06-01 ENCOUNTER — Ambulatory Visit: Payer: Medicare Other | Admitting: Oncology

## 2011-06-01 ENCOUNTER — Other Ambulatory Visit (HOSPITAL_BASED_OUTPATIENT_CLINIC_OR_DEPARTMENT_OTHER): Payer: Medicare Other | Admitting: Lab

## 2011-06-01 ENCOUNTER — Ambulatory Visit (HOSPITAL_BASED_OUTPATIENT_CLINIC_OR_DEPARTMENT_OTHER): Payer: Medicare Other

## 2011-06-01 ENCOUNTER — Ambulatory Visit (HOSPITAL_BASED_OUTPATIENT_CLINIC_OR_DEPARTMENT_OTHER): Payer: Medicare Other | Admitting: Oncology

## 2011-06-01 VITALS — BP 115/79 | HR 69 | Temp 97.1°F | Ht 69.0 in | Wt 154.5 lb

## 2011-06-01 DIAGNOSIS — C3490 Malignant neoplasm of unspecified part of unspecified bronchus or lung: Secondary | ICD-10-CM

## 2011-06-01 DIAGNOSIS — I4891 Unspecified atrial fibrillation: Secondary | ICD-10-CM

## 2011-06-01 DIAGNOSIS — Z5111 Encounter for antineoplastic chemotherapy: Secondary | ICD-10-CM

## 2011-06-01 DIAGNOSIS — R42 Dizziness and giddiness: Secondary | ICD-10-CM

## 2011-06-01 DIAGNOSIS — Z09 Encounter for follow-up examination after completed treatment for conditions other than malignant neoplasm: Secondary | ICD-10-CM

## 2011-06-01 LAB — CBC WITH DIFFERENTIAL/PLATELET
BASO%: 0.6 % (ref 0.0–2.0)
Basophils Absolute: 0 10*3/uL (ref 0.0–0.1)
EOS%: 5.1 % (ref 0.0–7.0)
Eosinophils Absolute: 0.4 10*3/uL (ref 0.0–0.5)
HCT: 32 % — ABNORMAL LOW (ref 38.4–49.9)
HGB: 11 g/dL — ABNORMAL LOW (ref 13.0–17.1)
LYMPH%: 20.8 % (ref 14.0–49.0)
MCH: 34 pg — ABNORMAL HIGH (ref 27.2–33.4)
MCHC: 34.4 g/dL (ref 32.0–36.0)
MCV: 98.8 fL — ABNORMAL HIGH (ref 79.3–98.0)
MONO#: 1.1 10*3/uL — ABNORMAL HIGH (ref 0.1–0.9)
MONO%: 15.8 % — ABNORMAL HIGH (ref 0.0–14.0)
NEUT#: 4.1 10*3/uL (ref 1.5–6.5)
NEUT%: 57.7 % (ref 39.0–75.0)
Platelets: 199 10*3/uL (ref 140–400)
RBC: 3.24 10*6/uL — ABNORMAL LOW (ref 4.20–5.82)
RDW: 16 % — ABNORMAL HIGH (ref 11.0–14.6)
WBC: 7.1 10*3/uL (ref 4.0–10.3)
lymph#: 1.5 10*3/uL (ref 0.9–3.3)
nRBC: 0 % (ref 0–0)

## 2011-06-01 LAB — COMPREHENSIVE METABOLIC PANEL
ALT: 19 U/L (ref 0–53)
AST: 22 U/L (ref 0–37)
Albumin: 4.2 g/dL (ref 3.5–5.2)
Alkaline Phosphatase: 89 U/L (ref 39–117)
BUN: 14 mg/dL (ref 6–23)
CO2: 24 mEq/L (ref 19–32)
Calcium: 9.1 mg/dL (ref 8.4–10.5)
Chloride: 105 mEq/L (ref 96–112)
Creatinine, Ser: 0.97 mg/dL (ref 0.50–1.35)
Glucose, Bld: 107 mg/dL — ABNORMAL HIGH (ref 70–99)
Potassium: 3.9 mEq/L (ref 3.5–5.3)
Sodium: 138 mEq/L (ref 135–145)
Total Bilirubin: 0.2 mg/dL — ABNORMAL LOW (ref 0.3–1.2)
Total Protein: 7.1 g/dL (ref 6.0–8.3)

## 2011-06-01 MED ORDER — SODIUM CHLORIDE 0.9 % IV SOLN
325.8750 mg | Freq: Once | INTRAVENOUS | Status: AC
  Administered 2011-06-01: 330 mg via INTRAVENOUS
  Filled 2011-06-01: qty 33

## 2011-06-01 MED ORDER — ONDANSETRON 16 MG/50ML IVPB (CHCC)
16.0000 mg | Freq: Once | INTRAVENOUS | Status: AC
  Administered 2011-06-01: 16 mg via INTRAVENOUS

## 2011-06-01 MED ORDER — SODIUM CHLORIDE 0.9 % IV SOLN
75.0000 mg/m2 | Freq: Once | INTRAVENOUS | Status: AC
  Administered 2011-06-01: 140 mg via INTRAVENOUS
  Filled 2011-06-01: qty 7

## 2011-06-01 MED ORDER — DEXAMETHASONE SODIUM PHOSPHATE 4 MG/ML IJ SOLN
20.0000 mg | Freq: Once | INTRAMUSCULAR | Status: AC
  Administered 2011-06-01: 20 mg via INTRAVENOUS

## 2011-06-01 NOTE — Progress Notes (Signed)
[facility] OFFICE PROGRESS NOTE  Gerald Hoff, MD, MD  DIAGNOSIS: extensive stage small cell lung cancer; diagnosed in Oct 2012. Extensive stage due to extensive involvement of lung field, not candidate for concurrent radiation. No evidence of distant met.   CURRENT THERAPY: started Carboplatin/Etoposide 02/09/2011 on days 1,2, and 3 of q3wk cycle; with Neulasta on day 4 to decrease risk of neutropenic infection. Starting cycle #2, chemo dose was decreased by 25% due to worsening performance status and poor toleration of cycle #1. He is for his last cycle #6 today  INTERVAL HISTORY:  Gerald [name] 71 y.o. male returns for regular follow up with a daughter. He reports doing very well. He tolerates chemo well. However, on day 5 -6 of each chemo cycle, he thinks that the Neulasta injection causes fatigue, nausea/vomiting and abdominal pain. These symptoms resolve quickly. He denies fever, headache, vertigo, SOB, CP, abdominal pain, bleeding symptoms, skin rash, bone/muscle pain. He has gained 2 lbs since last visit, taking Ensure 3 times a day and eating small bites of food such as soup, rice, meat , porchops.  He exercises everyday with walking around his house for about 15-20 minutes without problem. His cycle 5 was on 2/13-2/15 with Neulasta on 2/16. He is eager to proceed with last chemo cycle.  MEDICAL HISTORY: Past Medical History  Diagnosis Date  . A-fib   . Hyperlipidemia   . Small cell lung cancer 12/2010  . Smoking   . CAD (coronary artery disease)     last cath 2008 with noncritical CAD  . Allergy     seasonal allergies  . GERD (gastroesophageal reflux disease)     SURGICAL HISTORY:  Past Surgical History  Procedure Date  . Catherization 2008    cardiac catherization    MEDICATIONS: Current Outpatient Prescriptions  Medication Sig Dispense Refill  . aspirin 81 MG tablet Take 81 mg by mouth daily.      . digoxin (LANOXIN) 0.25 MG tablet Take 250 mcg by mouth  daily. 1/2 tablet daily      . metoprolol tartrate (LOPRESSOR) 25 MG tablet Take 25 mg by mouth 2 (two) times daily. 1 1/2 tab twice daily      . ondansetron (ZOFRAN) 8 MG tablet Take by mouth every 12 (twelve) hours as needed.       . Rivaroxaban (XARELTO) 20 MG TABS Take 20 mg by mouth daily.       . simvastatin (ZOCOR) 40 MG tablet Take 40 mg by mouth at bedtime.          ALLERGIES:   has no known allergies.  REVIEW OF SYSTEMS:  The rest of the 14-point review of system was negative.   There were no vitals filed for this visit. Wt Readings from Last 3 Encounters:  05/11/11 152 lb 4.8 oz (69.083 kg)  04/20/11 152 lb 1.6 oz (68.992 kg)  04/13/11 152 lb (68.947 kg)   ECOG Performance status:   PHYSICAL EXAMINATION:   General: thin-appearing man in no acute distress. Eyes: no scleral icterus. ENT: There were no oropharyngeal lesions. Neck was without thyromegaly. Lymphatics: Negative cervical, supraclavicular or axillary adenopathy. Respiratory: lungs were clear bilaterally without wheezing or crackles. Cardiovascular: Regular rate and rhythm, S1/S2, without murmur, rub or gallop. There was no pedal edema. GI: abdomen was soft, flat, nontender, nondistended, without organomegaly. Muscoloskeletal: no spinal tenderness of palpation of vertebral spine. Skin exam was without echymosis, petichae. Neuro exam was nonfocal with strength 5/5; normal tendon reflex.  Patient was able to get on and off exam table without assistance. Gait was normal. Patient was alerted and oriented. Attention was good. Language was appropriate. Mood was normal without depression. Speech was not pressured. Thought content was not tangential.        LABORATORY/RADIOLOGY DATA:   Lab 06/01/11 0954  WBC 7.1  HGB 11.0*  HCT 32.0*  PLT 199  MCV 98.8*  MCH 34.0*  MCHC 34.4  RDW 16.0*  LYMPHSABS 1.5  MONOABS 1.1*  EOSABS 0.4  BASOSABS 0.0  BANDABS --    CMP   No results found for this basename:  NA:5,K:5,CL:5,CO2:5,GLUCOSE:5,BUN:5,CREATININE:5,GFRCGP,:5,CALCIUM:5,MG:5,AST:5,ALT:5,ALKPHOS:5,BILITOT:5 in the last 168 hours      Component Value Date/Time   BILITOT 0.3 05/11/2011 0815     Radiology Studies:  No results found.     ASSESSMENT AND PLAN:   1. Small cell lung cancer: He is status post 5 cycles of chemotherapy, to proceed with 6th and last treatment today.. After the 1st cycle, he had poor performanace status, thus, chemo dose was reduced by 25%. Since then, he has been able to tolerate it well. There was sign of tumor shrinkage after 1 cycle on CXR. Thus, mid treatment CT scan were deferred until after the last cycle of therapy . Will schedule CT Head, Chest, Abdomen and Pelvis with contrast on May 25 and he is to follow up with Dr. Gaylyn [name] on May 26. After review of the scans, Dr. Gaylyn [name] is to evaluate the necessity of radiation to the brain.    2. 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 His symptoms now have resolved.   3. Afib: He is on rate control with his PCP and cardiologist. He is on Xarelto when he had Afib with RVR. Will rediscuss the use of the med if his Plt is <80K from chemo.   4. History of smoking: he no longer smokes cigarettes.   5. Code status: FULL CODE.   6. Weight loss: His weight has now improved. Will continue to monitor.   7. Follow up: Follow with Dr. Gaylyn [name] on May 26 with staging CTs on May 25 as mentioned above. He knows to call us in the interim if he has any questions or concerns.

## 2011-06-01 NOTE — Patient Instructions (Signed)
[facility] Discharge Instructions for Patients Receiving Chemotherapy  Today you received the following chemotherapy agents Carboplatin and Etoposide.  To help prevent nausea and vomiting after your treatment, we encourage you to take your nausea medication Zofran. Begin taking it at bedtime and take it as often as prescribed for the next 72 hours.   If you develop nausea and vomiting that is not controlled by your nausea medication, call the clinic. If it is after clinic hours your family physician or the after hours number for the clinic or go to the Emergency Department.   BELOW ARE SYMPTOMS THAT SHOULD BE REPORTED IMMEDIATELY:  *FEVER GREATER THAN 100.5 F  *CHILLS WITH OR WITHOUT FEVER  NAUSEA AND VOMITING THAT IS NOT CONTROLLED WITH YOUR NAUSEA MEDICATION  *UNUSUAL SHORTNESS OF BREATH  *UNUSUAL BRUISING OR BLEEDING  TENDERNESS IN MOUTH AND THROAT WITH OR WITHOUT PRESENCE OF ULCERS  *URINARY PROBLEMS  *BOWEL PROBLEMS  UNUSUAL RASH Items with * indicate a potential emergency and should be followed up as soon as possible.  Feel free to call the clinic you have any questions or concerns. The clinic phone number is [phone].   I have been informed and understand all the instructions given to me. I know to contact the clinic, my physician, or go to the Emergency Department if any problems should occur. I do not have any questions at this time, but understand that I may call the clinic during office hours   should I have any questions or need assistance in obtaining follow up care.    __________________________________________  _____________  __________ Signature of Patient or Authorized Representative            Date                   Time    __________________________________________ Nurse's Signature

## 2011-06-02 ENCOUNTER — Ambulatory Visit (HOSPITAL_BASED_OUTPATIENT_CLINIC_OR_DEPARTMENT_OTHER): Payer: Medicare Other

## 2011-06-02 VITALS — BP 113/66 | HR 70 | Temp 97.6°F

## 2011-06-02 DIAGNOSIS — Z5111 Encounter for antineoplastic chemotherapy: Secondary | ICD-10-CM

## 2011-06-02 DIAGNOSIS — C3490 Malignant neoplasm of unspecified part of unspecified bronchus or lung: Secondary | ICD-10-CM

## 2011-06-02 MED ORDER — SODIUM CHLORIDE 0.9 % IV SOLN
Freq: Once | INTRAVENOUS | Status: AC
  Administered 2011-06-02: 13:00:00 via INTRAVENOUS

## 2011-06-02 MED ORDER — SODIUM CHLORIDE 0.9 % IV SOLN
75.0000 mg/m2 | Freq: Once | INTRAVENOUS | Status: AC
  Administered 2011-06-02: 140 mg via INTRAVENOUS
  Filled 2011-06-02: qty 7

## 2011-06-02 MED ORDER — PROCHLORPERAZINE MALEATE 10 MG PO TABS
10.0000 mg | ORAL_TABLET | Freq: Once | ORAL | Status: AC
  Administered 2011-06-02: 10 mg via ORAL

## 2011-06-03 ENCOUNTER — Ambulatory Visit (HOSPITAL_BASED_OUTPATIENT_CLINIC_OR_DEPARTMENT_OTHER): Payer: Medicare Other

## 2011-06-03 VITALS — BP 100/64 | HR 66 | Temp 97.2°F

## 2011-06-03 DIAGNOSIS — Z5111 Encounter for antineoplastic chemotherapy: Secondary | ICD-10-CM

## 2011-06-03 DIAGNOSIS — C3490 Malignant neoplasm of unspecified part of unspecified bronchus or lung: Secondary | ICD-10-CM

## 2011-06-03 MED ORDER — SODIUM CHLORIDE 0.9 % IV SOLN
75.0000 mg/m2 | Freq: Once | INTRAVENOUS | Status: AC
  Administered 2011-06-03: 140 mg via INTRAVENOUS
  Filled 2011-06-03: qty 7

## 2011-06-03 MED ORDER — PROCHLORPERAZINE MALEATE 10 MG PO TABS
10.0000 mg | ORAL_TABLET | Freq: Once | ORAL | Status: AC
  Administered 2011-06-03: 10 mg via ORAL

## 2011-06-03 MED ORDER — SODIUM CHLORIDE 0.9 % IV SOLN: Freq: Once | INTRAVENOUS | Status: DC

## 2011-06-04 ENCOUNTER — Ambulatory Visit (HOSPITAL_BASED_OUTPATIENT_CLINIC_OR_DEPARTMENT_OTHER): Payer: Medicare Other

## 2011-06-04 VITALS — BP 99/65 | HR 72 | Temp 98.2°F

## 2011-06-04 DIAGNOSIS — C3490 Malignant neoplasm of unspecified part of unspecified bronchus or lung: Secondary | ICD-10-CM

## 2011-06-04 MED ORDER — PEGFILGRASTIM INJECTION 6 MG/0.6ML
6.0000 mg | Freq: Once | SUBCUTANEOUS | Status: AC
  Administered 2011-06-04: 6 mg via SUBCUTANEOUS

## 2011-06-20 ENCOUNTER — Ambulatory Visit (HOSPITAL_COMMUNITY)
Admission: RE | Admit: 2011-06-20 | Discharge: 2011-06-20 | Disposition: A | Discharge status: Home / Self Care | Payer: Medicare Other | Source: Ambulatory Visit | Attending: Oncology | Admitting: Oncology

## 2011-06-20 ENCOUNTER — Other Ambulatory Visit (HOSPITAL_BASED_OUTPATIENT_CLINIC_OR_DEPARTMENT_OTHER): Payer: Medicare Other | Admitting: Lab

## 2011-06-20 ENCOUNTER — Other Ambulatory Visit: Payer: Self-pay | Admitting: Oncology

## 2011-06-20 DIAGNOSIS — C3490 Malignant neoplasm of unspecified part of unspecified bronchus or lung: Secondary | ICD-10-CM

## 2011-06-20 DIAGNOSIS — R599 Enlarged lymph nodes, unspecified: Secondary | ICD-10-CM | POA: Insufficient documentation

## 2011-06-20 LAB — CBC WITH DIFFERENTIAL/PLATELET
BASO%: 0.4 % (ref 0.0–2.0)
Basophils Absolute: 0 10*3/uL (ref 0.0–0.1)
EOS%: 4.8 % (ref 0.0–7.0)
Eosinophils Absolute: 0.3 10*3/uL (ref 0.0–0.5)
HCT: 34 % — ABNORMAL LOW (ref 38.4–49.9)
HGB: 11.6 g/dL — ABNORMAL LOW (ref 13.0–17.1)
LYMPH%: 27.4 % (ref 14.0–49.0)
MCH: 35.3 pg — ABNORMAL HIGH (ref 27.2–33.4)
MCHC: 34.1 g/dL (ref 32.0–36.0)
MCV: 103.5 fL — ABNORMAL HIGH (ref 79.3–98.0)
MONO#: 0.9 10*3/uL (ref 0.1–0.9)
MONO%: 13.8 % (ref 0.0–14.0)
NEUT#: 3.3 10*3/uL (ref 1.5–6.5)
NEUT%: 53.6 % (ref 39.0–75.0)
Platelets: 190 10*3/uL (ref 140–400)
RBC: 3.28 10*6/uL — ABNORMAL LOW (ref 4.20–5.82)
RDW: 17 % — ABNORMAL HIGH (ref 11.0–14.6)
WBC: 6.3 10*3/uL (ref 4.0–10.3)
lymph#: 1.7 10*3/uL (ref 0.9–3.3)

## 2011-06-20 LAB — CMP (CANCER CENTER ONLY)
ALT(SGPT): 28 U/L (ref 10–47)
AST: 25 U/L (ref 11–38)
Albumin: 3.5 g/dL (ref 3.3–5.5)
Alkaline Phosphatase: 95 U/L — ABNORMAL HIGH (ref 26–84)
BUN, Bld: 16 mg/dL (ref 7–22)
CO2: 29 mEq/L (ref 18–33)
Calcium: 8.4 mg/dL (ref 8.0–10.3)
Chloride: 102 mEq/L (ref 98–108)
Creat: 1 mg/dl (ref 0.6–1.2)
Glucose, Bld: 99 mg/dL (ref 73–118)
Potassium: 3.9 mEq/L (ref 3.3–4.7)
Sodium: 142 mEq/L (ref 128–145)
Total Bilirubin: 0.5 mg/dl (ref 0.20–1.60)
Total Protein: 7.7 g/dL (ref 6.4–8.1)

## 2011-06-20 MED ORDER — IOHEXOL 300 MG/ML  SOLN
100.0000 mL | Freq: Once | INTRAMUSCULAR | Status: AC | PRN
  Administered 2011-06-20: 100 mL via INTRAVENOUS

## 2011-06-21 ENCOUNTER — Ambulatory Visit (HOSPITAL_BASED_OUTPATIENT_CLINIC_OR_DEPARTMENT_OTHER): Payer: Medicare Other | Admitting: Oncology

## 2011-06-21 ENCOUNTER — Other Ambulatory Visit (HOSPITAL_COMMUNITY): Payer: Medicare Other

## 2011-06-21 ENCOUNTER — Telehealth: Payer: Self-pay | Admitting: Oncology

## 2011-06-21 VITALS — BP 105/68 | HR 68 | Temp 97.8°F | Wt 153.4 lb

## 2011-06-21 DIAGNOSIS — C3490 Malignant neoplasm of unspecified part of unspecified bronchus or lung: Secondary | ICD-10-CM

## 2011-06-21 NOTE — Telephone Encounter (Signed)
G pt appts for [id number].  scheduled appt with Dr. Michell Heinrich for 04/10.  scheduled ct scan on 09/23 @ [facility]

## 2011-06-21 NOTE — Progress Notes (Signed)
[facility] OFFICE PROGRESS NOTE  Gerald Hoff, MD, MD [address] [place] [address]  DIAGNOSIS: extensive stage small cell lung cancer; diagnosed in Oct 2012.    Extensive stage due to extensive involvement of lung field, not candidate for concurrent radiation.   No evidence of distant met.   PAST THERAPY:  S/p 6 cycles of Carboplatin/Etoposide between 02/09/2011 and 06/04/2011.   CURRENT THERAPY:  Watchful observation.   Pending evaluation for prophylactic intracranial irradiation.   INTERVAL HISTORY: Gerald [name] 71 y.o. male returns for regular follow up with a daughter.  He reported that he is doing very well.  He ambulates around the house few times a day.  He has much less dizziness compared to before he started on chemo.  He has good appetite and stable weight.  He is independent of activities of daily living.  He denies fatigue, headache, visual changes, confusion, drenching night sweats, palpable lymph node swelling, mucositis, odynophagia, dysphagia, nausea vomiting, jaundice, chest pain, palpitation, shortness of breath, dyspnea on exertion, productive cough, gum bleeding, epistaxis, hematemesis, hemoptysis, abdominal pain, abdominal swelling, early satiety, melena, hematochezia, hematuria, skin rash, spontaneous bleeding, joint swelling, joint pain, heat or cold intolerance, bowel bladder incontinence, back pain, focal motor weakness, paresthesia, depression, suicidal or homocidal ideation, feeling hopelessness.     MEDICAL HISTORY: Past Medical History  Diagnosis Date  . A-fib   . Hyperlipidemia   . Small cell lung cancer 12/2010  . Smoking   . CAD (coronary artery disease)     last cath 2008 with noncritical CAD  . Allergy     seasonal allergies  . GERD (gastroesophageal reflux disease)     ALLERGIES:   has no known allergies.  MEDICATIONS: Current Outpatient Prescriptions  Medication Sig Dispense Refill  . aspirin 81 MG tablet Take 81 mg by  mouth daily.      . digoxin (LANOXIN) 0.25 MG tablet Take 250 mcg by mouth daily. 1/2 tablet daily      . meclizine (ANTIVERT) 12.5 MG tablet Take 12.5 mg by mouth every 6 (six) hours as needed.      . metoprolol tartrate (LOPRESSOR) 25 MG tablet Take 25 mg by mouth 2 (two) times daily. 1 1/2 tab twice daily      . ondansetron (ZOFRAN) 8 MG tablet Take by mouth every 12 (twelve) hours as needed.       . Rivaroxaban (XARELTO) 20 MG TABS Take 20 mg by mouth daily.       . simvastatin (ZOCOR) 40 MG tablet Take 40 mg by mouth at bedtime.         No current facility-administered medications for this visit.   Facility-Administered Medications Ordered in Other Visits  Medication Dose Route Frequency Provider Last Rate Last Dose  . iohexol (OMNIPAQUE) 300 MG/ML solution 100 mL  100 mL Intravenous Once PRN Medication Radiologist, MD   100 mL at 06/20/11 1302    SURGICAL HISTORY:  Past Surgical History  Procedure Date  . Catherization 2008    cardiac catherization    REVIEW OF SYSTEMS:  Pertinent items are noted in HPI.   Filed Vitals:   06/21/11 0938  BP: 105/68  Pulse: 68  Temp: 97.8 F (36.6 C)   Wt Readings from Last 3 Encounters:  06/21/11 153 lb 6.4 oz (69.582 kg)  06/01/11 154 lb 8 oz (70.081 kg)  05/11/11 152 lb 4.8 oz (69.083 kg)   ECOG 1  PHYSICAL EXAMINATION:  General: thin-appearing man  in no acute distress. Eyes: no scleral icterus. ENT: There were no oropharyngeal lesions. Neck was without thyromegaly. Lymphatics: Negative cervical, supraclavicular or axillary adenopathy. Respiratory: lungs were clear bilaterally without wheezing or crackles. Cardiovascular: Regular rate and rhythm, S1/S2, without murmur, rub or gallop. There was no pedal edema. GI: abdomen was soft, flat, nontender, nondistended, without organomegaly. Muscoloskeletal: no spinal tenderness of palpation of vertebral spine. Skin exam was without echymosis, petichae. Neuro exam was nonfocal with strength 5/5;  normal tendon reflex. Patient was able to get on and off exam table without assistance. Gait was normal. Patient was alerted and oriented. Attention was good. Language was appropriate. Mood was normal without depression. Speech was not pressured. Thought content was not tangential.   LABORATORY/RADIOLOGY DATA:  Lab Results  Component Value Date   WBC 6.3 06/20/2011   HGB 11.6* 06/20/2011   HCT 34.0* 06/20/2011   PLT 190 06/20/2011   GLUCOSE 99 06/20/2011   CHOL  Value: 121        ATP III CLASSIFICATION:  <200     mg/dL   Desirable  161-096  mg/dL   Borderline High  >=045    mg/dL   High        07/04/8117   TRIG 29 09/11/2009   HDL 41 09/11/2009   LDLCALC  Value: 74        Total Cholesterol/HDL:CHD Risk Coronary Heart Disease Risk Table                     Men   Women  1/2 Average Risk   3.4   3.3  Average Risk       5.0   4.4  2 X Average Risk   9.6   7.1  3 X Average Risk  23.4   11.0        Use the calculated Patient Ratio above and the CHD Risk Table to determine the patient's CHD Risk.        ATP III CLASSIFICATION (LDL):  <100     mg/dL   Optimal  147-829  mg/dL   Near or Above                    Optimal  130-159  mg/dL   Borderline  562-130  mg/dL   High  >865     mg/dL   Very High 7/84/6962   ALT 19 06/01/2011   AST 25 06/20/2011   NA 142 06/20/2011   K 3.9 06/20/2011   CL 102 06/20/2011   CREATININE 1.0 06/20/2011   BUN 16 06/20/2011   CO2 29 06/20/2011   TSH 2.201 01/23/2011   INR 0.99 01/26/2011   HGBA1C 5.8* 01/23/2011   IMAGING:  06/20/2011:   I personally reviewed the following CT's and showed the patient and his daughter the images:  In brief, he has very good response to the small cell lung cancer.  There was no evidence of metastatic disease.   CT HEAD WITHOUT AND WITH CONTRAST  Technique: Contiguous axial images were obtained from the base of  the skull through the vertex without and with intravenous contrast.  Contrast: 100 ml Omnipaque-300 IV  Comparison: MRI 02/04/2011    Findings: Ventricle size is normal. Negative for hemorrhage.  Negative for infarct or mass.  Postcontrast imaging reveals normal enhancement. No enhancing mass  lesion. Calvarium is intact.  IMPRESSION:  Negative for intracranial metastatic disease. No acute  abnormality.  Original Report Authenticated By: DAVID C.  Chestine Spore, M.D.  CT CHEST  Findings: There is marked reduction in the mediastinal  lymphadenopathy compared to prior. There are now small residual  lymph nodes which are not pathologic by CT size criteria. For  example, subcarinal lymph node measures 9 mm short axis compared to  44 mm on comparison PET CT scan. 14 mm right hilar lymph node is  noted. Right lower paratracheal lymph node measures 6 mm compared  to 31 mm on prior. Review of lung parenchyma likewise demonstrates  reduction of the pulmonary masses in the right middle lobe. For  example, small nodule with central calcification, measures 8 mm in  the right middle lobe (image 40) compared to 40 mm on prior. Nodule  along the medial aspect the right middle lobe adjacent to the  pleural surface of the mediastinum measures 7 mm compared to 37 mm  on prior. There is no new pulmonary nodules present. A calcified  nodule at the right lung apex measuring 12 mm is similar to 12 mm  on prior. This is adjacent to bolus change at the apex.  IMPRESSION:  1. Marked reduction in bulky mediastinal lymphadenopathy with  small partially calcified nodes remaining.  2. Marked reduction in of pulmonary masses in the right middle  lobe with small nodules remain.  CT ABDOMEN AND PELVIS  Findings: No focal hepatic lesion. The gallbladder, pancreas,  spleen, adrenal glands, and kidneys are normal.  The stomach, small bowel and colon are normal.  Abdominal aorta normal caliber. No retroperitoneal  lymphadenopathy. No periportal lymphadenopathy or retrocrural  adenopathy.  No free fluid the pelvis. The bladder and prostate gland are   normal. No pelvic lymphadenopathy. Review of bone windows  demonstrates no aggressive osseous lesions.  IMPRESSION:  1. No evidence metastasis in the abdomen or pelvis.  Original Report Authenticated By: Genevive Bi, M.D.   ASSESSMENT AND PLAN:   1. Small cell lung cancer:  He is status post 6 cycles of Carboplatin/Etoposide with very good response per restaging CT.  There is no evidence of metastatic disease.  As he has completed 6 cycles as per NCCN recommendation, I will stop further chemo at this time.  I discussed with him and his daughter the pros and cons of prophylactic intracranial XRT.  Randomized trials have shown improvement in survival and decreased risk of intracranial mets.  However, he may have side effects of fatigue, memory loss.  He and his daughter expressed informed understanding and would like to speak with Dr. Michell Heinrich further re: risk and benefits.  I referred him to Dr. Michell Heinrich to be seen within the next few weeks.   2. 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 His symptoms now have resolved.   3.  Afib:  He is on rate control with his PCP and cardiologist. He is on Xarelto for CVA prophylaxis.   4. History of smoking: he no longer smokes cigarettes.   5. Code status: FULL CODE.   6. Follow up:  Follow with Belenda Cruise, NP in about 3 months (no CT); and RV with me in about 6 months with restaging CT the day prior.

## 2011-06-21 NOTE — Progress Notes (Signed)
[facility] END OF TREATMENT   Name: Gerald [name] Date: 06/21/2011 MRN: [id number] DOB: 05-29-1940   TREATMENT DATES: between 02/09/2011 and 06/04/2011.    REFERRING PHYSICIAN: [name], MD   DIAGNOSIS:  Extensive stage small cell lung cancer.   STAGE AT START OF TREATMENT: Extensive stage    INTENT: palliative   DRUGS OR REGIMENS GIVEN:  Carboplatin/Etoposide   MAJOR TOXICITIES: grade 2 fatigue after first cycle.  Dose was reduced with improved performance status.    REASON TREATMENT STOPPED:  Finish of planned treatment.    PERFORMANCE STATUS AT END: ECOG 1   ONGOING PROBLEMS: none   FOLLOW UP PLANS:  Evaluation by Dr. Lurline Hare from Rad Onc for prophylactic intracranial radiation.

## 2011-07-05 NOTE — Progress Notes (Signed)
BEING SEEN FOR EXTENSIVE SMALL CELL LUNG CA WITH PROPHYLACTIC INTRACRANIAL RADIATION.    S/P 6 CYCLES OF CARBOPLATIN/ETOPSIDE BETWEEN 02/09/11 AND 06/04/11 WITH VERY GOOD RESPONSE, NO EVIDENCE OF METASTATIC DISEASE  ALLERGIES...Marland KitchenNKA

## 2011-07-06 ENCOUNTER — Encounter: Payer: Self-pay | Admitting: Radiation Oncology

## 2011-07-06 ENCOUNTER — Ambulatory Visit
Admission: RE | Admit: 2011-07-06 | Discharge: 2011-07-06 | Disposition: A | Discharge status: Home / Self Care | Payer: Medicare Other | Source: Ambulatory Visit | Attending: Radiation Oncology | Admitting: Radiation Oncology

## 2011-07-06 ENCOUNTER — Ambulatory Visit: Payer: Medicare Other

## 2011-07-06 ENCOUNTER — Ambulatory Visit: Admission: RE | Admit: 2011-07-06 | Payer: Medicare Other | Source: Ambulatory Visit | Admitting: Radiation Oncology

## 2011-07-06 VITALS — BP 102/68 | HR 62 | Temp 97.9°F | Resp 18 | Ht 68.0 in | Wt 153.4 lb

## 2011-07-06 DIAGNOSIS — Z79899 Other long term (current) drug therapy: Secondary | ICD-10-CM | POA: Insufficient documentation

## 2011-07-06 DIAGNOSIS — Z7982 Long term (current) use of aspirin: Secondary | ICD-10-CM | POA: Insufficient documentation

## 2011-07-06 DIAGNOSIS — I2510 Atherosclerotic heart disease of native coronary artery without angina pectoris: Secondary | ICD-10-CM | POA: Insufficient documentation

## 2011-07-06 DIAGNOSIS — I4891 Unspecified atrial fibrillation: Secondary | ICD-10-CM | POA: Insufficient documentation

## 2011-07-06 DIAGNOSIS — C3490 Malignant neoplasm of unspecified part of unspecified bronchus or lung: Secondary | ICD-10-CM

## 2011-07-06 DIAGNOSIS — E785 Hyperlipidemia, unspecified: Secondary | ICD-10-CM | POA: Insufficient documentation

## 2011-07-06 NOTE — Progress Notes (Signed)
Radiation Oncology         [phone] ________________________________  Name: Gerald [name] MRN: [id number]  Date: 07/06/2011  DOB: [birth year]  Follow-Up Visit Note  CC: [name], MD, MD  Exie Parody, MD  Diagnosis:   Extensive stage small cell lung cancer with imaging complete response  Narrative:  The patient returns today for routine follow-up.  He has finished his chemotherapy under the care of Dr. Irving Burton. He tolerated this very well. A recent CT of the chest abdomen and pelvis on 06/20/2011 showed reduction of his chest disease and no evidence of metastatic disease in the abdomen or pelvis. He was therefore referred back to me for consideration of prophylactic cranial radiation in the management of his disease. Accompanied by his son. I am not able to speak with him directly and his son translates for me. He has no headaches or vision changes.                              ALLERGIES:   has no allergies on file.  Meds: Current Outpatient Prescriptions  Medication Sig Dispense Refill  . aspirin 81 MG tablet Take 81 mg by mouth daily.      . digoxin (LANOXIN) 0.25 MG tablet Take 250 mcg by mouth daily. 1/2 tablet daily      . meclizine (ANTIVERT) 12.5 MG tablet Take 12.5 mg by mouth every 6 (six) hours as needed.      . metoprolol tartrate (LOPRESSOR) 25 MG tablet Take 25 mg by mouth 2 (two) times daily. 1 1/2 tab twice daily      . ondansetron (ZOFRAN) 8 MG tablet Take by mouth every 12 (twelve) hours as needed.       . Rivaroxaban (XARELTO) 20 MG TABS Take 20 mg by mouth daily.       . simvastatin (ZOCOR) 40 MG tablet Take 40 mg by mouth at bedtime.          Physical Findings: The patient is in no acute distress. Patient is alert and oriented.  height is 5\' 8"  (1.727 m) and weight is 153 lb 6.4 oz (69.582 kg). His oral temperature is 97.9 F (36.6 C). His blood pressure is 102/68 and his pulse is 62. His respiration is 18 and oxygen saturation is 96%. .  No significant  changes.  Lab Findings: Lab Results  Component Value Date   WBC 6.3 06/20/2011   HGB 11.6* 06/20/2011   HCT 34.0* 06/20/2011   MCV 103.5* 06/20/2011   PLT 190 06/20/2011    @LASTCHEM @  Radiographic Findings: Ct Head W Wo Contrast  06/20/2011  *RADIOLOGY REPORT*  Clinical Data: Small cell lung cancer  CT HEAD WITHOUT AND WITH CONTRAST  Technique:  Contiguous axial images were obtained from the base of the skull through the vertex without and with intravenous contrast.  Contrast:  100 ml Omnipaque-300 IV  Comparison: MRI 02/04/2011  Findings: Ventricle size is normal.  Negative for hemorrhage. Negative for infarct or mass.  Postcontrast imaging reveals normal enhancement.  No enhancing mass lesion.  Calvarium is intact.  IMPRESSION: Negative for intracranial metastatic disease.  No acute abnormality.  Original Report Authenticated By: Camelia Phenes, M.D.   Ct Chest W Contrast  06/20/2011  *RADIOLOGY REPORT*  Clinical Data:  Small cell lung cancer status post chemotherapy. Initial diagnosis of 12/2010.  Chemotherapy ongoing.  CT CHEST, ABDOMEN AND PELVIS WITH CONTRAST  Technique:  Multidetector CT  imaging of the chest, abdomen and pelvis was performed following the standard protocol during bolus administration of intravenous contrast.  Contrast:  100 ml Omnipaque 300  Comparison:  PET CT scan 02/10/2011  CT CHEST  Findings:  There is marked reduction in the mediastinal lymphadenopathy compared to prior.  There are now small residual lymph nodes which are not pathologic by CT size criteria.  For example, subcarinal lymph node measures 9 mm short axis compared to 44 mm on comparison PET CT scan.  14 mm right hilar lymph node is noted.   Right lower paratracheal lymph node measures 6 mm compared to 31 mm on prior.  Review of lung parenchyma likewise demonstrates reduction of the pulmonary masses in the right middle lobe.  For example, small nodule with central calcification, measures 8 mm in the right  middle lobe (image 40) compared to 40 mm on prior. Nodule along the medial aspect the right middle lobe adjacent to the pleural surface of the mediastinum  measures 7 mm compared to 37 mm on prior.  There is no new pulmonary nodules present.  A calcified nodule at the right lung apex measuring 12 mm is similar to 12 mm on prior. This is adjacent to bolus change at the apex.  IMPRESSION: 1.  Marked reduction in bulky mediastinal lymphadenopathy with small partially calcified  nodes remaining. 2.  Marked reduction in of pulmonary masses in the right middle lobe with small nodules remain.  CT ABDOMEN AND PELVIS  Findings:  No focal hepatic lesion.  The gallbladder, pancreas, spleen, adrenal glands, and kidneys are normal.  The stomach, small bowel and colon are normal.  Abdominal aorta normal caliber.  No retroperitoneal lymphadenopathy.  No periportal lymphadenopathy or retrocrural adenopathy.  No free fluid the pelvis.  The bladder and prostate gland are normal.  No pelvic lymphadenopathy. Review of  bone windows demonstrates no aggressive osseous lesions.  IMPRESSION:  1.  No evidence metastasis in the abdomen or pelvis.  Original Report Authenticated By: Genevive Bi, M.D.   Ct Abdomen Pelvis W Contrast  06/20/2011  *RADIOLOGY REPORT*  Clinical Data: Small cell lung cancer status post chemotherapy. Initial diagnosis of 12/2010. Chemotherapy ongoing.  CT CHEST, ABDOMEN AND PELVIS WITH CONTRAST  Technique: Multidetector CT imaging of the chest, abdomen and pelvis was performed following the standard protocol during bolus administration of intravenous contrast.  Contrast: 100 ml Omnipaque 300  Comparison: PET CT scan 02/10/2011  CT CHEST  Findings: There is marked reduction in the mediastinal lymphadenopathy compared to prior. There are now small residual lymph nodes which are not pathologic by CT size criteria. For example, subcarinal lymph node measures 9 mm short axis compared to 44 mm on comparison PET CT  scan. 14 mm right hilar lymph node is noted. Right lower paratracheal lymph node measures 6 mm compared to 31 mm on prior. Review of lung parenchyma likewise demonstrates reduction of the pulmonary masses in the right middle lobe. For example, small nodule with central calcification, measures 8 mm in the right middle lobe (image 40) compared to 40 mm on prior. Nodule along the medial aspect the right middle lobe adjacent to the pleural surface of the mediastinum measures 7 mm compared to 37 mm on prior. There is no new pulmonary nodules present. A calcified nodule at the right lung apex measuring 12 mm is similar to 12 mm on prior. This is adjacent to bolus change at the apex.  IMPRESSION: 1. Marked reduction in bulky mediastinal lymphadenopathy with  small partially calcified nodes remaining. 2. Marked reduction in of pulmonary masses in the right middle lobe with small nodules remain.  CT ABDOMEN AND PELVIS  Findings: No focal hepatic lesion. The gallbladder, pancreas, spleen, adrenal glands, and kidneys are normal.  The stomach, small bowel and colon are normal.  Abdominal aorta normal caliber. No retroperitoneal lymphadenopathy. No periportal lymphadenopathy or retrocrural adenopathy.  No free fluid the pelvis. The bladder and prostate gland are normal. No pelvic lymphadenopathy. Review of bone windows demonstrates no aggressive osseous lesions.  IMPRESSION:  1. No evidence metastasis in the abdomen or pelvis.  Original Report Authenticated By: Genevive Bi, M.D.    Impression:  Extensive stage small cell lung cancer with a imaging complete response  Plan:  I spoke with the patient and his son today regarding the role of prophylactic cranial radiation in patients who receive a complete response. We discussed the survival benefit seen in randomized trials. He discussed the process of simulation the making a mask and the need for daily treatments. We discussed 12 treatments as an outpatient. We discussed  the possible side effects of treatment including but not limited to hair loss fatigue and short-term memory loss. We discussed that this short-term memory loss can sometimes be more apparent in elderly patients who likely have underlying cerebrovascular disease. We discussed the other option was to wait to see if and when he developed brain metastases. The downside of waiting is that he may be symptomatic at that point and have difficulties with speech or movement. The downside would also be you have require more treatments and have a greater likelihood of side effects after that treatment. His son seem to understand that the subtleties of our discussion. His father would like to go home and discuss it further with his family members before making a decision. I gave them the number for her simulation room to schedule a planning session if you would like to proceed forward.

## 2011-08-25 NOTE — Progress Notes (Signed)
Encounter addended by: Delynn Flavin, RN on: 08/25/2011 11:18 AM<BR>     Documentation filed: Charges VN

## 2011-09-06 NOTE — Progress Notes (Signed)
Encounter addended by: Tessa Lerner, RN on: 09/06/2011 10:09 AM<BR>     Documentation filed: Charges VN

## 2011-09-12 ENCOUNTER — Encounter: Payer: Self-pay | Admitting: Oncology

## 2011-09-12 ENCOUNTER — Ambulatory Visit (HOSPITAL_BASED_OUTPATIENT_CLINIC_OR_DEPARTMENT_OTHER): Payer: Medicare Other | Admitting: Oncology

## 2011-09-12 ENCOUNTER — Other Ambulatory Visit (HOSPITAL_BASED_OUTPATIENT_CLINIC_OR_DEPARTMENT_OTHER): Payer: Medicare Other | Admitting: Lab

## 2011-09-12 VITALS — BP 128/86 | HR 64 | Temp 97.0°F | Ht 68.0 in | Wt 155.9 lb

## 2011-09-12 DIAGNOSIS — C3490 Malignant neoplasm of unspecified part of unspecified bronchus or lung: Secondary | ICD-10-CM

## 2011-09-12 DIAGNOSIS — C342 Malignant neoplasm of middle lobe, bronchus or lung: Secondary | ICD-10-CM

## 2011-09-12 LAB — CBC WITH DIFFERENTIAL/PLATELET
BASO%: 0.6 % (ref 0.0–2.0)
Basophils Absolute: 0 10*3/uL (ref 0.0–0.1)
EOS%: 9.7 % — ABNORMAL HIGH (ref 0.0–7.0)
Eosinophils Absolute: 0.5 10*3/uL (ref 0.0–0.5)
HCT: 36 % — ABNORMAL LOW (ref 38.4–49.9)
HGB: 12.2 g/dL — ABNORMAL LOW (ref 13.0–17.1)
LYMPH%: 35.5 % (ref 14.0–49.0)
MCH: 32.6 pg (ref 27.2–33.4)
MCHC: 33.9 g/dL (ref 32.0–36.0)
MCV: 96 fL (ref 79.3–98.0)
MONO#: 0.5 10*3/uL (ref 0.1–0.9)
MONO%: 11.3 % (ref 0.0–14.0)
NEUT#: 2.1 10*3/uL (ref 1.5–6.5)
NEUT%: 42.9 % (ref 39.0–75.0)
Platelets: 136 10*3/uL — ABNORMAL LOW (ref 140–400)
RBC: 3.75 10*6/uL — ABNORMAL LOW (ref 4.20–5.82)
RDW: 14.1 % (ref 11.0–14.6)
WBC: 4.8 10*3/uL (ref 4.0–10.3)
lymph#: 1.7 10*3/uL (ref 0.9–3.3)

## 2011-09-12 LAB — COMPREHENSIVE METABOLIC PANEL
ALT: 28 U/L (ref 0–53)
AST: 25 U/L (ref 0–37)
Albumin: 3.8 g/dL (ref 3.5–5.2)
Alkaline Phosphatase: 67 U/L (ref 39–117)
BUN: 20 mg/dL (ref 6–23)
CO2: 26 mEq/L (ref 19–32)
Calcium: 8.8 mg/dL (ref 8.4–10.5)
Chloride: 107 mEq/L (ref 96–112)
Creatinine, Ser: 1.18 mg/dL (ref 0.50–1.35)
Glucose, Bld: 100 mg/dL — ABNORMAL HIGH (ref 70–99)
Potassium: 3.7 mEq/L (ref 3.5–5.3)
Sodium: 139 mEq/L (ref 135–145)
Total Bilirubin: 0.3 mg/dL (ref 0.3–1.2)
Total Protein: 6.6 g/dL (ref 6.0–8.3)

## 2011-09-12 NOTE — Progress Notes (Signed)
[facility] OFFICE PROGRESS NOTE  [name], MD [address] [place] [address]  DIAGNOSIS: extensive stage small cell lung cancer; diagnosed in Oct 2012.    Extensive stage due to extensive involvement of lung field, not candidate for concurrent radiation.   No evidence of distant met.   PAST THERAPY:  S/p 6 cycles of Carboplatin/Etoposide between 02/09/2011 and 06/04/2011.   CURRENT THERAPY:  Watchful observation.     INTERVAL HISTORY: Gerald [name] 71 y.o. male returns for regular follow up with his son. The patient was seen by Dr Gerald [name] for consideration for prophylactic intracranial irradiation. The patient declined to receive the radiation because he was concerned about side effects.  He reported that he is doing very well.  He ambulates around the house few times a day.  He has much less dizziness compared to before he started on chemo. He has good appetite and stable weight.  He is independent of activities of daily living. Able to mow the lawn without difficulty. He denies fatigue, headache, visual changes, confusion, drenching night sweats, palpable lymph node swelling, mucositis, odynophagia, dysphagia, nausea vomiting, jaundice, chest pain, palpitation, shortness of breath, dyspnea on exertion, productive cough, gum bleeding, epistaxis, hematemesis, hemoptysis, abdominal pain, abdominal swelling, early satiety, melena, hematochezia, hematuria, skin rash, spontaneous bleeding, joint swelling, joint pain, heat or cold intolerance, bowel bladder incontinence, back pain, focal motor weakness, paresthesia, depression, suicidal or homocidal ideation, feeling hopelessness.  MEDICAL HISTORY: Past Medical History  Diagnosis Date  . A-fib   . Hyperlipidemia   . Small cell lung cancer 12/2010  . Smoking   . CAD (coronary artery disease)     last cath 2008 with noncritical CAD  . Allergy     seasonal allergies  . GERD (gastroesophageal reflux disease)      ALLERGIES:   has no allergies on file.  MEDICATIONS: Current Outpatient Prescriptions  Medication Sig Dispense Refill  . aspirin 81 MG tablet Take 81 mg by mouth daily.      . digoxin (LANOXIN) 0.25 MG tablet Take 250 mcg by mouth daily. 1/2 tablet daily      . meclizine (ANTIVERT) 12.5 MG tablet Take 12.5 mg by mouth every 6 (six) hours as needed.      . metoprolol tartrate (LOPRESSOR) 25 MG tablet Take 25 mg by mouth 2 (two) times daily. 1 1/2 tab twice daily      . ondansetron (ZOFRAN) 8 MG tablet Take by mouth every 12 (twelve) hours as needed.       . Rivaroxaban (XARELTO) 20 MG TABS Take 20 mg by mouth daily.       . simvastatin (ZOCOR) 40 MG tablet Take 40 mg by mouth at bedtime.          SURGICAL HISTORY:  Past Surgical History  Procedure Date  . Catherization 2008    cardiac catherization    REVIEW OF SYSTEMS:  Pertinent items are noted in HPI.   Filed Vitals:   09/12/11 1031  BP: 128/86  Pulse: 64  Temp: 97 F (36.1 C)   Wt Readings from Last 3 Encounters:  09/12/11 155 lb 14.4 oz (70.716 kg)  07/06/11 153 lb 6.4 oz (69.582 kg)  06/21/11 153 lb 6.4 oz (69.582 kg)   ECOG 1  PHYSICAL EXAMINATION:  General: thin-appearing man in no acute distress. Eyes: no scleral icterus. ENT: There were no oropharyngeal lesions. Neck was without thyromegaly. Lymphatics: Negative cervical, supraclavicular or axillary adenopathy. Respiratory: lungs were clear  bilaterally without wheezing or crackles. Cardiovascular: Regular rate and rhythm, S1/S2, without murmur, rub or gallop. There was no pedal edema. GI: abdomen was soft, flat, nontender, nondistended, without organomegaly. Muscoloskeletal: no spinal tenderness of palpation of vertebral spine. Skin exam was without echymosis, petichae. Neuro exam was nonfocal with strength 5/5; normal tendon reflex. Patient was able to get on and off exam table without assistance. Gait was normal. Patient was alerted and oriented. Attention was  good. Language was appropriate. Mood was normal without depression. Speech was not pressured. Thought content was not tangential.   LABORATORY/RADIOLOGY DATA:  Lab Results  Component Value Date   WBC 4.8 09/12/2011   HGB 12.2* 09/12/2011   HCT 36.0* 09/12/2011   PLT 136* 09/12/2011   GLUCOSE 99 06/20/2011   CHOL  Value: 121        ATP III CLASSIFICATION:  <200     mg/dL   Desirable  147-829  mg/dL   Borderline High  >=562    mg/dL   High        04/27/8655   TRIG 29 09/11/2009   HDL 41 09/11/2009   LDLCALC  Value: 74        Total Cholesterol/HDL:CHD Risk Coronary Heart Disease Risk Table                     Men   Women  1/2 Average Risk   3.4   3.3  Average Risk       5.0   4.4  2 X Average Risk   9.6   7.1  3 X Average Risk  23.4   11.0        Use the calculated Patient Ratio above and the CHD Risk Table to determine the patient's CHD Risk.        ATP III CLASSIFICATION (LDL):  <100     mg/dL   Optimal  846-962  mg/dL   Near or Above                    Optimal  130-159  mg/dL   Borderline  952-841  mg/dL   High  >324     mg/dL   Very High 06/27/270   ALT 19 06/01/2011   AST 25 06/20/2011   NA 142 06/20/2011   K 3.9 06/20/2011   CL 102 06/20/2011   CREATININE 1.0 06/20/2011   BUN 16 06/20/2011   CO2 29 06/20/2011   TSH 2.201 01/23/2011   INR 0.99 01/26/2011   HGBA1C 5.8* 01/23/2011   ASSESSMENT AND PLAN:   1. Small cell lung cancer:  He is status post 6 cycles of Carboplatin/Etoposide with very good response per restaging CT.  There is no evidence of metastatic disease.  As he has completed 6 cycles as per NCCN recommendation. He has been seen by Radiation Oncology for consideration of prophylactic intracranial irradiation, but the patient did not wish to pursue this. Discussed with the patient and son that based on labs and clinical exam, there is no evidence of recurrence of his cancer. He is scheduled for a restaging CT scan in September.   2. Headache/vertigo:  possibly related to his small cell  lung cancer which can be considered paraneoplastic syndrome. His symptoms now have resolved.   3.  Afib:  He is on rate control with his PCP and cardiologist. He is on Xarelto for CVA prophylaxis.   4. History of smoking: he no longer smokes cigarettes.   5.  Code status: FULL CODE.   6. Follow up: In 3 months with CT scan prior to visit.

## 2011-12-19 ENCOUNTER — Ambulatory Visit (HOSPITAL_COMMUNITY)
Admission: RE | Admit: 2011-12-19 | Discharge: 2011-12-19 | Disposition: A | Discharge status: Home / Self Care | Payer: Medicare Other | Source: Ambulatory Visit | Attending: Oncology | Admitting: Oncology

## 2011-12-19 ENCOUNTER — Other Ambulatory Visit (HOSPITAL_BASED_OUTPATIENT_CLINIC_OR_DEPARTMENT_OTHER): Payer: Medicare Other

## 2011-12-19 DIAGNOSIS — C3490 Malignant neoplasm of unspecified part of unspecified bronchus or lung: Secondary | ICD-10-CM | POA: Insufficient documentation

## 2011-12-19 DIAGNOSIS — R918 Other nonspecific abnormal finding of lung field: Secondary | ICD-10-CM | POA: Insufficient documentation

## 2011-12-19 DIAGNOSIS — Z9221 Personal history of antineoplastic chemotherapy: Secondary | ICD-10-CM | POA: Insufficient documentation

## 2011-12-19 DIAGNOSIS — J984 Other disorders of lung: Secondary | ICD-10-CM | POA: Insufficient documentation

## 2011-12-19 LAB — COMPREHENSIVE METABOLIC PANEL (CC13)
ALT: 24 U/L (ref 0–55)
AST: 22 U/L (ref 5–34)
Albumin: 3.9 g/dL (ref 3.5–5.0)
Alkaline Phosphatase: 65 U/L (ref 40–150)
BUN: 17 mg/dL (ref 7.0–26.0)
CO2: 24 mEq/L (ref 22–29)
Calcium: 9.7 mg/dL (ref 8.4–10.4)
Chloride: 106 mEq/L (ref 98–107)
Creatinine: 1.1 mg/dL (ref 0.7–1.3)
Glucose: 96 mg/dl (ref 70–99)
Potassium: 3.8 mEq/L (ref 3.5–5.1)
Sodium: 139 mEq/L (ref 136–145)
Total Bilirubin: 0.6 mg/dL (ref 0.20–1.20)
Total Protein: 7.6 g/dL (ref 6.4–8.3)

## 2011-12-19 LAB — CBC WITH DIFFERENTIAL/PLATELET
BASO%: 0.8 % (ref 0.0–2.0)
Basophils Absolute: 0 10*3/uL (ref 0.0–0.1)
EOS%: 8.4 % — ABNORMAL HIGH (ref 0.0–7.0)
Eosinophils Absolute: 0.4 10*3/uL (ref 0.0–0.5)
HCT: 40.4 % (ref 38.4–49.9)
HGB: 13.9 g/dL (ref 13.0–17.1)
LYMPH%: 36.2 % (ref 14.0–49.0)
MCH: 33.4 pg (ref 27.2–33.4)
MCHC: 34.4 g/dL (ref 32.0–36.0)
MCV: 97.1 fL (ref 79.3–98.0)
MONO#: 0.5 10*3/uL (ref 0.1–0.9)
MONO%: 10.4 % (ref 0.0–14.0)
NEUT#: 2.1 10*3/uL (ref 1.5–6.5)
NEUT%: 44.2 % (ref 39.0–75.0)
Platelets: 137 10*3/uL — ABNORMAL LOW (ref 140–400)
RBC: 4.16 10*6/uL — ABNORMAL LOW (ref 4.20–5.82)
RDW: 14.2 % (ref 11.0–14.6)
WBC: 4.7 10*3/uL (ref 4.0–10.3)
lymph#: 1.7 10*3/uL (ref 0.9–3.3)

## 2011-12-19 MED ORDER — IOHEXOL 300 MG/ML  SOLN
100.0000 mL | Freq: Once | INTRAMUSCULAR | Status: AC | PRN
  Administered 2011-12-19: 100 mL via INTRAVENOUS

## 2011-12-21 ENCOUNTER — Telehealth: Payer: Self-pay | Admitting: Oncology

## 2011-12-21 ENCOUNTER — Ambulatory Visit (HOSPITAL_BASED_OUTPATIENT_CLINIC_OR_DEPARTMENT_OTHER): Payer: Medicare Other | Admitting: Oncology

## 2011-12-21 VITALS — BP 120/74 | HR 60 | Temp 96.8°F | Resp 20 | Ht 68.0 in | Wt 158.2 lb

## 2011-12-21 DIAGNOSIS — C3490 Malignant neoplasm of unspecified part of unspecified bronchus or lung: Secondary | ICD-10-CM

## 2011-12-21 DIAGNOSIS — Z87891 Personal history of nicotine dependence: Secondary | ICD-10-CM

## 2011-12-21 DIAGNOSIS — I4891 Unspecified atrial fibrillation: Secondary | ICD-10-CM

## 2011-12-21 DIAGNOSIS — Z23 Encounter for immunization: Secondary | ICD-10-CM

## 2011-12-21 DIAGNOSIS — C342 Malignant neoplasm of middle lobe, bronchus or lung: Secondary | ICD-10-CM

## 2011-12-21 MED ORDER — INFLUENZA VIRUS VACC SPLIT PF IM SUSP
0.5000 mL | Freq: Once | INTRAMUSCULAR | Status: AC
  Administered 2011-12-21: 0.5 mL via INTRAMUSCULAR
  Filled 2011-12-21: qty 0.5

## 2011-12-21 NOTE — Telephone Encounter (Signed)
Printed and gv pt schedule for JAN.....sed

## 2011-12-21 NOTE — Progress Notes (Signed)
[facility]  Telephone:(336) [phone] Fax:(336) [phone]   OFFICE PROGRESS NOTE   Cc:  [name], MD  DIAGNOSIS: extensive stage small cell lung cancer; diagnosed in Oct 2012. Extensive stage due to extensive involvement of lung field, not candidate for concurrent radiation. No evidence of distant met.   PAST THERAPY: S/p 6 cycles of Carboplatin/Etoposide between 02/09/2011 and 06/04/2011.  He declined prophylactic intracranial irradiation.   CURRENT THERAPY: Watchful observation.  INTERVAL HISTORY: Gerald [name] 71 y.o. male returns for regular follow up with a son. He reports feeling relatively well. He has stable stamina. He is able to ambulate outside the house without problem. He is able to go to church on weekends. His appetite has improved, and he has gained weight. His dizziness and headache have significantly improved. He does not needed to take any medication for headache or dizziness anymore compared to prior to chemotherapy.  Patient denies fever, visual changes, confusion, drenching night sweats, palpable lymph node swelling, mucositis, odynophagia, dysphagia, nausea vomiting, jaundice, chest pain, palpitation, shortness of breath, dyspnea on exertion, productive cough, gum bleeding, epistaxis, hematemesis, hemoptysis, abdominal pain, abdominal swelling, early satiety, melena, hematochezia, hematuria, skin rash, spontaneous bleeding, joint swelling, joint pain, heat or cold intolerance, bowel bladder incontinence, back pain, focal motor weakness, paresthesia, depression, suicidal or homicidal ideation, feeling hopelessness.     Past Medical History  Diagnosis Date  . A-fib   . Hyperlipidemia   . Small cell lung cancer 12/2010  . Smoking   . CAD (coronary artery disease)     last cath 2008 with noncritical CAD  . Allergy     seasonal allergies  . GERD (gastroesophageal reflux disease)     Past Surgical History  Procedure Date  . Catherization 2008   cardiac catherization    Current Outpatient Prescriptions  Medication Sig Dispense Refill  . aspirin 81 MG tablet Take 81 mg by mouth daily.      . digoxin (LANOXIN) 0.25 MG tablet Take 250 mcg by mouth daily. 1/2 tablet daily      . meclizine (ANTIVERT) 12.5 MG tablet Take 12.5 mg by mouth every 6 (six) hours as needed.      . metoprolol tartrate (LOPRESSOR) 25 MG tablet Take 25 mg by mouth 2 (two) times daily. 1 1/2 tab twice daily      . Rivaroxaban (XARELTO) 20 MG TABS Take 20 mg by mouth daily.       . simvastatin (ZOCOR) 40 MG tablet Take 40 mg by mouth at bedtime.        . ondansetron (ZOFRAN) 8 MG tablet Take by mouth every 12 (twelve) hours as needed.        Current Facility-Administered Medications  Medication Dose Route Frequency Provider Last Rate Last Dose  . influenza  inactive virus vaccine (FLUZONE/FLUARIX) injection 0.5 mL  0.5 mL Intramuscular Once Exie Parody, MD   0.5 mL at 12/21/11 1037    ALLERGIES:   has no known allergies.  REVIEW OF SYSTEMS:  The rest of the 14-point review of system was negative.   Filed Vitals:   12/21/11 1005  BP: 120/74  Pulse: 60  Temp: 96.8 F (36 C)  Resp: 20   Wt Readings from Last 3 Encounters:  12/21/11 158 lb 3.2 oz (71.759 kg)  09/12/11 155 lb 14.4 oz (70.716 kg)  07/06/11 153 lb 6.4 oz (69.582 kg)   ECOG Performance status: 1  PHYSICAL EXAMINATION:  General:  Thin-appearing man, in no acute  distress.  Eyes:  no scleral icterus.  ENT:  There were no oropharyngeal lesions.  Neck was without thyromegaly.  Lymphatics:  Negative cervical, supraclavicular or axillary adenopathy.  Respiratory: lungs were clear bilaterally without wheezing or crackles.  Cardiovascular:  Regular rate and rhythm, S1/S2, without murmur, rub or gallop.  There was no pedal edema.  GI:  abdomen was soft, flat, nontender, nondistended, without organomegaly.  Muscoloskeletal:  no spinal tenderness of palpation of vertebral spine.  Skin exam was without  echymosis, petichae.  Neuro exam was nonfocal.  Patient was able to get on and off exam table without assistance.  Gait was normal.  Patient was alerted and oriented.  Attention was good.   Language was appropriate.  Mood was normal without depression.  Speech was not pressured.  Thought content was not tangential.      LABORATORY/RADIOLOGY DATA:  Lab Results  Component Value Date   WBC 4.7 12/19/2011   HGB 13.9 12/19/2011   HCT 40.4 12/19/2011   PLT 137* 12/19/2011   GLUCOSE 96 12/19/2011   CHOL  Value: 121        ATP III CLASSIFICATION:  <200     mg/dL   Desirable  409-811  mg/dL   Borderline High  >=914    mg/dL   High        7/82/9562   TRIG 29 09/11/2009   HDL 41 09/11/2009   LDLCALC  Value: 74        Total Cholesterol/HDL:CHD Risk Coronary Heart Disease Risk Table                     Men   Women  1/2 Average Risk   3.4   3.3  Average Risk       5.0   4.4  2 X Average Risk   9.6   7.1  3 X Average Risk  23.4   11.0        Use the calculated Patient Ratio above and the CHD Risk Table to determine the patient's CHD Risk.        ATP III CLASSIFICATION (LDL):  <100     mg/dL   Optimal  130-865  mg/dL   Near or Above                    Optimal  130-159  mg/dL   Borderline  784-696  mg/dL   High  >295     mg/dL   Very High 2/84/1324   ALKPHOS 65 12/19/2011   ALT 24 12/19/2011   AST 22 12/19/2011   NA 139 12/19/2011   K 3.8 12/19/2011   CL 106 12/19/2011   CREATININE 1.1 12/19/2011   BUN 17.0 12/19/2011   CO2 24 12/19/2011   INR 0.99 01/26/2011   HGBA1C 5.8* 01/23/2011   IMAGING:  I personally review the following CT scan and showed the patient and his son the images:    12/19/2011     CT CHEST    Findings:  A few shotty less than 1 cm mediastinal lymph nodes persist, none of which pathologically enlarged.  A new 1.4 cm right hilar lymph node is stable.  No new or enlarging lymphadenopathy is seen within the thorax.  Severe right upper lobe scarring and bullous disease is again seen, with a central  nodular density which remains nearly completely calcified.  Another calcified nodule also seen in the lateral right lung base is also stable. A few other  sub centimeter noncalcified right lung nodules are stable or less prominent in size. No new or enlarging pulmonary nodules or masses are identified.  No evidence of pleural or pericardial effusion.  No suspicious bone lesions are identified.    IMPRESSION:  1.  Stable 1.4 cm right hilar lymph node and shotty less than 1 cm mediastinal lymph nodes. 2.  Stable calcified and noncalcified right lung nodules. 3.  No new or progressive disease within the thorax.    CT ABDOMEN AND PELVIS    Findings:  The abdominal parenchymal organs including adrenal glands are normal in appearance.  Gallbladder is unremarkable.  No evidence of hydronephrosis.  No soft tissue masses identified within the abdomen.  No evidence of lymphadenopathy.  No evidence of inflammatory process or abnormal fluid collections.  No suspicious bone lesions are identified.    IMPRESSION: Negative.  No evidence of abdominal metastatic disease or other acute findings.   Original Report    ASSESSMENT AND PLAN:    1. History of extensive stage small cell lung cancer: He is status post 6 cycles of Carboplatin/Etoposide with very good response per restaging CT.  He declined prophylactic whole brain radiation.  There is no evidence of residual or recurrent or metastatic lung cancer this time on clinical history, physical exam, CT scan. As he does not have worsening symptoms of headache and dizziness, there is no indication for routine CT scan of the brain.  2. Afib: He is on rate control with his PCP and cardiologist. He is on Xarelto for CVA prophylaxis.   3. History of smoking: he no longer smokes cigarettes.   4. We discussed the pros and cons of influenza vaccination. He agreed to go with influenza vaccination today.  5. Follow up: Follow with Belenda Cruise, NP in about 4 months with  restaging chest CT alone.  The plan is for CT of the chest every 4 months for 2 years total and every 6 months thereafter.    The length of time of the face-to-face encounter was 15 minutes. More than 50% of time was spent counseling and coordination of care.

## 2011-12-21 NOTE — Patient Instructions (Addendum)
1. History of small cell lung cancer, extensive stage: No sign of disease today. 2. Results of CT scans December 19, 2011.  CT CHEST   Findings: A few shotty less than 1 cm mediastinal lymph nodes  persist, none of which pathologically enlarged. A new 1.4 cm right  hilar lymph node is stable. No new or enlarging lymphadenopathy is  seen within the thorax.  Severe right upper lobe scarring and bullous disease is again seen,  with a central nodular density which remains nearly completely  calcified. Another calcified nodule also seen in the lateral right  lung base is also stable. A few other sub centimeter noncalcified  right lung nodules are stable or less prominent in size. No new or  enlarging pulmonary nodules or masses are identified. No evidence  of pleural or pericardial effusion. No suspicious bone lesions are  identified.   IMPRESSION:   1. Stable 1.4 cm right hilar lymph node and shotty less than 1 cm  mediastinal lymph nodes.  2. Stable calcified and noncalcified right lung nodules.  3. No new or progressive disease within the thorax.   CT ABDOMEN AND PELVIS   Findings: The abdominal parenchymal organs including adrenal  glands are normal in appearance. Gallbladder is unremarkable. No  evidence of hydronephrosis.  No soft tissue masses identified within the abdomen. No evidence  of lymphadenopathy. No evidence of inflammatory process or  abnormal fluid collections. No suspicious bone lesions are  identified.   IMPRESSION:   Negative. No evidence of abdominal metastatic disease or other  acute findings.   C.  Follow up:  In about 4 months with CT chest the day prior.

## 2011-12-23 ENCOUNTER — Ambulatory Visit: Payer: Medicare Other | Admitting: Oncology

## 2012-04-16 ENCOUNTER — Other Ambulatory Visit (HOSPITAL_BASED_OUTPATIENT_CLINIC_OR_DEPARTMENT_OTHER): Payer: Medicare Other | Admitting: Lab

## 2012-04-16 ENCOUNTER — Telehealth: Payer: Self-pay | Admitting: Oncology

## 2012-04-16 ENCOUNTER — Ambulatory Visit (HOSPITAL_COMMUNITY)
Admission: RE | Admit: 2012-04-16 | Discharge: 2012-04-16 | Disposition: A | Discharge status: Home / Self Care | Payer: Medicare Other | Source: Ambulatory Visit | Attending: Oncology | Admitting: Oncology

## 2012-04-16 ENCOUNTER — Encounter: Payer: Self-pay | Admitting: Oncology

## 2012-04-16 ENCOUNTER — Ambulatory Visit (HOSPITAL_BASED_OUTPATIENT_CLINIC_OR_DEPARTMENT_OTHER): Payer: Medicare Other | Admitting: Oncology

## 2012-04-16 VITALS — BP 140/86 | HR 57 | Temp 97.1°F | Resp 20 | Ht 68.0 in | Wt 161.7 lb

## 2012-04-16 DIAGNOSIS — R51 Headache: Secondary | ICD-10-CM

## 2012-04-16 DIAGNOSIS — J984 Other disorders of lung: Secondary | ICD-10-CM | POA: Insufficient documentation

## 2012-04-16 DIAGNOSIS — C3490 Malignant neoplasm of unspecified part of unspecified bronchus or lung: Secondary | ICD-10-CM

## 2012-04-16 DIAGNOSIS — R918 Other nonspecific abnormal finding of lung field: Secondary | ICD-10-CM | POA: Insufficient documentation

## 2012-04-16 DIAGNOSIS — I712 Thoracic aortic aneurysm, without rupture, unspecified: Secondary | ICD-10-CM | POA: Insufficient documentation

## 2012-04-16 DIAGNOSIS — R42 Dizziness and giddiness: Secondary | ICD-10-CM

## 2012-04-16 DIAGNOSIS — I2510 Atherosclerotic heart disease of native coronary artery without angina pectoris: Secondary | ICD-10-CM | POA: Insufficient documentation

## 2012-04-16 LAB — CBC WITH DIFFERENTIAL/PLATELET
BASO%: 0.3 % (ref 0.0–2.0)
Basophils Absolute: 0 10*3/uL (ref 0.0–0.1)
EOS%: 8.1 % — ABNORMAL HIGH (ref 0.0–7.0)
Eosinophils Absolute: 0.4 10*3/uL (ref 0.0–0.5)
HCT: 38.1 % — ABNORMAL LOW (ref 38.4–49.9)
HGB: 13.4 g/dL (ref 13.0–17.1)
LYMPH%: 29.1 % (ref 14.0–49.0)
MCH: 33.5 pg — ABNORMAL HIGH (ref 27.2–33.4)
MCHC: 35.1 g/dL (ref 32.0–36.0)
MCV: 95.3 fL (ref 79.3–98.0)
MONO#: 0.6 10*3/uL (ref 0.1–0.9)
MONO%: 11.2 % (ref 0.0–14.0)
NEUT#: 2.8 10*3/uL (ref 1.5–6.5)
NEUT%: 51.3 % (ref 39.0–75.0)
Platelets: 126 10*3/uL — ABNORMAL LOW (ref 140–400)
RBC: 4 10*6/uL — ABNORMAL LOW (ref 4.20–5.82)
RDW: 13.6 % (ref 11.0–14.6)
WBC: 5.4 10*3/uL (ref 4.0–10.3)
lymph#: 1.6 10*3/uL (ref 0.9–3.3)

## 2012-04-16 LAB — COMPREHENSIVE METABOLIC PANEL (CC13)
ALT: 38 U/L (ref 0–55)
AST: 29 U/L (ref 5–34)
Albumin: 3.6 g/dL (ref 3.5–5.0)
Alkaline Phosphatase: 73 U/L (ref 40–150)
BUN: 16 mg/dL (ref 7.0–26.0)
CO2: 23 mEq/L (ref 22–29)
Calcium: 9 mg/dL (ref 8.4–10.4)
Chloride: 108 mEq/L — ABNORMAL HIGH (ref 98–107)
Creatinine: 1.2 mg/dL (ref 0.7–1.3)
Glucose: 104 mg/dl — ABNORMAL HIGH (ref 70–99)
Potassium: 3.5 mEq/L (ref 3.5–5.1)
Sodium: 141 mEq/L (ref 136–145)
Total Bilirubin: 0.35 mg/dL (ref 0.20–1.20)
Total Protein: 7 g/dL (ref 6.4–8.3)

## 2012-04-16 MED ORDER — IOHEXOL 300 MG/ML  SOLN
80.0000 mL | Freq: Once | INTRAMUSCULAR | Status: AC | PRN
  Administered 2012-04-16: 80 mL via INTRAVENOUS

## 2012-04-16 NOTE — Progress Notes (Signed)
[facility]  Telephone:(336) [phone] Fax:(336) [phone]   OFFICE PROGRESS NOTE   Cc:  [name], MD  DIAGNOSIS: extensive stage small cell lung cancer; diagnosed in Oct 2012. Extensive stage due to extensive involvement of lung field, not candidate for concurrent radiation. No evidence of distant met.   PAST THERAPY: S/p 6 cycles of Carboplatin/Etoposide between 02/09/2011 and 06/04/2011.  He declined prophylactic intracranial irradiation.   CURRENT THERAPY: Watchful observation.  INTERVAL HISTORY: Gerald [name] 72 y.o. male returns for regular follow up with a son. He reports feeling relatively well. He has stable stamina. He is able to ambulate outside the house without problem. He is able to go to church on weekends. His appetite has improved, and he has gained weight. His dizziness and headache have significantly improved. He does not needed to take any medication for headache or dizziness anymore compared to prior to chemotherapy. CT of the chest was scheduled about 1 hour prior to visit at results not available at the time of the visit.  Patient denies fever, visual changes, confusion, drenching night sweats, palpable lymph node swelling, mucositis, odynophagia, dysphagia, nausea vomiting, jaundice, chest pain, palpitation, shortness of breath, dyspnea on exertion, productive cough, gum bleeding, epistaxis, hematemesis, hemoptysis, abdominal pain, abdominal swelling, early satiety, melena, hematochezia, hematuria, skin rash, spontaneous bleeding, joint swelling, joint pain, heat or cold intolerance, bowel bladder incontinence, back pain, focal motor weakness, paresthesia, depression, suicidal or homicidal ideation, feeling hopelessness.     Past Medical History  Diagnosis Date  . A-fib   . Hyperlipidemia   . Small cell lung cancer 12/2010  . Smoking   . CAD (coronary artery disease)     last cath 2008 with noncritical CAD  . Allergy     seasonal allergies  .  GERD (gastroesophageal reflux disease)     Past Surgical History  Procedure Date  . Catherization 2008    cardiac catherization    Current Outpatient Prescriptions  Medication Sig Dispense Refill  . aspirin 81 MG tablet Take 81 mg by mouth daily.      . digoxin (LANOXIN) 0.25 MG tablet Take 250 mcg by mouth daily. 1/2 tablet daily      . meclizine (ANTIVERT) 12.5 MG tablet Take 12.5 mg by mouth every 6 (six) hours as needed.      . metoprolol tartrate (LOPRESSOR) 25 MG tablet Take 25 mg by mouth 2 (two) times daily. 1 1/2 tab twice daily      . ondansetron (ZOFRAN) 8 MG tablet Take by mouth every 12 (twelve) hours as needed.       . Rivaroxaban (XARELTO) 20 MG TABS Take 20 mg by mouth daily.       . simvastatin (ZOCOR) 40 MG tablet Take 40 mg by mouth at bedtime.          ALLERGIES:   has no known allergies.  REVIEW OF SYSTEMS:  The rest of the 14-point review of system was negative.   Filed Vitals:   04/16/12 1041  BP: 140/86  Pulse: 57  Temp: 97.1 F (36.2 C)  Resp: 20   Wt Readings from Last 3 Encounters:  04/16/12 161 lb 11.2 oz (73.347 kg)  12/21/11 158 lb 3.2 oz (71.759 kg)  09/12/11 155 lb 14.4 oz (70.716 kg)   ECOG Performance status: 1  PHYSICAL EXAMINATION:  General:  Thin-appearing man, in no acute distress.  Eyes:  no scleral icterus.  ENT:  There were no oropharyngeal lesions.  Neck was  without thyromegaly.  Lymphatics:  Negative cervical, supraclavicular or axillary adenopathy.  Respiratory: lungs were clear bilaterally without wheezing or crackles.  Cardiovascular:  Regular rate and rhythm, S1/S2, without murmur, rub or gallop.  There was no pedal edema.  GI:  abdomen was soft, flat, nontender, nondistended, without organomegaly.  Muscoloskeletal:  no spinal tenderness of palpation of vertebral spine.  Skin exam was without echymosis, petichae.  Neuro exam was nonfocal.  Patient was able to get on and off exam table without assistance.  Gait was normal.   Patient was alerted and oriented.  Attention was good.   Language was appropriate.  Mood was normal without depression.  Speech was not pressured.  Thought content was not tangential.      LABORATORY/RADIOLOGY DATA:  Lab Results  Component Value Date   WBC 5.4 04/16/2012   HGB 13.4 04/16/2012   HCT 38.1* 04/16/2012   PLT 126* 04/16/2012   GLUCOSE 104* 04/16/2012   CHOL  Value: 121        ATP III CLASSIFICATION:  <200     mg/dL   Desirable  213-086  mg/dL   Borderline High  >=578    mg/dL   High        4/69/6295   TRIG 29 09/11/2009   HDL 41 09/11/2009   LDLCALC  Value: 74        Total Cholesterol/HDL:CHD Risk Coronary Heart Disease Risk Table                     Men   Women  1/2 Average Risk   3.4   3.3  Average Risk       5.0   4.4  2 X Average Risk   9.6   7.1  3 X Average Risk  23.4   11.0        Use the calculated Patient Ratio above and the CHD Risk Table to determine the patient's CHD Risk.        ATP III CLASSIFICATION (LDL):  <100     mg/dL   Optimal  284-132  mg/dL   Near or Above                    Optimal  130-159  mg/dL   Borderline  440-102  mg/dL   High  >725     mg/dL   Very High 3/66/4403   ALKPHOS 73 04/16/2012   ALT 38 04/16/2012   AST 29 04/16/2012   NA 141 04/16/2012   K 3.5 04/16/2012   CL 108* 04/16/2012   CREATININE 1.2 04/16/2012   BUN 16.0 04/16/2012   CO2 23 04/16/2012   INR 0.99 01/26/2011   HGBA1C 5.8* 01/23/2011   IMAGING:    *RADIOLOGY REPORT*  Clinical Data: Small cell lung cancer.  CT CHEST WITH CONTRAST  Technique: Multidetector CT imaging of the chest was performed  following the standard protocol during bolus administration of  intravenous contrast.  Contrast: 80mL OMNIPAQUE IOHEXOL 300 MG/ML SOLN  Comparison: 12/19/2011.  Findings: The chest wall is unremarkable and stable. No  supraclavicular or axillary lymphadenopathy. The thyroid gland is  normal. The bony thorax is intact. No destructive bone lesions or  spinal canal compromise.  The heart is normal  in size. No pericardial effusion. No  mediastinal or hilar lymphadenopathy. Stable small scattered lymph  nodes. Coronary artery calcifications are demonstrated. 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 No dissection. The esophagus is grossly normal.  Examination of the lung parenchyma demonstrates stable apical bulla  and emphysematous changes. Stable right apical lesion with  associated calcifications. The calcified right middle lobe lung  lesion is also unchanged. Stable tiny scattered pulmonary nodules  in the right lung. No new or worrisome lesions. The left lung  remains clear. No pleural effusion.  The upper abdomen is unremarkable and stable.  IMPRESSION:  1. Stable CT appearance of the chest. No change in the right lung  lesions and no mediastinal or hilar adenopathy.  2. 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  Original Report Authenticated By: Rudie Meyer, M.D.    ASSESSMENT AND PLAN:    1. History of extensive stage small cell lung cancer: He is status post 6 cycles of Carboplatin/Etoposide with very good response per restaging CT.  He declined prophylactic whole brain radiation.  There is no evidence of residual or recurrent or metastatic lung cancer this time on clinical history, physical exam, CT scan. As he does not have worsening symptoms of headache and dizziness, there is no indication for routine CT scan of the brain. I have called his daughter Enid Derry with the results of the CT as they were not available to me at the time of his visit.  2. Afib: He is on rate control with his PCP and cardiologist. He is on Xarelto for CVA prophylaxis.   3. History of smoking: he no longer smokes cigarettes.   4. Follow up: Lab, CT and visit in 4 months. The plan is for CT of the chest every 4 months for 2 years total and every 6 months thereafter.     The length of time of the face-to-face encounter was 15 minutes. More than 50% of time was spent counseling and coordination of care.

## 2012-04-16 NOTE — Telephone Encounter (Signed)
gv and printed appt schedule for pt for May....the patient aware central scheduling will contact with d/t for ct

## 2012-04-17 ENCOUNTER — Other Ambulatory Visit: Payer: Medicare Other | Admitting: Lab

## 2012-04-17 ENCOUNTER — Other Ambulatory Visit (HOSPITAL_COMMUNITY): Payer: Medicare Other

## 2012-04-17 ENCOUNTER — Ambulatory Visit: Payer: Medicare Other | Admitting: Oncology

## 2012-08-14 ENCOUNTER — Ambulatory Visit (HOSPITAL_COMMUNITY)
Admission: RE | Admit: 2012-08-14 | Discharge: 2012-08-14 | Disposition: A | Discharge status: Home / Self Care | Payer: Medicare Other | Source: Ambulatory Visit | Attending: Oncology | Admitting: Oncology

## 2012-08-14 ENCOUNTER — Other Ambulatory Visit (HOSPITAL_BASED_OUTPATIENT_CLINIC_OR_DEPARTMENT_OTHER): Payer: Medicare Other | Admitting: Lab

## 2012-08-14 DIAGNOSIS — I700 Atherosclerosis of aorta: Secondary | ICD-10-CM | POA: Insufficient documentation

## 2012-08-14 DIAGNOSIS — Z9221 Personal history of antineoplastic chemotherapy: Secondary | ICD-10-CM | POA: Insufficient documentation

## 2012-08-14 DIAGNOSIS — I712 Thoracic aortic aneurysm, without rupture, unspecified: Secondary | ICD-10-CM | POA: Insufficient documentation

## 2012-08-14 DIAGNOSIS — C3490 Malignant neoplasm of unspecified part of unspecified bronchus or lung: Secondary | ICD-10-CM | POA: Insufficient documentation

## 2012-08-14 DIAGNOSIS — J984 Other disorders of lung: Secondary | ICD-10-CM | POA: Insufficient documentation

## 2012-08-14 DIAGNOSIS — I2510 Atherosclerotic heart disease of native coronary artery without angina pectoris: Secondary | ICD-10-CM | POA: Insufficient documentation

## 2012-08-14 DIAGNOSIS — C342 Malignant neoplasm of middle lobe, bronchus or lung: Secondary | ICD-10-CM

## 2012-08-14 LAB — CBC WITH DIFFERENTIAL/PLATELET
BASO%: 0.7 % (ref 0.0–2.0)
Basophils Absolute: 0 10*3/uL (ref 0.0–0.1)
EOS%: 10.7 % — ABNORMAL HIGH (ref 0.0–7.0)
Eosinophils Absolute: 0.5 10*3/uL (ref 0.0–0.5)
HCT: 38.9 % (ref 38.4–49.9)
HGB: 13.3 g/dL (ref 13.0–17.1)
LYMPH%: 38.5 % (ref 14.0–49.0)
MCH: 33.1 pg (ref 27.2–33.4)
MCHC: 34.2 g/dL (ref 32.0–36.0)
MCV: 96.6 fL (ref 79.3–98.0)
MONO#: 0.6 10*3/uL (ref 0.1–0.9)
MONO%: 11.4 % (ref 0.0–14.0)
NEUT#: 2 10*3/uL (ref 1.5–6.5)
NEUT%: 38.7 % — ABNORMAL LOW (ref 39.0–75.0)
Platelets: 146 10*3/uL (ref 140–400)
RBC: 4.02 10*6/uL — ABNORMAL LOW (ref 4.20–5.82)
RDW: 14.1 % (ref 11.0–14.6)
WBC: 5.1 10*3/uL (ref 4.0–10.3)
lymph#: 2 10*3/uL (ref 0.9–3.3)

## 2012-08-14 LAB — COMPREHENSIVE METABOLIC PANEL (CC13)
ALT: 16 U/L (ref 0–55)
AST: 19 U/L (ref 5–34)
Albumin: 3.5 g/dL (ref 3.5–5.0)
Alkaline Phosphatase: 68 U/L (ref 40–150)
BUN: 14.3 mg/dL (ref 7.0–26.0)
CO2: 26 mEq/L (ref 22–29)
Calcium: 8.7 mg/dL (ref 8.4–10.4)
Chloride: 108 mEq/L — ABNORMAL HIGH (ref 98–107)
Creatinine: 1.1 mg/dL (ref 0.7–1.3)
Glucose: 101 mg/dl — ABNORMAL HIGH (ref 70–99)
Potassium: 3.9 mEq/L (ref 3.5–5.1)
Sodium: 141 mEq/L (ref 136–145)
Total Bilirubin: 0.67 mg/dL (ref 0.20–1.20)
Total Protein: 7 g/dL (ref 6.4–8.3)

## 2012-08-14 MED ORDER — IOHEXOL 300 MG/ML  SOLN
100.0000 mL | Freq: Once | INTRAMUSCULAR | Status: AC | PRN
  Administered 2012-08-14: 100 mL via INTRAVENOUS

## 2012-08-14 NOTE — Patient Instructions (Addendum)
1.  Diagnosis:  History of small cell lung cancer. 2.  Status:  Continue to be in remission.  3.  Follow up:  In about 6 months.

## 2012-08-16 ENCOUNTER — Ambulatory Visit (HOSPITAL_BASED_OUTPATIENT_CLINIC_OR_DEPARTMENT_OTHER): Payer: Medicare Other | Admitting: Oncology

## 2012-08-16 ENCOUNTER — Telehealth: Payer: Self-pay | Admitting: Oncology

## 2012-08-16 VITALS — BP 138/84 | HR 51 | Temp 97.6°F | Resp 18 | Ht 68.0 in | Wt 162.2 lb

## 2012-08-16 DIAGNOSIS — C342 Malignant neoplasm of middle lobe, bronchus or lung: Secondary | ICD-10-CM

## 2012-08-16 DIAGNOSIS — I712 Thoracic aortic aneurysm, without rupture: Secondary | ICD-10-CM

## 2012-08-16 DIAGNOSIS — C3490 Malignant neoplasm of unspecified part of unspecified bronchus or lung: Secondary | ICD-10-CM

## 2012-08-16 DIAGNOSIS — I4891 Unspecified atrial fibrillation: Secondary | ICD-10-CM

## 2012-08-16 NOTE — Telephone Encounter (Signed)
Gave pt apt for lab and MD for September 2014 , informes pt to get x-ray before MD visit in September

## 2012-08-17 NOTE — Progress Notes (Signed)
[facility]  Telephone:([phone]) [phone] [phone]   OFFICE PROGRESS NOTE   Cc:  [name], MD  DIAGNOSIS: extensive stage small cell lung cancer; diagnosed in Oct 2012. Extensive stage due to extensive involvement of lung field, not candidate for concurrent radiation. No evidence of distant met.   PAST THERAPY: S/p 6 cycles of Carboplatin/Etoposide between 02/09/2011 and 06/04/2011.  He declined prophylactic intracranial irradiation.   CURRENT THERAPY: Watchful observation.  INTERVAL HISTORY: Gerald [name] 72 y.o. male returns for regular follow up with his son-in-law.  He has mild headache, dizziness; however, much better compared to last year.  He denied fever, anorexia, weight loss, fatigue, visual changes, confusion, drenching night sweats, palpable lymph node swelling, mucositis, odynophagia, dysphagia, nausea vomiting, jaundice, chest pain, palpitation, shortness of breath, dyspnea on exertion, productive cough, gum bleeding, epistaxis, hematemesis, hemoptysis, abdominal pain, abdominal swelling, early satiety, melena, hematochezia, hematuria, skin rash, spontaneous bleeding, joint swelling, joint pain, heat or cold intolerance, bowel bladder incontinence, back pain, focal motor weakness, paresthesia, depression.     Past Medical History  Diagnosis Date  . A-fib   . Hyperlipidemia   . Small cell lung cancer 12/2010  . Smoking   . CAD (coronary artery disease)     last cath 2008 with noncritical CAD  . Allergy     seasonal allergies  . GERD (gastroesophageal reflux disease)     Past Surgical History  Procedure Laterality Date  . Catherization  2008    cardiac catherization    Current Outpatient Prescriptions  Medication Sig Dispense Refill  . aspirin 81 MG tablet Take 81 mg by mouth daily.      . digoxin (LANOXIN) 0.25 MG tablet Take 250 mcg by mouth daily. 1/2 tablet daily      . meclizine (ANTIVERT) 12.5 MG tablet Take 12.5 mg by mouth every 6  (six) hours as needed.      . metoprolol tartrate (LOPRESSOR) 25 MG tablet Take 25 mg by mouth 2 (two) times daily. 1 1/2 tab twice daily      . ondansetron (ZOFRAN) 8 MG tablet Take by mouth every 12 (twelve) hours as needed.       . Rivaroxaban (XARELTO) 20 MG TABS Take 20 mg by mouth daily.       . simvastatin (ZOCOR) 40 MG tablet Take 40 mg by mouth at bedtime.         No current facility-administered medications for this visit.    ALLERGIES:  has No Known Allergies.  REVIEW OF SYSTEMS:  The rest of the 14-point review of system was negative.   Filed Vitals:   08/16/12 1012  BP: 138/84  Pulse: 51  Temp: 97.6 F (36.4 C)  Resp: 18   Wt Readings from Last 3 Encounters:  08/16/12 162 lb 3.2 oz (73.573 kg)  04/16/12 161 lb 11.2 oz (73.347 kg)  12/21/11 158 lb 3.2 oz (71.759 kg)   ECOG Performance status: 0-1  PHYSICAL EXAMINATION:  General:  Thin-appearing man, in no acute distress.  Eyes:  no scleral icterus.  ENT:  There were no oropharyngeal lesions.  Neck was without thyromegaly.  Lymphatics:  Negative cervical, supraclavicular or axillary adenopathy.  Respiratory: lungs were clear bilaterally without wheezing or crackles.  Cardiovascular:  Regular rate and rhythm, S1/S2, without murmur, rub or gallop.  There was no pedal edema.  GI:  abdomen was soft, flat, nontender, nondistended, without organomegaly.  Muscoloskeletal:  no spinal tenderness of palpation of vertebral spine.  Skin exam was without echymosis, petichae.  Neuro exam was nonfocal.  Patient was able to get on and off exam table without assistance.  Gait was normal.  Patient was alert and oriented.  Attention was good.   Language was appropriate.  Mood was normal without depression.  Speech was not pressured.  Thought content was not tangential.      LABORATORY/RADIOLOGY DATA:  Lab Results  Component Value Date   WBC 5.1 08/14/2012   HGB 13.3 08/14/2012   HCT 38.9 08/14/2012   PLT 146 08/14/2012   GLUCOSE 101*  08/14/2012   CHOL  Value: 121        ATP III CLASSIFICATION:  <200     mg/dL   Desirable  161-096  mg/dL   Borderline High  >=045    mg/dL   High        07/04/8117   TRIG 29 09/11/2009   HDL 41 09/11/2009   LDLCALC  Value: 74        Total Cholesterol/HDL:CHD Risk Coronary Heart Disease Risk Table                     Men   Women  1/2 Average Risk   3.4   3.3  Average Risk       5.0   4.4  2 X Average Risk   9.6   7.1  3 X Average Risk  23.4   11.0        Use the calculated Patient Ratio above and the CHD Risk Table to determine the patient's CHD Risk.        ATP III CLASSIFICATION (LDL):  <100     mg/dL   Optimal  147-829  mg/dL   Near or Above                    Optimal  130-159  mg/dL   Borderline  562-130  mg/dL   High  >865     mg/dL   Very High 7/84/6962   ALKPHOS 68 08/14/2012   ALT 16 08/14/2012   AST 19 08/14/2012   NA 141 08/14/2012   K 3.9 08/14/2012   CL 108* 08/14/2012   CREATININE 1.1 08/14/2012   BUN 14.3 08/14/2012   CO2 26 08/14/2012   INR 0.99 01/26/2011   HGBA1C 5.8* 01/23/2011   IMAGING:  I personally review the following CT scan and showed the patient and his son the images:   CT CHEST WITH CONTRAST  Technique: Multidetector CT imaging of the chest was performed  following the standard protocol during bolus administration of  intravenous contrast. Sagittal and coronal MPR images reconstructed  from axial data set.  Contrast: OMNIPAQUE IOHEXOL 300 MG/ML SOLN  Comparison: 04/16/2012  Findings:  Thoracic vascular structures patent with scattered atherosclerotic  calcifications aorta and coronary arteries.  Minimal aneurysmal dilatation ascending thoracic aorta of 4.1 x 4.0  cm image 30.  Visualized portion of upper abdomen unremarkable.  No definite thoracic adenopathy.  Bullous disease right apex.  Stellate density right upper lobe with associated dense  calcifications.  Soft tissue component seen on previous exam have relatively stable,  12 x 4 mm image 17.  Calcified  granulomata lateral right lung base image 39 and in left  upper lobe.  Minimal dependent bibasilar atelectasis.  No acute infiltrate, pleural effusion or pneumothorax.  No enlarging or new pulmonary mass/nodule.  No acute osseous findings.  IMPRESSION:  Stable stellate right apical  lesion with calcifications, scarring,  and minimal soft tissue component unchanged from previous study.  Scattered old granulomatous disease changes and right apical  bullous disease. Minimal dependent bibasilar atelectasis.  No new intrathoracic abnormalities.  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    ASSESSMENT AND PLAN:    1. History of extensive stage small cell lung cancer: continue to be in remission.  He does not smoke or chew tobacco.    2. Afib: He is on rate control with his PCP and cardiologist. He is on Xarelto for CVA prophylaxis.   3. Minimal aneurysmal dilatation ascending thoracic aorta:  I advised him to control his blood pressure with low salt diet.  I advised him to follow Dr. Azucena Cecil to follow up on this issue.   4. Follow up: Follow with Belenda Cruise, NP in about 4 months with PA/lat CXR.  Once yearly CT but sooner if concerning symptoms.   I informed Gerald [name] that I am leaving the practice.  [facility] will [facility] for him to see another provider when he returns.    The length of time of the face-to-face encounter was 10 minutes. More than 50% of time was spent counseling and coordination of care.

## 2012-10-31 ENCOUNTER — Other Ambulatory Visit: Payer: Self-pay

## 2012-12-03 ENCOUNTER — Other Ambulatory Visit: Payer: Self-pay | Admitting: Cardiovascular Disease

## 2012-12-04 NOTE — Telephone Encounter (Signed)
Rx was sent to pharmacy electronically. 

## 2012-12-17 ENCOUNTER — Ambulatory Visit (HOSPITAL_BASED_OUTPATIENT_CLINIC_OR_DEPARTMENT_OTHER): Payer: Medicare Other | Admitting: Hematology and Oncology

## 2012-12-17 ENCOUNTER — Other Ambulatory Visit (HOSPITAL_BASED_OUTPATIENT_CLINIC_OR_DEPARTMENT_OTHER): Payer: Medicare Other | Admitting: Lab

## 2012-12-17 ENCOUNTER — Telehealth: Payer: Self-pay | Admitting: Hematology and Oncology

## 2012-12-17 ENCOUNTER — Encounter: Payer: Self-pay | Admitting: Hematology and Oncology

## 2012-12-17 ENCOUNTER — Ambulatory Visit (HOSPITAL_COMMUNITY)
Admission: RE | Admit: 2012-12-17 | Discharge: 2012-12-17 | Disposition: A | Discharge status: Home / Self Care | Payer: Medicare Other | Source: Ambulatory Visit | Attending: Oncology | Admitting: Oncology

## 2012-12-17 VITALS — BP 118/82 | HR 51 | Temp 97.4°F | Resp 18 | Ht 68.0 in | Wt 160.3 lb

## 2012-12-17 DIAGNOSIS — C3490 Malignant neoplasm of unspecified part of unspecified bronchus or lung: Secondary | ICD-10-CM

## 2012-12-17 DIAGNOSIS — R519 Headache, unspecified: Secondary | ICD-10-CM

## 2012-12-17 DIAGNOSIS — R51 Headache: Secondary | ICD-10-CM

## 2012-12-17 HISTORY — DX: Headache, unspecified: R51.9

## 2012-12-17 LAB — CBC WITH DIFFERENTIAL/PLATELET
BASO%: 0.8 % (ref 0.0–2.0)
Basophils Absolute: 0 10*3/uL (ref 0.0–0.1)
EOS%: 6.9 % (ref 0.0–7.0)
Eosinophils Absolute: 0.2 10*3/uL (ref 0.0–0.5)
HCT: 37.4 % — ABNORMAL LOW (ref 38.4–49.9)
HGB: 12.8 g/dL — ABNORMAL LOW (ref 13.0–17.1)
LYMPH%: 53.3 % — ABNORMAL HIGH (ref 14.0–49.0)
MCH: 33 pg (ref 27.2–33.4)
MCHC: 34.1 g/dL (ref 32.0–36.0)
MCV: 96.8 fL (ref 79.3–98.0)
MONO#: 0.4 10*3/uL (ref 0.1–0.9)
MONO%: 11.5 % (ref 0.0–14.0)
NEUT#: 1 10*3/uL — ABNORMAL LOW (ref 1.5–6.5)
NEUT%: 27.5 % — ABNORMAL LOW (ref 39.0–75.0)
Platelets: 113 10*3/uL — ABNORMAL LOW (ref 140–400)
RBC: 3.87 10*6/uL — ABNORMAL LOW (ref 4.20–5.82)
RDW: 14 % (ref 11.0–14.6)
WBC: 3.5 10*3/uL — ABNORMAL LOW (ref 4.0–10.3)
lymph#: 1.9 10*3/uL (ref 0.9–3.3)

## 2012-12-17 LAB — COMPREHENSIVE METABOLIC PANEL (CC13)
ALT: 17 U/L (ref 0–55)
AST: 19 U/L (ref 5–34)
Albumin: 3.5 g/dL (ref 3.5–5.0)
Alkaline Phosphatase: 62 U/L (ref 40–150)
BUN: 15.8 mg/dL (ref 7.0–26.0)
CO2: 24 mEq/L (ref 22–29)
Calcium: 8.8 mg/dL (ref 8.4–10.4)
Chloride: 110 mEq/L — ABNORMAL HIGH (ref 98–109)
Creatinine: 1.1 mg/dL (ref 0.7–1.3)
Glucose: 99 mg/dl (ref 70–140)
Potassium: 3.7 mEq/L (ref 3.5–5.1)
Sodium: 141 mEq/L (ref 136–145)
Total Bilirubin: 0.56 mg/dL (ref 0.20–1.20)
Total Protein: 6.9 g/dL (ref 6.4–8.3)

## 2012-12-17 NOTE — Telephone Encounter (Signed)
gve the pt's dtr the oct f/u appt along with the ct scan appt with the oral contrast and instructions. Pt's dtr did not require an  Equ[employer].

## 2012-12-17 NOTE — Progress Notes (Signed)
[facility] OFFICE PROGRESS NOTE  Gerald Hoff, MD [address] [place] [address] No chief complaint on file.   DIAGNOSIS: extensive stage small cell lung cancer; diagnosed in Oct 2012. Extensive stage due to extensive involvement of lung field, not candidate for concurrent radiation. No evidence of distant met.   PAST THERAPY: S/p 6 cycles of Carboplatin/Etoposide between 02/09/2011 and 06/04/2011.  He declined prophylactic intracranial irradiation.   CURRENT THERAPY: Watchful observation. INTERVAL HISTORY: Gerald [name] 72 y.o. male returns for routine followup. He denies any cough, hemoptysis, new probable adenopathy, or abnormal weight loss. He is complaining of regular headaches on a weekly basis, center around the back of his head with no radiation. He's been taking Advil regularly to control this. He denies any seizures activity or neurological deficit.  I have reviewed the past medical history, past surgical history, social history and family history with the patient and they are unchanged from previous note.  ALLERGIES:  has No Known Allergies.  MEDICATIONS: has a current medication list which includes the following prescription(s): aspirin, digoxin, metoprolol tartrate, rivaroxaban, simvastatin, and vitamin d (ergocalciferol).  REVIEW OF SYSTEMS:   Constitutional: Denies fevers, chills or abnormal weight loss Eyes: Denies blurriness of vision Ears, nose, mouth, throat, and face: Denies mucositis or sore throat Respiratory: Denies cough, dyspnea or wheezes Cardiovascular: Denies palpitation, chest discomfort or lower extremity swelling Gastrointestinal:  Denies nausea, heartburn or change in bowel habits Skin: Denies abnormal skin rashes Lymphatics: Denies new lymphadenopathy or easy bruising Neurological:Denies numbness, tingling or new weaknesses Behavioral/Psych: Mood is stable, no new changes  All other systems were reviewed with the patient and are  negative.  PHYSICAL EXAMINATION: ECOG PERFORMANCE STATUS: 0 - Asymptomatic  Filed Vitals:   12/17/12 1507  BP: 118/82  Pulse: 51  Temp: 97.4 F (36.3 C)  Resp: 18    GENERAL:alert, no distress and comfortable SKIN: skin color, texture, turgor are normal, no rashes or significant lesions EYES: normal, Conjunctiva are pink and non-injected, sclera clear OROPHARYNX:no exudate, no erythema and lips, buccal mucosa, and tongue normal  NECK: supple, thyroid normal size, non-tender, without nodularity LYMPH:  no palpable lymphadenopathy in the cervical, axillary or inguinal LUNGS: clear to auscultation and percussion with normal breathing effort HEART: regular rate & rhythm and no murmurs and no lower extremity edema ABDOMEN:abdomen soft, non-tender and normal bowel sounds Musculoskeletal:no cyanosis of digits and no clubbing  NEURO: alert & oriented x 3 with fluent speech, no focal motor/sensory deficits  LABORATORY DATA:  I have reviewed the data as listed    Component Value Date/Time   NA 141 12/17/2012 1439   NA 139 09/12/2011 1010   NA 142 06/20/2011 1158   K 3.7 12/17/2012 1439   K 3.7 09/12/2011 1010   K 3.9 06/20/2011 1158   CL 108* 08/14/2012 0958   CL 107 09/12/2011 1010   CL 102 06/20/2011 1158   CO2 24 12/17/2012 1439   CO2 26 09/12/2011 1010   CO2 29 06/20/2011 1158   GLUCOSE 99 12/17/2012 1439   GLUCOSE 101* 08/14/2012 0958   GLUCOSE 100* 09/12/2011 1010   GLUCOSE 99 06/20/2011 1158   BUN 15.8 12/17/2012 1439   BUN 20 09/12/2011 1010   BUN 16 06/20/2011 1158   CREATININE 1.1 12/17/2012 1439   CREATININE 1.18 09/12/2011 1010   CREATININE 1.0 06/20/2011 1158   CALCIUM 8.8 12/17/2012 1439   CALCIUM 8.8 09/12/2011 1010   CALCIUM 8.4 06/20/2011 1158   PROT 6.9  12/17/2012 1439   PROT 6.6 09/12/2011 1010   PROT 7.7 06/20/2011 1158   ALBUMIN 3.5 12/17/2012 1439   ALBUMIN 3.8 09/12/2011 1010   AST 19 12/17/2012 1439   AST 25 09/12/2011 1010   AST 25 06/20/2011 1158   ALT 17 12/17/2012 1439    ALT 28 09/12/2011 1010   ALT 28 06/20/2011 1158   ALKPHOS 62 12/17/2012 1439   ALKPHOS 67 09/12/2011 1010   ALKPHOS 95* 06/20/2011 1158   BILITOT 0.56 12/17/2012 1439   BILITOT 0.3 09/12/2011 1010   BILITOT 0.50 06/20/2011 1158   GFRNONAA 81* 01/24/2011 0155   GFRAA >90 01/24/2011 0155    I No results found for this basename: SPEP, UPEP,  kappa and lambda light chains    Lab Results  Component Value Date   WBC 3.5* 12/17/2012   NEUTROABS 1.0* 12/17/2012   HGB 12.8* 12/17/2012   HCT 37.4* 12/17/2012   MCV 96.8 12/17/2012   PLT 113* 12/17/2012      Chemistry      Component Value Date/Time   NA 141 12/17/2012 1439   NA 139 09/12/2011 1010   NA 142 06/20/2011 1158   K 3.7 12/17/2012 1439   K 3.7 09/12/2011 1010   K 3.9 06/20/2011 1158   CL 108* 08/14/2012 0958   CL 107 09/12/2011 1010   CL 102 06/20/2011 1158   CO2 24 12/17/2012 1439   CO2 26 09/12/2011 1010   CO2 29 06/20/2011 1158   BUN 15.8 12/17/2012 1439   BUN 20 09/12/2011 1010   BUN 16 06/20/2011 1158   CREATININE 1.1 12/17/2012 1439   CREATININE 1.18 09/12/2011 1010   CREATININE 1.0 06/20/2011 1158      Component Value Date/Time   CALCIUM 8.8 12/17/2012 1439   CALCIUM 8.8 09/12/2011 1010   CALCIUM 8.4 06/20/2011 1158   ALKPHOS 62 12/17/2012 1439   ALKPHOS 67 09/12/2011 1010   ALKPHOS 95* 06/20/2011 1158   AST 19 12/17/2012 1439   AST 25 09/12/2011 1010   AST 25 06/20/2011 1158   ALT 17 12/17/2012 1439   ALT 28 09/12/2011 1010   ALT 28 06/20/2011 1158   BILITOT 0.56 12/17/2012 1439   BILITOT 0.3 09/12/2011 1010   BILITOT 0.50 06/20/2011 1158     RADIOGRAPHIC STUDIES: I have personally reviewed the radiological images as listed and agreed with the findings in the report. Dg Chest 2 View  12/17/2012   CLINICAL DATA:  History of small cell lung cancer.  EXAM: CHEST  2 VIEW  COMPARISON:  Chest CT Aug 14, 2012, chest x-ray February 28, 2013  FINDINGS: There is a conglomerate of partial calcified mass in the right apex measuring 1.7 x 2.5 cm.  This is not significantly changed in appearance compared to prior chest CT of 2014 coronal images. There is a 6 mm nodule in the lateral right lung base which is not significantly changed compared a prior CT of Aug 14, 2012. There is no focal pneumonia, pulmonary edema, or pleural effusion. Minimal atelectasis of the left lung base is identified. The aorta is tortuous. The heart size is normal. The soft tissues and osseous structures are stable.  IMPRESSION: Conglomerate partial calcified masslike area in the right apex and 6 mm nodule in the lateral right lung base unchanged compared prior CT of May 2014.   Electronically Signed   By: Sherian Rein   On: 12/17/2012 14:43   ASSESSMENT: History of extensive stage small cell lung cancer, new onset  of headache   PLAN:  #1 history of small cell lung cancer #2 new onset headache I am concerned about his symptoms of headache. Small cell lung cancer have a propensity to spread to the brain. His headache could be related to this or could be just due to simple headaches. On review of his imaging study, he still had persistent mass, although the radiologist did not feel it has changed significantly. I recommend restaging with CT scan of the chest abdomen pelvis and MRI of the head to make sure that he does not have disease progression. In the meantime we'll continue Advil as needed  All questions were answered. The patient knows to call the clinic with any problems, questions or concerns. We can certainly see the patient much sooner if necessary. No barriers to learning was detected.  The patient and plan discussed with [facility], Rasheen Schewe  and he is in agreement with the aforementioned.  I spent 40 minutes counseling the patient face to face. The total time spent in the appointment was 60 minutes and more than 50% was on counseling.     [facility], Hattye Siegfried, MD 12/17/2012 3:44 PM

## 2012-12-31 ENCOUNTER — Ambulatory Visit: Payer: Medicare Other | Admitting: Hematology and Oncology

## 2012-12-31 ENCOUNTER — Ambulatory Visit (HOSPITAL_COMMUNITY)
Admission: RE | Admit: 2012-12-31 | Discharge: 2012-12-31 | Disposition: A | Discharge status: Home / Self Care | Payer: Medicare Other | Source: Ambulatory Visit | Attending: Hematology and Oncology | Admitting: Hematology and Oncology

## 2012-12-31 ENCOUNTER — Encounter (HOSPITAL_COMMUNITY): Payer: Self-pay

## 2012-12-31 ENCOUNTER — Other Ambulatory Visit: Payer: Self-pay | Admitting: Hematology and Oncology

## 2012-12-31 DIAGNOSIS — I2510 Atherosclerotic heart disease of native coronary artery without angina pectoris: Secondary | ICD-10-CM | POA: Insufficient documentation

## 2012-12-31 DIAGNOSIS — C3490 Malignant neoplasm of unspecified part of unspecified bronchus or lung: Secondary | ICD-10-CM

## 2012-12-31 DIAGNOSIS — I700 Atherosclerosis of aorta: Secondary | ICD-10-CM | POA: Insufficient documentation

## 2012-12-31 DIAGNOSIS — R51 Headache: Secondary | ICD-10-CM | POA: Insufficient documentation

## 2012-12-31 DIAGNOSIS — R519 Headache, unspecified: Secondary | ICD-10-CM

## 2012-12-31 DIAGNOSIS — I77810 Thoracic aortic ectasia: Secondary | ICD-10-CM | POA: Diagnosis not present

## 2012-12-31 MED ORDER — GADOBENATE DIMEGLUMINE 529 MG/ML IV SOLN
15.0000 mL | Freq: Once | INTRAVENOUS | Status: AC | PRN
  Administered 2012-12-31: 15 mL via INTRAVENOUS

## 2012-12-31 MED ORDER — IOHEXOL 300 MG/ML  SOLN
80.0000 mL | Freq: Once | INTRAMUSCULAR | Status: AC | PRN
  Administered 2012-12-31: 100 mL via INTRAVENOUS

## 2013-01-02 ENCOUNTER — Telehealth: Payer: Self-pay | Admitting: Hematology and Oncology

## 2013-01-02 ENCOUNTER — Encounter: Payer: Self-pay | Admitting: Hematology and Oncology

## 2013-01-02 ENCOUNTER — Ambulatory Visit (HOSPITAL_BASED_OUTPATIENT_CLINIC_OR_DEPARTMENT_OTHER): Payer: Medicare Other | Admitting: Hematology and Oncology

## 2013-01-02 VITALS — BP 116/78 | HR 55 | Temp 97.9°F | Resp 20 | Ht 68.0 in | Wt 161.0 lb

## 2013-01-02 DIAGNOSIS — C342 Malignant neoplasm of middle lobe, bronchus or lung: Secondary | ICD-10-CM

## 2013-01-02 DIAGNOSIS — C3490 Malignant neoplasm of unspecified part of unspecified bronchus or lung: Secondary | ICD-10-CM

## 2013-01-02 NOTE — Progress Notes (Signed)
[facility] OFFICE PROGRESS NOTE  [name], MD  DIAGNOSIS: extensive stage small cell lung cancer; diagnosed in Oct 2012. Extensive stage due to extensive involvement of lung field, not candidate for concurrent radiation. No evidence of distant met.   PAST THERAPY: S/p 6 cycles of Carboplatin/Etoposide between 02/09/2011 and 06/04/2011.  He declined prophylactic intracranial irradiation.   INTERVAL HISTORY: Gerald [name] 72 y.o. male returns for followup on test results, order from prior visit. He is to have intermittent headaches but they were not worse. No new neurological deficit. No other new symptoms.  I have reviewed the past medical history, past surgical history, social history and family history with the patient and they are unchanged from previous note.  ALLERGIES:  has No Known Allergies.  MEDICATIONS: Current outpatient prescriptions:aspirin 81 MG tablet, Take 81 mg by mouth daily., Disp: , Rfl: ;  digoxin (LANOXIN) 0.25 MG tablet, Take 250 mcg by mouth daily. 1/2 tablet daily, Disp: , Rfl: ;  metoprolol tartrate (LOPRESSOR) 25 MG tablet, Take 1.5 tablets (37.5 mg total) by mouth 2 (two) times daily., Disp: 90 tablet, Rfl: 2;  Rivaroxaban (XARELTO) 20 MG TABS, Take 20 mg by mouth daily. , Disp: , Rfl:  simvastatin (ZOCOR) 40 MG tablet, Take 40 mg by mouth at bedtime.  , Disp: , Rfl: ;  Vitamin D, Ergocalciferol, (DRISDOL) 50000 UNITS CAPS capsule, Take 50,000 Units by mouth., Disp: , Rfl:   REVIEW OF SYSTEMS:   Constitutional: Denies fevers, chills or abnormal weight loss All other systems were reviewed with the patient and are negative.  PHYSICAL EXAMINATION: ECOG PERFORMANCE STATUS: 0 - Asymptomatic  Filed Vitals:   01/02/13 1512  BP: 116/78  Pulse: 55  Temp: 97.9 F (36.6 C)  Resp: 20   Filed Weights   01/02/13 1512  Weight: 161 lb (73.029 kg)    GENERAL:alert, no distress and comfortable NEURO: alert & oriented x 3 with fluent speech, no focal  motor/sensory deficits  LABORATORY DATA:  I have reviewed the data as listed    Component Value Date/Time   NA 141 12/17/2012 1439   NA 139 09/12/2011 1010   NA 142 06/20/2011 1158   K 3.7 12/17/2012 1439   K 3.7 09/12/2011 1010   K 3.9 06/20/2011 1158   CL 108* 08/14/2012 0958   CL 107 09/12/2011 1010   CL 102 06/20/2011 1158   CO2 24 12/17/2012 1439   CO2 26 09/12/2011 1010   CO2 29 06/20/2011 1158   GLUCOSE 99 12/17/2012 1439   GLUCOSE 101* 08/14/2012 0958   GLUCOSE 100* 09/12/2011 1010   GLUCOSE 99 06/20/2011 1158   BUN 15.8 12/17/2012 1439   BUN 20 09/12/2011 1010   BUN 16 06/20/2011 1158   CREATININE 1.1 12/17/2012 1439   CREATININE 1.18 09/12/2011 1010   CREATININE 1.0 06/20/2011 1158   CALCIUM 8.8 12/17/2012 1439   CALCIUM 8.8 09/12/2011 1010   CALCIUM 8.4 06/20/2011 1158   PROT 6.9 12/17/2012 1439   PROT 6.6 09/12/2011 1010   PROT 7.7 06/20/2011 1158   ALBUMIN 3.5 12/17/2012 1439   ALBUMIN 3.8 09/12/2011 1010   AST 19 12/17/2012 1439   AST 25 09/12/2011 1010   AST 25 06/20/2011 1158   ALT 17 12/17/2012 1439   ALT 28 09/12/2011 1010   ALT 28 06/20/2011 1158   ALKPHOS 62 12/17/2012 1439   ALKPHOS 67 09/12/2011 1010   ALKPHOS 95* 06/20/2011 1158   BILITOT 0.56 12/17/2012 1439   BILITOT 0.3 09/12/2011  1010   BILITOT 0.50 06/20/2011 1158   GFRNONAA 81* 01/24/2011 0155   GFRAA >90 01/24/2011 0155    No results found for this basename: SPEP,  UPEP,   kappa and lambda light chains    Lab Results  Component Value Date   WBC 3.5* 12/17/2012   NEUTROABS 1.0* 12/17/2012   HGB 12.8* 12/17/2012   HCT 37.4* 12/17/2012   MCV 96.8 12/17/2012   PLT 113* 12/17/2012      Chemistry      Component Value Date/Time   NA 141 12/17/2012 1439   NA 139 09/12/2011 1010   NA 142 06/20/2011 1158   K 3.7 12/17/2012 1439   K 3.7 09/12/2011 1010   K 3.9 06/20/2011 1158   CL 108* 08/14/2012 0958   CL 107 09/12/2011 1010   CL 102 06/20/2011 1158   CO2 24 12/17/2012 1439   CO2 26 09/12/2011 1010   CO2 29 06/20/2011 1158    BUN 15.8 12/17/2012 1439   BUN 20 09/12/2011 1010   BUN 16 06/20/2011 1158   CREATININE 1.1 12/17/2012 1439   CREATININE 1.18 09/12/2011 1010   CREATININE 1.0 06/20/2011 1158      Component Value Date/Time   CALCIUM 8.8 12/17/2012 1439   CALCIUM 8.8 09/12/2011 1010   CALCIUM 8.4 06/20/2011 1158   ALKPHOS 62 12/17/2012 1439   ALKPHOS 67 09/12/2011 1010   ALKPHOS 95* 06/20/2011 1158   AST 19 12/17/2012 1439   AST 25 09/12/2011 1010   AST 25 06/20/2011 1158   ALT 17 12/17/2012 1439   ALT 28 09/12/2011 1010   ALT 28 06/20/2011 1158   BILITOT 0.56 12/17/2012 1439   BILITOT 0.3 09/12/2011 1010   BILITOT 0.50 06/20/2011 1158       RADIOGRAPHIC STUDIES: I have personally reviewed the radiological images as listed and agreed with the findings in the report. CT scan of the chest abdomen and pelvis as well as MRI of the head were reviewed by myself and I agree there is no evidence to suggest disease recurrence   ASSESSMENT: History of extensive stage small cell lung cancer, no evidence of disease  PLAN:  #1 small cell lung cancer, no evidence of disease Through his son, I explained to him this no evidence to suggest disease recurrence. The patient does not smoke. I recommend that I see him back in the near future with history, physical examination, and blood work in 3-4 months. The patient desired to fly back to his home country for 4-5 months and we'll only return in the spring. That would produce to 6 months away from now. I think it is reasonable to wait until then to see me, provided that they will report any signs or symptoms to suggest disease recurrence as soon as possible. Some of these symptoms include worsening headache, new areas of bone pain, anorexia or one explained weight loss. #2 headache Apparently this is chronic in nature and has not changed. I reassured the patient that there is no evidence to suggest cancer in his brain from his recent MRI. I recommend over-the-counter analgesics as  needed.  #3 anemia This is likely anemia chronic disease. The patient is asymptomatic. There is no evidence of bleeding. We will observe only.  All questions were answered. The patient knows to call the clinic with any problems, questions or concerns. We can certainly see the patient much sooner if necessary. No barriers to learning was detected. I spent 15 minutes counseling the patient face to  face. The total time spent in the appointment was 20 minutes and more than 50% was on counseling and review of test results     [facility], Gerald Meddaugh, MD 01/02/2013 3:57 PM

## 2013-01-02 NOTE — Telephone Encounter (Signed)
Gave pt appt for lab and MD  for April 2015 °

## 2013-01-09 ENCOUNTER — Encounter: Payer: Self-pay | Admitting: Cardiovascular Disease

## 2013-01-09 ENCOUNTER — Ambulatory Visit (INDEPENDENT_AMBULATORY_CARE_PROVIDER_SITE_OTHER): Payer: Medicare Other | Admitting: Cardiovascular Disease

## 2013-01-09 VITALS — BP 118/80 | HR 47 | Ht 68.0 in | Wt 161.4 lb

## 2013-01-09 DIAGNOSIS — I4891 Unspecified atrial fibrillation: Secondary | ICD-10-CM

## 2013-01-09 DIAGNOSIS — Z7901 Long term (current) use of anticoagulants: Secondary | ICD-10-CM

## 2013-01-09 DIAGNOSIS — E785 Hyperlipidemia, unspecified: Secondary | ICD-10-CM

## 2013-01-09 NOTE — Progress Notes (Signed)
Patient ID: Gerald [name], male   DOB: [birth year], 72 y.o.   MRN: [id number]     HPI: Gerald [name] is a 72 y.o. male who presents for followup cardiology evaluation. I last saw him in November 2013.  Gerald [name] is a 72 year old [place] gentleman who has a history of paroxysmal atrial fibrillation, nonobstructive coronary artery disease, GERD, as well as small cell carcinoma as long. Between November 2012 in March 2013 he completed 6 cycles of carboplatin, etoposide. He is status post TEE guided cardioversion in 2011. Initially he was treated with Coumadin therapy. He develop recurrent atrial fibrillation in November 2012 and since that time has been on Zaroxolyn therapy.  Over the past year, he is unaware of any breakthrough arrhythmias. He denies any episodes of chest pain. He tells me he recently underwent an oncologic reevaluation and was told that he is cancer free. He will be going to [place] from November through April. He presents for one-year evaluation.  Past Medical History  Diagnosis Date  . A-fib   . Hyperlipidemia   . Small cell lung cancer 12/2010  . Smoking   . CAD (coronary artery disease)     last cath 2008 with noncritical CAD  . Allergy     seasonal allergies  . GERD (gastroesophageal reflux disease)   . Headache 12/17/2012    Past Surgical History  Procedure Laterality Date  . Catherization  2008    cardiac catherization    No Known Allergies  Current Outpatient Prescriptions  Medication Sig Dispense Refill  . aspirin 81 MG tablet Take 81 mg by mouth daily.      . Cholecalciferol (VITAMIN D-3) 5000 UNITS TABS Take 1 tablet by mouth daily.      . digoxin (LANOXIN) 0.125 MG tablet Take 0.125 mg by mouth daily.      . metoprolol tartrate (LOPRESSOR) 25 MG tablet Take 1.5 tablets (37.5 mg total) by mouth 2 (two) times daily.  90 tablet  2  . Rivaroxaban (XARELTO) 20 MG TABS Take 20 mg by mouth daily.       . simvastatin (ZOCOR) 40 MG tablet Take 40 mg by mouth at  bedtime.         No current facility-administered medications for this visit.    History   Social History  . Marital Status: Married    Spouse Name: N/A    Number of Children: N/A  . Years of Education: N/A   Occupational History  . Not on file.   Social History Main Topics  . Smoking status: Former Smoker -- 1.00 packs/day for 50 years    Types: Cigarettes    Quit date: 03/28/2006  . Smokeless tobacco: Not on file  . Alcohol Use: No  . Drug Use: No  . Sexual Activity: Not on file   Other Topics Concern  . Not on file   Social History Narrative  . No narrative on file    History reviewed. No pertinent family history.  Socially he is married has 6 children and 9 grandchildren. Has no recent tobacco use or alcohol use.  ROS is negative for fevers, chills or night sweats.   He denies recent cough. He denies sputum production. He is unaware of recurrent tachypalpitations. There is no presyncope or syncope. There is no chest pain. He denies bleeding. He denies claudication. Denies change in bowel bladder habits. There is no rash. He denies paresthesias. Other comprehensive 12 point system review is negative.  PE BP 118/80  Pulse  47  Ht 5\' 8"  (1.727 m)  Wt 161 lb 6.4 oz (73.211 kg)  BMI 24.55 kg/m2  General: Alert, oriented, no distress.  Skin: normal turgor, no rashes HEENT: Normocephalic, atraumatic. Pupils round and reactive; sclera anicteric;no lid lag.  Nose without nasal septal hypertrophy Mouth/Parynx benign; Mallinpatti scale 2 Neck: No JVD, no carotid briuts Lungs: clear to ausculatation and percussion; no wheezing or rales Heart: Regular but bradycardic rhythm at 47, s1 s2 normal 1/6 systolic murmur Abdomen: soft, nontender; no hepatosplenomehaly, BS+; abdominal aorta nontender and not dilated by palpation. Pulses 2+ Extremities: no clubbing cyanosis or edema, Homan's sign negative  Neurologic: grossly nonfocal Psychologic: normal affect and mood.  ECG:  Sinus bradycardia at 47 with mild sinus arrhythmia. QTc interval 403 ms.  LABS:  BMET    Component Value Date/Time   NA 141 12/17/2012 1439   NA 139 09/12/2011 1010   NA 142 06/20/2011 1158   K 3.7 12/17/2012 1439   K 3.7 09/12/2011 1010   K 3.9 06/20/2011 1158   CL 108* 08/14/2012 0958   CL 107 09/12/2011 1010   CL 102 06/20/2011 1158   CO2 24 12/17/2012 1439   CO2 26 09/12/2011 1010   CO2 29 06/20/2011 1158   GLUCOSE 99 12/17/2012 1439   GLUCOSE 101* 08/14/2012 0958   GLUCOSE 100* 09/12/2011 1010   GLUCOSE 99 06/20/2011 1158   BUN 15.8 12/17/2012 1439   BUN 20 09/12/2011 1010   BUN 16 06/20/2011 1158   CREATININE 1.1 12/17/2012 1439   CREATININE 1.18 09/12/2011 1010   CREATININE 1.0 06/20/2011 1158   CALCIUM 8.8 12/17/2012 1439   CALCIUM 8.8 09/12/2011 1010   CALCIUM 8.4 06/20/2011 1158   GFRNONAA 81* 01/24/2011 0155   GFRAA >90 01/24/2011 0155     Hepatic Function Panel     Component Value Date/Time   PROT 6.9 12/17/2012 1439   PROT 6.6 09/12/2011 1010   PROT 7.7 06/20/2011 1158   ALBUMIN 3.5 12/17/2012 1439   ALBUMIN 3.8 09/12/2011 1010   AST 19 12/17/2012 1439   AST 25 09/12/2011 1010   AST 25 06/20/2011 1158   ALT 17 12/17/2012 1439   ALT 28 09/12/2011 1010   ALT 28 06/20/2011 1158   ALKPHOS 62 12/17/2012 1439   ALKPHOS 67 09/12/2011 1010   ALKPHOS 95* 06/20/2011 1158   BILITOT 0.56 12/17/2012 1439   BILITOT 0.3 09/12/2011 1010   BILITOT 0.50 06/20/2011 1158     CBC    Component Value Date/Time   WBC 3.5* 12/17/2012 1439   WBC 7.3 01/26/2011 0515   RBC 3.87* 12/17/2012 1439   RBC 3.79* 01/26/2011 0515   HGB 12.8* 12/17/2012 1439   HGB 12.0* 01/26/2011 0515   HCT 37.4* 12/17/2012 1439   HCT 35.0* 01/26/2011 0515   PLT 113* 12/17/2012 1439   PLT 164 01/26/2011 0515   MCV 96.8 12/17/2012 1439   MCV 92.3 01/26/2011 0515   MCH 33.0 12/17/2012 1439   MCH 31.7 01/26/2011 0515   MCHC 34.1 12/17/2012 1439   MCHC 34.3 01/26/2011 0515   RDW 14.0 12/17/2012 1439   RDW 13.4 01/26/2011 0515    LYMPHSABS 1.9 12/17/2012 1439   LYMPHSABS 1.4 01/24/2011 0155   MONOABS 0.4 12/17/2012 1439   MONOABS 1.0 01/24/2011 0155   EOSABS 0.2 12/17/2012 1439   EOSABS 0.3 01/24/2011 0155   BASOSABS 0.0 12/17/2012 1439   BASOSABS 0.0 01/24/2011 0155     BNP No results found for this basename: probnp  Lipid Panel     Component Value Date/Time   CHOL  Value: 121        ATP III CLASSIFICATION:  <200     mg/dL   Desirable  161-096  mg/dL   Borderline High  >=045    mg/dL   High        07/04/8117 0445   TRIG 29 09/11/2009 0445   HDL 41 09/11/2009 0445   CHOLHDL 3.0 09/11/2009 0445   VLDL 6 09/11/2009 0445   LDLCALC  Value: 74        Total Cholesterol/HDL:CHD Risk Coronary Heart Disease Risk Table                     Men   Women  1/2 Average Risk   3.4   3.3  Average Risk       5.0   4.4  2 X Average Risk   9.6   7.1  3 X Average Risk  23.4   11.0        Use the calculated Patient Ratio above and the CHD Risk Table to determine the patient's CHD Risk.        ATP III CLASSIFICATION (LDL):  <100     mg/dL   Optimal  147-829  mg/dL   Near or Above                    Optimal  130-159  mg/dL   Borderline  562-130  mg/dL   High  >865     mg/dL   Very High 7/84/6962 9528     RADIOLOGY: Dg Chest 2 View  12/17/2012   CLINICAL DATA:  History of small cell lung cancer.  EXAM: CHEST  2 VIEW  COMPARISON:  Chest CT Aug 14, 2012, chest x-ray February 28, 2013  FINDINGS: There is a conglomerate of partial calcified mass in the right apex measuring 1.7 x 2.5 cm. This is not significantly changed in appearance compared to prior chest CT of 2014 coronal images. There is a 6 mm nodule in the lateral right lung base which is not significantly changed compared a prior CT of Aug 14, 2012. There is no focal pneumonia, pulmonary edema, or pleural effusion. Minimal atelectasis of the left lung base is identified. The aorta is tortuous. The heart size is normal. The soft tissues and osseous structures are stable.  IMPRESSION:  Conglomerate partial calcified masslike area in the right apex and 6 mm nodule in the lateral right lung base unchanged compared prior CT of May 2014.   Electronically Signed   By: Sherian Rein   On: 12/17/2012 14:43   Ct Chest W Contrast  12/31/2012   CLINICAL DATA:  Follow-up small cell lung cancer  EXAM: CT CHEST, ABDOMEN, AND PELVIS WITH CONTRAST  TECHNIQUE: Multidetector CT imaging of the chest, abdomen and pelvis was performed following the standard protocol during bolus administration of intravenous contrast.  CONTRAST:  OMNIPAQUE IOHEXOL 300 MG/ML  SOLN  COMPARISON:  12/19/2011  FINDINGS: CT CHEST FINDINGS  Stable calcified nodularity at the right lung apex (series 4/ image 13) and lateral right middle lobe (series 4/ image 13).  No new/suspicious pulmonary nodules. Mild to moderate paraseptal emphysematous changes with bullous changes at the right lung apex (series 4/ image 10). No pleural effusion or pneumothorax.  Visualized thyroid is unremarkable.  Mild cardiomegaly. No pericardial effusion. Coronary atherosclerosis. Atherosclerotic calcifications of the aortic arch. Ectasia of the ascending thoracic aorta  measuring up to 3.8 cm.  11 mm short axis right hilar node (series 2/ image 24), unchanged. No suspicious mediastinal or axillary lymphadenopathy.  Very mild degenerative changes of the thoracic spine.  CT ABDOMEN AND PELVIS FINDINGS  Liver, spleen, pancreas, and adrenal glands are within normal limits.  Gallbladder is unremarkable. No intrahepatic or extrahepatic ductal dilatation.  Kidneys are within normal limits. No hydronephrosis.  No evidence of bowel obstruction.  Atherosclerotic calcifications of the abdominal aorta and branch vessels.  No abdominopelvic ascites.  No suspicious abdominopelvic lymphadenopathy.  Prostate is unremarkable.  Bladder is within normal limits.  Mild degenerative changes of the lumbar spine.  IMPRESSION: CT CHEST IMPRESSION  No evidence of recurrent or  metastatic disease in the chest.  11 mm short axis right hilar node, unchanged.  CT ABDOMEN AND PELVIS IMPRESSION  No evidence of metastatic disease in the abdomen/pelvis.   Electronically Signed   By: Charline Bills M.D.   On: 12/31/2012 14:27   Mr Laqueta [name] ZO Contrast  12/31/2012   CLINICAL DATA:  72 year old male with new headache. Initial encounter. History of lung cancer. Restaging.  EXAM: MRI HEAD WITHOUT AND WITH CONTRAST  TECHNIQUE: Multiplanar, multiecho pulse sequences of the brain and surrounding structures were obtained according to standard protocol without and with intravenous contrast  CONTRAST:  15mL MULTIHANCE GADOBENATE DIMEGLUMINE 529 MG/ML IV SOLN  COMPARISON:  Brain MRI 02/04/2011.  FINDINGS: No abnormal enhancement identified. No midline shift, mass effect, or evidence of intracranial mass lesion.  Stable cerebral volume. No restricted diffusion to suggest acute infarction. No ventriculomegaly, extra-axial collection or acute intracranial hemorrhage. Cervicomedullary junction and pituitary are within normal limits. Negative visualized cervical spine. Major intracranial vascular flow voids are stable. Wallace Cullens and white matter signal appear stable, with scattered small nonspecific foci of white matter T2 and FLAIR hyperintensity, mild for age. No cortical encephalomalacia.  Visualized orbit soft tissues are within normal limits. Visualized paranasal sinuses and mastoids are clear. Visualized scalp soft tissues are within normal limits. Normal bone marrow signal.  IMPRESSION: No acute or metastatic intracranial abnormality. Stable mild for age nonspecific white matter signal changes.   Electronically Signed   By: Augusto Gamble M.D.   On: 12/31/2012 16:55   Ct Abdomen Pelvis W Contrast  12/31/2012   CLINICAL DATA:  Follow-up small cell lung cancer  EXAM: CT CHEST, ABDOMEN, AND PELVIS WITH CONTRAST  TECHNIQUE: Multidetector CT imaging of the chest, abdomen and pelvis was performed following the  standard protocol during bolus administration of intravenous contrast.  CONTRAST:  OMNIPAQUE IOHEXOL 300 MG/ML  SOLN  COMPARISON:  12/19/2011  FINDINGS: CT CHEST FINDINGS  Stable calcified nodularity at the right lung apex (series 4/ image 13) and lateral right middle lobe (series 4/ image 13).  No new/suspicious pulmonary nodules. Mild to moderate paraseptal emphysematous changes with bullous changes at the right lung apex (series 4/ image 10). No pleural effusion or pneumothorax.  Visualized thyroid is unremarkable.  Mild cardiomegaly. No pericardial effusion. Coronary atherosclerosis. Atherosclerotic calcifications of the aortic arch. Ectasia of the ascending thoracic aorta measuring up to 3.8 cm.  11 mm short axis right hilar node (series 2/ image 24), unchanged. No suspicious mediastinal or axillary lymphadenopathy.  Very mild degenerative changes of the thoracic spine.  CT ABDOMEN AND PELVIS FINDINGS  Liver, spleen, pancreas, and adrenal glands are within normal limits.  Gallbladder is unremarkable. No intrahepatic or extrahepatic ductal dilatation.  Kidneys are within normal limits. No hydronephrosis.  No evidence  of bowel obstruction.  Atherosclerotic calcifications of the abdominal aorta and branch vessels.  No abdominopelvic ascites.  No suspicious abdominopelvic lymphadenopathy.  Prostate is unremarkable.  Bladder is within normal limits.  Mild degenerative changes of the lumbar spine.  IMPRESSION: CT CHEST IMPRESSION  No evidence of recurrent or metastatic disease in the chest.  11 mm short axis right hilar node, unchanged.  CT ABDOMEN AND PELVIS IMPRESSION  No evidence of metastatic disease in the abdomen/pelvis.   Electronically Signed   By: Charline Bills M.D.   On: 12/31/2012 14:27      ASSESSMENT AND PLAN:  GeraldAlverio Has a history of paroxysmal atrial fibrillation. He is currently maintaining sinus rhythm but is bradycardic at 47 beats per minute. An echo Doppler study in October  2012 showed an ejection fraction of 65-70%. He is on Zaroxolyn for anticoagulation. Presently, on a recommending that we wean and multiple he discontinue his Lanoxin therapy. Currently is getting 0.125 mg daily. He will reduce this 2.0 65 mg for one week and then discontinue. He'll continue his current dose of metoprolol tartrate at 37.5 mg twice a day as well as his or although. He is on simvastatin 40 mg for his lipid lowering therapy. He will be going to be a nominal. He is followed by the oncology for his small cell CA. I will see him in one year for cardiology reevaluation.     Lennette Bihari, MD, [facility]  01/09/2013 5:23 PM

## 2013-01-09 NOTE — Patient Instructions (Signed)
Your physician has recommended you make the following change in your medication: take 1/2 tablet of the digoxin for 1 week then STOP!  Your physician recommends that you schedule a follow-up appointment in: 1 YEAR.

## 2013-01-10 ENCOUNTER — Encounter: Payer: Self-pay | Admitting: Cardiovascular Disease

## 2013-01-28 ENCOUNTER — Other Ambulatory Visit: Payer: Self-pay | Admitting: Cardiovascular Disease

## 2013-01-31 ENCOUNTER — Other Ambulatory Visit: Payer: Self-pay

## 2013-03-05 ENCOUNTER — Other Ambulatory Visit: Payer: Self-pay | Admitting: Cardiovascular Disease

## 2013-03-05 NOTE — Telephone Encounter (Signed)
Rx was sent to pharmacy electronically. 

## 2013-04-09 ENCOUNTER — Telehealth: Payer: Self-pay | Admitting: Cardiovascular Disease

## 2013-04-09 ENCOUNTER — Other Ambulatory Visit (HOSPITAL_BASED_OUTPATIENT_CLINIC_OR_DEPARTMENT_OTHER): Payer: Medicare Other

## 2013-04-09 ENCOUNTER — Telehealth: Payer: Self-pay | Admitting: *Deleted

## 2013-04-09 ENCOUNTER — Encounter: Payer: Self-pay | Admitting: Hematology and Oncology

## 2013-04-09 ENCOUNTER — Telehealth: Payer: Self-pay | Admitting: Hematology and Oncology

## 2013-04-09 ENCOUNTER — Ambulatory Visit (HOSPITAL_BASED_OUTPATIENT_CLINIC_OR_DEPARTMENT_OTHER): Payer: Medicare Other | Admitting: Hematology and Oncology

## 2013-04-09 VITALS — BP 119/69 | HR 65 | Temp 97.5°F | Resp 18 | Ht 68.0 in | Wt 163.8 lb

## 2013-04-09 DIAGNOSIS — D61818 Other pancytopenia: Secondary | ICD-10-CM

## 2013-04-09 DIAGNOSIS — C3490 Malignant neoplasm of unspecified part of unspecified bronchus or lung: Secondary | ICD-10-CM

## 2013-04-09 DIAGNOSIS — C342 Malignant neoplasm of middle lobe, bronchus or lung: Secondary | ICD-10-CM

## 2013-04-09 DIAGNOSIS — D649 Anemia, unspecified: Secondary | ICD-10-CM

## 2013-04-09 HISTORY — DX: Other pancytopenia: D61.818

## 2013-04-09 LAB — COMPREHENSIVE METABOLIC PANEL (CC13)
ALT: 10 U/L (ref 0–55)
AST: 14 U/L (ref 5–34)
Albumin: 3.6 g/dL (ref 3.5–5.0)
Alkaline Phosphatase: 61 U/L (ref 40–150)
Anion Gap: 9 mEq/L (ref 3–11)
BUN: 18.3 mg/dL (ref 7.0–26.0)
CO2: 25 mEq/L (ref 22–29)
Calcium: 8.6 mg/dL (ref 8.4–10.4)
Chloride: 107 mEq/L (ref 98–109)
Creatinine: 1.2 mg/dL (ref 0.7–1.3)
Glucose: 136 mg/dl (ref 70–140)
Potassium: 3.9 mEq/L (ref 3.5–5.1)
Sodium: 141 mEq/L (ref 136–145)
Total Bilirubin: 0.4 mg/dL (ref 0.20–1.20)
Total Protein: 6.7 g/dL (ref 6.4–8.3)

## 2013-04-09 LAB — CBC WITH DIFFERENTIAL/PLATELET
BASO%: 0 % (ref 0.0–2.0)
Basophils Absolute: 0 10*3/uL (ref 0.0–0.1)
EOS%: 0.4 % (ref 0.0–7.0)
Eosinophils Absolute: 0 10*3/uL (ref 0.0–0.5)
HCT: 22.5 % — ABNORMAL LOW (ref 38.4–49.9)
HGB: 7.9 g/dL — ABNORMAL LOW (ref 13.0–17.1)
LYMPH%: 68.1 % — ABNORMAL HIGH (ref 14.0–49.0)
MCH: 36.6 pg — ABNORMAL HIGH (ref 27.2–33.4)
MCHC: 35.1 g/dL (ref 32.0–36.0)
MCV: 104.2 fL — ABNORMAL HIGH (ref 79.3–98.0)
MONO#: 0.3 10*3/uL (ref 0.1–0.9)
MONO%: 12.7 % (ref 0.0–14.0)
NEUT#: 0.4 10*3/uL — CL (ref 1.5–6.5)
NEUT%: 18.8 % — ABNORMAL LOW (ref 39.0–75.0)
Platelets: 40 10*3/uL — ABNORMAL LOW (ref 140–400)
RBC: 2.16 10*6/uL — ABNORMAL LOW (ref 4.20–5.82)
RDW: 15.7 % — ABNORMAL HIGH (ref 11.0–14.6)
WBC: 2.3 10*3/uL — ABNORMAL LOW (ref 4.0–10.3)
lymph#: 1.6 10*3/uL (ref 0.9–3.3)
nRBC: 0 % (ref 0–0)

## 2013-04-09 LAB — LACTATE DEHYDROGENASE (CC13): LDH: 228 U/L (ref 125–245)

## 2013-04-09 LAB — TECHNOLOGIST REVIEW: Technologist Review: 14

## 2013-04-09 MED ORDER — CIPROFLOXACIN HCL 250 MG PO TABS: 250.0000 mg | ORAL_TABLET | Freq: Two times a day (BID) | ORAL | Status: DC

## 2013-04-09 NOTE — Progress Notes (Signed)
[facility] OFFICE PROGRESS NOTE  Patient Care Team: Gara Kroner, MD as PCP - General (Family Medicine) Troy Sine, MD as Attending Physician (Cardiology) Thea Silversmith, MD (Radiation Oncology) Nobie Putnam, MD (Hematology and Oncology)  DIAGNOSIS: Locally advanced, extensive stage small cell lung cancer  SUMMARY OF ONCOLOGIC HISTORY: This is a pleasant 73 your gentleman was diagnosed to have locally advanced, extensive stage small cell lung cancer. The patient received 6 cycles of carboplatin and etoposide between November 2012 to March 2013. The patient declined prophylactic cranial irradiation. CT scan of the chest, abdomen and pelvis and MRI of the head from October 2014 were within normal limits  INTERVAL HISTORY: Gerald [name] 73 y.o. male returns for further followup. History very well. He denies any recent headache. He denies any recent fever, chills, night sweats or abnormal weight loss The patient denies any recent signs or symptoms of bleeding such as spontaneous epistaxis, hematuria or hematochezia.  I have reviewed the past medical history, past surgical history, social history and family history with the patient and they are unchanged from previous note.  ALLERGIES:  has No Known Allergies.  MEDICATIONS:  Current Outpatient Prescriptions  Medication Sig Dispense Refill  . aspirin 81 MG tablet Take 81 mg by mouth daily.      . Cholecalciferol (VITAMIN D-3) 5000 UNITS TABS Take 1 tablet by mouth daily.      . ciprofloxacin (CIPRO) 250 MG tablet Take 1 tablet (250 mg total) by mouth 2 (two) times daily.  60 tablet  1  . digoxin (LANOXIN) 0.125 MG tablet Take 0.125 mg by mouth daily.      . metoprolol tartrate (LOPRESSOR) 25 MG tablet TAKE ONE AND ONE-HALF TABLETS BY MOUTH TWICE DAILY   90 tablet  6  . Rivaroxaban (XARELTO) 20 MG TABS tablet Take 1 tablet (20 mg total) by mouth daily with supper.  30 tablet  5  . simvastatin (ZOCOR) 40 MG tablet Take 40 mg  by mouth at bedtime.         No current facility-administered medications for this visit.    REVIEW OF SYSTEMS:   Constitutional: Denies fevers, chills or abnormal weight loss Eyes: Denies blurriness of vision Ears, nose, mouth, throat, and face: Denies mucositis or sore throat Respiratory: Denies cough, dyspnea or wheezes Cardiovascular: Denies palpitation, chest discomfort or lower extremity swelling Gastrointestinal:  Denies nausea, heartburn or change in bowel habits Skin: Denies abnormal skin rashes Lymphatics: Denies new lymphadenopathy or easy bruising Neurological:Denies numbness, tingling or new weaknesses Behavioral/Psych: Mood is stable, no new changes  All other systems were reviewed with the patient and are negative.  PHYSICAL EXAMINATION: ECOG PERFORMANCE STATUS: 0 - Asymptomatic  Filed Vitals:   04/09/13 1429  BP: 119/69  Pulse: 65  Temp: 97.5 F (36.4 C)  Resp: 18   Filed Weights   04/09/13 1429  Weight: 163 lb 12.8 oz (74.299 kg)    GENERAL:alert, no distress and comfortable SKIN: skin color, texture, turgor are normal, no rashes or significant lesions EYES: normal, Conjunctiva are pink and non-injected, sclera clear OROPHARYNX:no exudate, no erythema and lips, buccal mucosa, and tongue normal  NECK: supple, thyroid normal size, non-tender, without nodularity LYMPH:  no palpable lymphadenopathy in the cervical, axillary or inguinal LUNGS: clear to auscultation and percussion with normal breathing effort HEART: regular rate & rhythm and no murmurs and no lower extremity edema ABDOMEN:abdomen soft, non-tender and normal bowel sounds Musculoskeletal:no cyanosis of digits and no clubbing  NEURO: alert & oriented x 3 with fluent speech, no focal motor/sensory deficits  LABORATORY DATA:  I have reviewed the data as listed    Component Value Date/Time   NA 141 04/09/2013 1413   NA 139 09/12/2011 1010   NA 142 06/20/2011 1158   K 3.9 04/09/2013 1413   K 3.7  09/12/2011 1010   K 3.9 06/20/2011 1158   CL 108* 08/14/2012 0958   CL 107 09/12/2011 1010   CL 102 06/20/2011 1158   CO2 25 04/09/2013 1413   CO2 26 09/12/2011 1010   CO2 29 06/20/2011 1158   GLUCOSE 136 04/09/2013 1413   GLUCOSE 101* 08/14/2012 0958   GLUCOSE 100* 09/12/2011 1010   GLUCOSE 99 06/20/2011 1158   BUN 18.3 04/09/2013 1413   BUN 20 09/12/2011 1010   BUN 16 06/20/2011 1158   CREATININE 1.2 04/09/2013 1413   CREATININE 1.18 09/12/2011 1010   CREATININE 1.0 06/20/2011 1158   CALCIUM 8.6 04/09/2013 1413   CALCIUM 8.8 09/12/2011 1010   CALCIUM 8.4 06/20/2011 1158   PROT 6.7 04/09/2013 1413   PROT 6.6 09/12/2011 1010   PROT 7.7 06/20/2011 1158   ALBUMIN 3.6 04/09/2013 1413   ALBUMIN 3.8 09/12/2011 1010   AST 14 04/09/2013 1413   AST 25 09/12/2011 1010   AST 25 06/20/2011 1158   ALT 10 04/09/2013 1413   ALT 28 09/12/2011 1010   ALT 28 06/20/2011 1158   ALKPHOS 61 04/09/2013 1413   ALKPHOS 67 09/12/2011 1010   ALKPHOS 95* 06/20/2011 1158   BILITOT 0.40 04/09/2013 1413   BILITOT 0.3 09/12/2011 1010   BILITOT 0.50 06/20/2011 1158   GFRNONAA 81* 01/24/2011 0155   GFRAA >90 01/24/2011 0155    No results found for this basename: SPEP, UPEP,  kappa and lambda light chains    Lab Results  Component Value Date   WBC 2.3* 04/09/2013   NEUTROABS 0.4* 04/09/2013   HGB 7.9* 04/09/2013   HCT 22.5* 04/09/2013   MCV 104.2* 04/09/2013   PLT 40* 04/09/2013      Chemistry      Component Value Date/Time   NA 141 04/09/2013 1413   NA 139 09/12/2011 1010   NA 142 06/20/2011 1158   K 3.9 04/09/2013 1413   K 3.7 09/12/2011 1010   K 3.9 06/20/2011 1158   CL 108* 08/14/2012 0958   CL 107 09/12/2011 1010   CL 102 06/20/2011 1158   CO2 25 04/09/2013 1413   CO2 26 09/12/2011 1010   CO2 29 06/20/2011 1158   BUN 18.3 04/09/2013 1413   BUN 20 09/12/2011 1010   BUN 16 06/20/2011 1158   CREATININE 1.2 04/09/2013 1413   CREATININE 1.18 09/12/2011 1010   CREATININE 1.0 06/20/2011 1158      Component Value Date/Time   CALCIUM 8.6  04/09/2013 1413   CALCIUM 8.8 09/12/2011 1010   CALCIUM 8.4 06/20/2011 1158   ALKPHOS 61 04/09/2013 1413   ALKPHOS 67 09/12/2011 1010   ALKPHOS 95* 06/20/2011 1158   AST 14 04/09/2013 1413   AST 25 09/12/2011 1010   AST 25 06/20/2011 1158   ALT 10 04/09/2013 1413   ALT 28 09/12/2011 1010   ALT 28 06/20/2011 1158   BILITOT 0.40 04/09/2013 1413   BILITOT 0.3 09/12/2011 1010   BILITOT 0.50 06/20/2011 1158     I reviewed his peripheral smear. Peripheral blasts were seen.  ASSESSMENT & PLAN:  #1 lung cancer I am very concerned about the new  onset of severe pancytopenia. 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 I will go ahead and order a PET CT scan for staging as soon as possible.     #2 severe pancytopenia I gave him prescription of ciprofloxacin due to severe neutropenia. For his anemia, I will bring him back next week and transfuse if hemoglobin drops further or if patient becomes symptomatic For his severe thrombocytopenia I told the son to discontinue Xarelto and aspirin and monitor for signs of bleeding. Orders Placed This Encounter  Procedures  . NM PET Image Restag (PS) Skull Base To Thigh    Standing Status: Future     Number of Occurrences:      Standing Expiration Date: 06/09/2014    Order Specific Question:  Reason for Exam (SYMPTOM  OR DIAGNOSIS REQUIRED)    Answer:  staging lung cancer suspect recurrence    Order Specific Question:  Preferred imaging location?    Answer:  [facility]  . CBC & Diff and Retic    Standing Status: Future     Number of Occurrences:      Standing Expiration Date: 04/09/2014  . Vitamin B12    Standing Status: Future     Number of Occurrences:      Standing Expiration Date: 04/09/2014  . Ferritin    Standing Status: Future     Number of  Occurrences:      Standing Expiration Date: 04/09/2014  . Iron and TIBC    Standing Status: Future     Number of Occurrences:      Standing Expiration Date: 04/09/2014  . Hold Tube, Blood Bank    Standing Status: Future     Number of Occurrences:      Standing Expiration Date: 04/09/2014   All questions were answered. The patient knows to call the clinic with any problems, questions or concerns. No barriers to learning was detected.    [facility], [place], MD 04/09/2013 3:11 PM

## 2013-04-09 NOTE — Telephone Encounter (Signed)
Pt went to [facility]  Today and his cancer is back. The doctor wants him to stop taking his [place] Al[place] and his aspirin. What can he do now to prevent having a stroke?

## 2013-04-09 NOTE — Telephone Encounter (Signed)
gave pt appt for lab and MD for January 2015

## 2013-04-09 NOTE — Telephone Encounter (Signed)
Daughter called requesting to speak with Dr. Alvy Bimler.

## 2013-04-10 ENCOUNTER — Telehealth: Payer: Self-pay | Admitting: *Deleted

## 2013-04-10 NOTE — Telephone Encounter (Signed)
Will discuss with Dr Corky Downs And contact patient family

## 2013-04-10 NOTE — Telephone Encounter (Signed)
I reviewed the results of blood work with his daughter. I spoke with her the reason for the PET scan is to rule out recurrence of his lung cancer. 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 If PET/CT scan showed no evidence of lung cancer recurrence, I plan to do bone marrow biopsy next week when I see the patient. I explained to the daughter the rationale for holding his blood thinner. The daughter is concerned about risk of stroke. Xarelto has no antidote. With his low platelet count, I really recommend against restarting him on antiplatelet agents or anticoagulation therapy. She expressed understanding.

## 2013-04-10 NOTE — Telephone Encounter (Signed)
Call from Radiology scheduler,  PET scheduled for 04/15/13 and daughter would like done sooner,  At [facility] or other hospital if needed.   Per Dr. Jacklynn Lewis,  PET on 1/19 at [facility] is ok and no need to get at another hospital sooner.  Dr. Alvy Bimler plans on calling daughter later today.

## 2013-04-11 ENCOUNTER — Telehealth: Payer: Self-pay | Admitting: Cardiovascular Disease

## 2013-04-11 ENCOUNTER — Telehealth: Payer: Self-pay | Admitting: *Deleted

## 2013-04-11 ENCOUNTER — Other Ambulatory Visit: Payer: Self-pay | Admitting: Hematology and Oncology

## 2013-04-11 NOTE — Telephone Encounter (Signed)
Returned call and pt verified x 2 w/ pt's daughter, [place].  Informed message received and advice as stated below.  Informed daughter RN will call if she has the contact info for oncologist.  Daughter stated doctor's name is Alvy Bimler at [facility].  Call to [facility] and left message w/ Dr. Calton Dach nurse with question from Dr. Claiborne Billings and to call back today before 4pm.

## 2013-04-11 NOTE — Telephone Encounter (Signed)
Call to daughter and left message that ASA okay per Tammy at Dr. Calton Dach office and to call them w/ questions at [phone] or our office at [phone] before 4pm.

## 2013-04-11 NOTE — Telephone Encounter (Signed)
Spoke with Dr Evette Georges nurse- Dr Claiborne Billings wanted to know if it is OK for patient to take ASA. It is OK per Dr Alvy Bimler. Family notified by Dr Evette Georges office and then called Dr Alvy Bimler office to confirm.

## 2013-04-11 NOTE — Telephone Encounter (Signed)
Returning [place] call

## 2013-04-11 NOTE — Telephone Encounter (Signed)
The doctor said it was okay to take his 81 mg of aspirin.

## 2013-04-11 NOTE — Telephone Encounter (Signed)
Per Erasmo Downer, PharmD, Dr. Claiborne Billings wanted to know if pt can take ASA 81 mg.  If not, then pt will have to follow instructions by oncologist.  Returned call.  Left message to call back before 4pm.

## 2013-04-11 NOTE — Telephone Encounter (Signed)
Per staff message from MD I have scheduled appt

## 2013-04-12 ENCOUNTER — Telehealth: Payer: Self-pay | Admitting: *Deleted

## 2013-04-12 NOTE — Telephone Encounter (Signed)
Per NIG pt is already aware...td

## 2013-04-15 ENCOUNTER — Ambulatory Visit (HOSPITAL_COMMUNITY)
Admission: RE | Admit: 2013-04-15 | Discharge: 2013-04-15 | Disposition: A | Discharge status: Home / Self Care | Payer: Medicare Other | Source: Ambulatory Visit | Attending: Hematology and Oncology | Admitting: Hematology and Oncology

## 2013-04-15 DIAGNOSIS — I7090 Unspecified atherosclerosis: Secondary | ICD-10-CM | POA: Insufficient documentation

## 2013-04-15 DIAGNOSIS — C3490 Malignant neoplasm of unspecified part of unspecified bronchus or lung: Secondary | ICD-10-CM

## 2013-04-15 DIAGNOSIS — I700 Atherosclerosis of aorta: Secondary | ICD-10-CM | POA: Insufficient documentation

## 2013-04-15 DIAGNOSIS — D61818 Other pancytopenia: Secondary | ICD-10-CM

## 2013-04-15 DIAGNOSIS — J439 Emphysema, unspecified: Secondary | ICD-10-CM | POA: Insufficient documentation

## 2013-04-15 DIAGNOSIS — I2510 Atherosclerotic heart disease of native coronary artery without angina pectoris: Secondary | ICD-10-CM | POA: Insufficient documentation

## 2013-04-15 DIAGNOSIS — J984 Other disorders of lung: Secondary | ICD-10-CM | POA: Insufficient documentation

## 2013-04-15 LAB — GLUCOSE, CAPILLARY: Glucose-Capillary: 100 mg/dL — ABNORMAL HIGH (ref 70–99)

## 2013-04-15 MED ORDER — FLUDEOXYGLUCOSE F - 18 (FDG) INJECTION: 16.5000 | Freq: Once | INTRAVENOUS | Status: AC | PRN

## 2013-04-16 ENCOUNTER — Telehealth: Payer: Self-pay | Admitting: Hematology and Oncology

## 2013-04-16 ENCOUNTER — Ambulatory Visit (HOSPITAL_COMMUNITY)
Admission: RE | Admit: 2013-04-16 | Discharge: 2013-04-16 | Disposition: A | Discharge status: Home / Self Care | Payer: Medicare Other | Source: Ambulatory Visit | Attending: Hematology and Oncology | Admitting: Hematology and Oncology

## 2013-04-16 ENCOUNTER — Other Ambulatory Visit (HOSPITAL_COMMUNITY)
Admission: RE | Admit: 2013-04-16 | Discharge: 2013-04-16 | Disposition: A | Discharge status: Home / Self Care | Payer: Medicare Other | Source: Ambulatory Visit | Attending: Hematology and Oncology | Admitting: Hematology and Oncology

## 2013-04-16 ENCOUNTER — Other Ambulatory Visit (HOSPITAL_BASED_OUTPATIENT_CLINIC_OR_DEPARTMENT_OTHER): Payer: Medicare Other

## 2013-04-16 ENCOUNTER — Encounter (HOSPITAL_BASED_OUTPATIENT_CLINIC_OR_DEPARTMENT_OTHER): Payer: Medicare Other | Admitting: Hematology and Oncology

## 2013-04-16 ENCOUNTER — Encounter: Payer: Self-pay | Admitting: Hematology and Oncology

## 2013-04-16 ENCOUNTER — Ambulatory Visit (HOSPITAL_BASED_OUTPATIENT_CLINIC_OR_DEPARTMENT_OTHER): Payer: Medicare Other | Admitting: Hematology and Oncology

## 2013-04-16 ENCOUNTER — Ambulatory Visit: Payer: Medicare Other

## 2013-04-16 VITALS — BP 112/74 | HR 67 | Temp 98.2°F | Resp 20

## 2013-04-16 VITALS — BP 114/66 | HR 65 | Temp 97.7°F | Resp 18 | Ht 68.0 in | Wt 158.9 lb

## 2013-04-16 DIAGNOSIS — D72819 Decreased white blood cell count, unspecified: Secondary | ICD-10-CM

## 2013-04-16 DIAGNOSIS — D649 Anemia, unspecified: Secondary | ICD-10-CM | POA: Insufficient documentation

## 2013-04-16 DIAGNOSIS — C3490 Malignant neoplasm of unspecified part of unspecified bronchus or lung: Secondary | ICD-10-CM

## 2013-04-16 DIAGNOSIS — D61818 Other pancytopenia: Secondary | ICD-10-CM

## 2013-04-16 DIAGNOSIS — D539 Nutritional anemia, unspecified: Secondary | ICD-10-CM | POA: Insufficient documentation

## 2013-04-16 DIAGNOSIS — Z85118 Personal history of other malignant neoplasm of bronchus and lung: Secondary | ICD-10-CM

## 2013-04-16 DIAGNOSIS — D696 Thrombocytopenia, unspecified: Secondary | ICD-10-CM

## 2013-04-16 DIAGNOSIS — C9200 Acute myeloblastic leukemia, not having achieved remission: Secondary | ICD-10-CM | POA: Insufficient documentation

## 2013-04-16 DIAGNOSIS — C342 Malignant neoplasm of middle lobe, bronchus or lung: Secondary | ICD-10-CM

## 2013-04-16 HISTORY — DX: Nutritional anemia, unspecified: D53.9

## 2013-04-16 LAB — CBC & DIFF AND RETIC
BASO%: 0 % (ref 0.0–2.0)
Basophils Absolute: 0 10*3/uL (ref 0.0–0.1)
EOS%: 0 % (ref 0.0–7.0)
Eosinophils Absolute: 0 10*3/uL (ref 0.0–0.5)
HCT: 20.9 % — ABNORMAL LOW (ref 38.4–49.9)
HGB: 7.3 g/dL — ABNORMAL LOW (ref 13.0–17.1)
Immature Retic Fract: 13.8 % — ABNORMAL HIGH (ref 3.00–10.60)
LYMPH%: 62.4 % — ABNORMAL HIGH (ref 14.0–49.0)
MCH: 36.7 pg — ABNORMAL HIGH (ref 27.2–33.4)
MCHC: 34.9 g/dL (ref 32.0–36.0)
MCV: 105 fL — ABNORMAL HIGH (ref 79.3–98.0)
MONO#: 0.2 10*3/uL (ref 0.1–0.9)
MONO%: 9.4 % (ref 0.0–14.0)
NEUT#: 0.6 10*3/uL — ABNORMAL LOW (ref 1.5–6.5)
NEUT%: 28.2 % — ABNORMAL LOW (ref 39.0–75.0)
Platelets: 29 10*3/uL — ABNORMAL LOW (ref 140–400)
RBC: 1.99 10*6/uL — ABNORMAL LOW (ref 4.20–5.82)
RDW: 16.3 % — ABNORMAL HIGH (ref 11.0–14.6)
Retic %: 1.62 % (ref 0.80–1.80)
Retic Ct Abs: 32.24 10*3/uL — ABNORMAL LOW (ref 34.80–93.90)
WBC: 2.1 10*3/uL — ABNORMAL LOW (ref 4.0–10.3)
lymph#: 1.3 10*3/uL (ref 0.9–3.3)
nRBC: 0 % (ref 0–0)

## 2013-04-16 LAB — PREPARE RBC (CROSSMATCH)

## 2013-04-16 LAB — VITAMIN B12: Vitamin B-12: 905 pg/mL (ref 211–911)

## 2013-04-16 LAB — IRON AND TIBC CHCC
%SAT: UNDETERMINED % (ref 20–?)
Iron: 189 ug/dL — ABNORMAL HIGH (ref 42–163)
TIBC: 179 ug/dL — ABNORMAL LOW (ref 202–409)
UIBC: UNDETERMINED ug/dL (ref 117–376)

## 2013-04-16 LAB — FERRITIN CHCC: Ferritin: 695 ng/ml — ABNORMAL HIGH (ref 22–316)

## 2013-04-16 LAB — ABO/RH: ABO/RH(D): O POS

## 2013-04-16 LAB — TECHNOLOGIST REVIEW

## 2013-04-16 LAB — HOLD TUBE, BLOOD BANK

## 2013-04-16 MED ORDER — SODIUM CHLORIDE 0.9 % IV SOLN
250.0000 mL | Freq: Once | INTRAVENOUS | Status: AC
  Administered 2013-04-16: 250 mL via INTRAVENOUS

## 2013-04-16 NOTE — Patient Instructions (Addendum)
Blood Transfusion Information WHAT IS A BLOOD TRANSFUSION? A transfusion is the replacement of blood or some of its parts. Blood is made up of multiple cells which provide different functions.  Red blood cells carry oxygen and are used for blood loss replacement.  White blood cells fight against infection.  Platelets control bleeding.  Plasma helps clot blood.  Other blood products are available for specialized needs, such as hemophilia or other clotting disorders. BEFORE THE TRANSFUSION  Who gives blood for transfusions?   You may be able to donate blood to be used at a later date on yourself (autologous donation).  Relatives can be asked to donate blood. This is generally not any safer than if you have received blood from a stranger. The same precautions are taken to ensure safety when a relative's blood is donated.  Healthy volunteers who are fully evaluated to make sure their blood is safe. This is blood bank blood. Transfusion therapy is the safest it has ever been in the practice of medicine. Before blood is taken from a donor, a complete history is taken to make sure that person has no history of diseases nor engages in risky social behavior (examples are intravenous drug use or sexual activity with multiple partners). The donor's travel history is screened to minimize risk of transmitting infections, such as malaria. The donated blood is tested for signs of infectious diseases, such as HIV and hepatitis. The blood is then tested to be sure it is compatible with you in order to minimize the chance of a transfusion reaction. If you or a relative donates blood, this is often done in anticipation of surgery and is not appropriate for emergency situations. It takes many days to process the donated blood. RISKS AND COMPLICATIONS Although transfusion therapy is very safe and saves many lives, the main dangers of transfusion include:   Getting an infectious disease.  Developing a  transfusion reaction. This is an allergic reaction to something in the blood you were given. Every precaution is taken to prevent this. The decision to have a blood transfusion has been considered carefully by your caregiver before blood is given. Blood is not given unless the benefits outweigh the risks. AFTER THE TRANSFUSION  Right after receiving a blood transfusion, you will usually feel much better and more energetic. This is especially true if your red blood cells have gotten low (anemic). The transfusion raises the level of the red blood cells which carry oxygen, and this usually causes an energy increase.  The nurse administering the transfusion will monitor you carefully for complications. HOME CARE INSTRUCTIONS  No special instructions are needed after a transfusion. You may find your energy is better. Speak with your caregiver about any limitations on activity for underlying diseases you may have. SEEK MEDICAL CARE IF:   Your condition is not improving after your transfusion.  You develop redness or irritation at the intravenous (IV) site. SEEK IMMEDIATE MEDICAL CARE IF:  Any of the following symptoms occur over the next 12 hours:  Shaking chills.  You have a temperature by mouth above 102 F (38.9 C), not controlled by medicine.  Chest, back, or muscle pain.  People around you feel you are not acting correctly or are confused.  Shortness of breath or difficulty breathing.  Dizziness and fainting.  You get a rash or develop hives.  You have a decrease in urine output.  Your urine turns a dark color or changes to pink, red, or brown. Any of the following   symptoms occur over the next 10 days:  You have a temperature by mouth above 102 F (38.9 C), not controlled by medicine.  Shortness of breath.  Weakness after normal activity.  The white part of the eye turns yellow (jaundice).  You have a decrease in the amount of urine or are urinating less often.  Your  urine turns a dark color or changes to pink, red, or brown. Document Released: 03/11/2000 Document Revised: 06/06/2011 Document Reviewed: 10/29/2007 [facility] Patient Information 2014 [place]. Bone Marrow Aspiration and Bone Biopsy Examination of the bone marrow is a valuable test to diagnose blood disorders. A bone marrow biopsy takes a sample of bone and a small amount of fluid and cells from inside the bone. A bone marrow aspiration removes only the marrow. Bone marrow aspiration and bone biopsies are used to stage different disorders of the blood, such as leukemia. Staging will help your caregiver understand how far the disease has progressed.  The tests are also useful in diagnosing:  Fever of unknown origin (FUO).  Bacterial infections and other widespread fungal infections.  Cancers that have spread (metastasized) to the bone marrow.  Diseases that are characterized by a deficiency of an enzyme (storage diseases). This includes:  Niemann-Pick disease.  Gaucher disease. PROCEDURE  Sites used to get samples include:   Back of your hip bone (posterior iliac crest).  Both aspiration and biopsy.  Front of your hip bone (anterior iliac crest).  Both aspiration and biopsy.  Breastbone (sternum).  Aspiration from your breastbone (done only in adults). This method is rarely used. When you get a hip bone aspiration:  You are placed lying on your side with the upper knee brought up and flexed with the lower leg straight.  The site is prepared, cleaned with an antiseptic scrub, and draped. This keeps the biopsy area clean.  The skin and the area down to the lining of the bone (periosteum) are made numb with a local anesthetic.  The bone marrow aspiration needle is inserted. You will feel pressure on your bone.  Once inside the marrow cavity, a sample of bone marrow is sucked out (aspirated) for pathology slides.  The material collected for bone marrow slides is processed  immediately by a technologist.  The technician selects the marrow particles to make the slides for pathology.  The marrow aspiration needle is removed. Then pressure is applied to the site with gauze until bleeding has stopped. Following an aspiration, a bone marrow biopsy may be performed as well. The technique for this is very similar. A dressing is then applied.  RISKS AND COMPLICATIONS  The main complications of a bone marrow aspiration and biopsy include infection and bleeding.  Complications are uncommon. The procedure may not be performed in patients with bleeding tendencies.  A very rare complication from the procedure is injury to the heart during a breastbone (sternal) marrow aspiration. Only bone marrow aspirations are performed in this area.  Long-lasting pain at the site of the bone marrow aspiration and biopsy is uncommon. Your caregiver will let you know when you are to get your results and will discuss them with you. You may make an appointment with your caregiver to find out the results. Do not assume everything is normal if you have not heard from your caregiver or the medical facility. It is important for you to follow up on all of your test results. Document Released: 03/17/2004 Document Revised: 06/06/2011 Document Reviewed: 03/11/2008 [facility] Patient Information 2014 [place].

## 2013-04-16 NOTE — Telephone Encounter (Signed)
gv adn printed appt sched and avs for pt for Jan

## 2013-04-16 NOTE — Patient Instructions (Addendum)
Blood Transfusion Information WHAT IS A BLOOD TRANSFUSION? A transfusion is the replacement of blood or some of its parts. Blood is made up of multiple cells which provide different functions.  Red blood cells carry oxygen and are used for blood loss replacement.  White blood cells fight against infection.  Platelets control bleeding.  Plasma helps clot blood.  Other blood products are available for specialized needs, such as hemophilia or other clotting disorders. BEFORE THE TRANSFUSION  Who gives blood for transfusions?   You may be able to donate blood to be used at a later date on yourself (autologous donation).  Relatives can be asked to donate blood. This is generally not any safer than if you have received blood from a stranger. The same precautions are taken to ensure safety when a relative's blood is donated.  Healthy volunteers who are fully evaluated to make sure their blood is safe. This is blood bank blood. Transfusion therapy is the safest it has ever been in the practice of medicine. Before blood is taken from a donor, a complete history is taken to make sure that person has no history of diseases nor engages in risky social behavior (examples are intravenous drug use or sexual activity with multiple partners). The donor's travel history is screened to minimize risk of transmitting infections, such as malaria. The donated blood is tested for signs of infectious diseases, such as HIV and hepatitis. The blood is then tested to be sure it is compatible with you in order to minimize the chance of a transfusion reaction. If you or a relative donates blood, this is often done in anticipation of surgery and is not appropriate for emergency situations. It takes many days to process the donated blood. RISKS AND COMPLICATIONS Although transfusion therapy is very safe and saves many lives, the main dangers of transfusion include:   Getting an infectious disease.  Developing a  transfusion reaction. This is an allergic reaction to something in the blood you were given. Every precaution is taken to prevent this. The decision to have a blood transfusion has been considered carefully by your caregiver before blood is given. Blood is not given unless the benefits outweigh the risks. AFTER THE TRANSFUSION  Right after receiving a blood transfusion, you will usually feel much better and more energetic. This is especially true if your red blood cells have gotten low (anemic). The transfusion raises the level of the red blood cells which carry oxygen, and this usually causes an energy increase.  The nurse administering the transfusion will monitor you carefully for complications. HOME CARE INSTRUCTIONS  No special instructions are needed after a transfusion. You may find your energy is better. Speak with your caregiver about any limitations on activity for underlying diseases you may have. SEEK MEDICAL CARE IF:   Your condition is not improving after your transfusion.  You develop redness or irritation at the intravenous (IV) site. SEEK IMMEDIATE MEDICAL CARE IF:  Any of the following symptoms occur over the next 12 hours:  Shaking chills.  You have a temperature by mouth above 102 F (38.9 C), not controlled by medicine.  Chest, back, or muscle pain.  People around you feel you are not acting correctly or are confused.  Shortness of breath or difficulty breathing.  Dizziness and fainting.  You get a rash or develop hives.  You have a decrease in urine output.  Your urine turns a dark color or changes to pink, red, or brown. Any of the following   symptoms occur over the next 10 days:  You have a temperature by mouth above 102 F (38.9 C), not controlled by medicine.  Shortness of breath.  Weakness after normal activity.  The white part of the eye turns yellow (jaundice).  You have a decrease in the amount of urine or are urinating less often.  Your  urine turns a dark color or changes to pink, red, or brown. Document Released: 03/11/2000 Document Revised: 06/06/2011 Document Reviewed: 10/29/2007 [facility] Patient Information 2014 [place]. Bone Marrow Aspiration and Bone Biopsy Examination of the bone marrow is a valuable test to diagnose blood disorders. A bone marrow biopsy takes a sample of bone and a small amount of fluid and cells from inside the bone. A bone marrow aspiration removes only the marrow. Bone marrow aspiration and bone biopsies are used to stage different disorders of the blood, such as leukemia. Staging will help your caregiver understand how far the disease has progressed.  The tests are also useful in diagnosing:  Fever of unknown origin (FUO).  Bacterial infections and other widespread fungal infections.  Cancers that have spread (metastasized) to the bone marrow.  Diseases that are characterized by a deficiency of an enzyme (storage diseases). This includes:  Niemann-Pick disease.  Gaucher disease. PROCEDURE  Sites used to get samples include:   Back of your hip bone (posterior iliac crest).  Both aspiration and biopsy.  Front of your hip bone (anterior iliac crest).  Both aspiration and biopsy.  Breastbone (sternum).  Aspiration from your breastbone (done only in adults). This method is rarely used. When you get a hip bone aspiration:  You are placed lying on your side with the upper knee brought up and flexed with the lower leg straight.  The site is prepared, cleaned with an antiseptic scrub, and draped. This keeps the biopsy area clean.  The skin and the area down to the lining of the bone (periosteum) are made numb with a local anesthetic.  The bone marrow aspiration needle is inserted. You will feel pressure on your bone.  Once inside the marrow cavity, a sample of bone marrow is sucked out (aspirated) for pathology slides.  The material collected for bone marrow slides is processed  immediately by a technologist.  The technician selects the marrow particles to make the slides for pathology.  The marrow aspiration needle is removed. Then pressure is applied to the site with gauze until bleeding has stopped. Following an aspiration, a bone marrow biopsy may be performed as well. The technique for this is very similar. A dressing is then applied.  RISKS AND COMPLICATIONS  The main complications of a bone marrow aspiration and biopsy include infection and bleeding.  Complications are uncommon. The procedure may not be performed in patients with bleeding tendencies.  A very rare complication from the procedure is injury to the heart during a breastbone (sternal) marrow aspiration. Only bone marrow aspirations are performed in this area.  Long-lasting pain at the site of the bone marrow aspiration and biopsy is uncommon. Your caregiver will let you know when you are to get your results and will discuss them with you. You may make an appointment with your caregiver to find out the results. Do not assume everything is normal if you have not heard from your caregiver or the medical facility. It is important for you to follow up on all of your test results. Document Released: 03/17/2004 Document Revised: 06/06/2011 Document Reviewed: 03/11/2008 [facility] Patient Information 2014 [place].

## 2013-04-16 NOTE — Progress Notes (Signed)
This encounter was created in error - please disregard.

## 2013-04-16 NOTE — Progress Notes (Signed)
Pt chart closed by after hours RN inadvertantly.  Chart should have remained open for IT - unable to chart on the blood.

## 2013-04-16 NOTE — Progress Notes (Signed)
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 This patient had background history of extensive stage small cell lung cancer, was treated with chemotherapy. 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 male returns for further followup. He denies any symptoms. The patient denies any recent signs or symptoms of bleeding such as spontaneous epistaxis, hematuria or hematochezia.  I have reviewed the past medical history, past surgical history, social history and family history with the patient and they are unchanged from previous note.  ALLERGIES:  has No Known Allergies.  MEDICATIONS:  Current Outpatient Prescriptions  Medication Sig Dispense Refill  . aspirin 81 MG tablet Take 81 mg by mouth daily.      . Cholecalciferol (VITAMIN D-3) 5000 UNITS TABS Take 1 tablet by mouth daily.      . ciprofloxacin (CIPRO) 250 MG tablet Take 1 tablet (250 mg total) by mouth 2 (two) times daily.  60 tablet  1  . digoxin (LANOXIN) 0.125 MG tablet Take 0.125 mg by mouth daily.      . metoprolol tartrate (LOPRESSOR) 25 MG tablet TAKE ONE AND ONE-HALF TABLETS BY MOUTH TWICE DAILY   90 tablet  6  . simvastatin (ZOCOR) 40 MG tablet Take 40 mg by mouth at bedtime.         No current facility-administered medications for this visit.   Facility-Administered Medications Ordered in Other Visits  Medication Dose Route Frequency Provider Last Rate Last Dose  . 0.9 %  sodium chloride infusion  250 mL Intravenous Once  Heath Lark, MD        REVIEW OF SYSTEMS:   Behavioral/Psych: Mood is stable, no new changes  All other systems were reviewed with the patient and are negative.  PHYSICAL EXAMINATION: ECOG PERFORMANCE STATUS: 0 - Asymptomatic  Filed Vitals:   04/16/13 1008  BP: 114/66  Pulse: 65  Temp: 97.7 F (36.5 C)  Resp: 18   Filed Weights   04/16/13 1008  Weight: 158 lb 14.4 oz (72.077 kg)    GENERAL:alert, no distress and comfortable. He looks pale NEURO: alert & oriented x 3 with fluent speech, no focal motor/sensory deficits  LABORATORY DATA:  I have reviewed the data as listed    Component Value Date/Time   NA 141 04/09/2013 1413   NA 139 09/12/2011 1010   NA 142 06/20/2011 1158   K 3.9 04/09/2013 1413   K 3.7 09/12/2011 1010   K 3.9 06/20/2011 1158   CL 108* 08/14/2012 0958   CL 107 09/12/2011 1010   CL 102 06/20/2011 1158   CO2 25 04/09/2013 1413   CO2 26 09/12/2011 1010   CO2 29 06/20/2011 1158   GLUCOSE 136 04/09/2013 1413   GLUCOSE 101* 08/14/2012 0958   GLUCOSE 100* 09/12/2011 1010   GLUCOSE 99 06/20/2011 1158   BUN 18.3 04/09/2013 1413   BUN 20 09/12/2011 1010   BUN 16 06/20/2011 1158   CREATININE 1.2 04/09/2013 1413   CREATININE 1.18 09/12/2011 1010   CREATININE 1.0 06/20/2011 1158   CALCIUM 8.6 04/09/2013 1413   CALCIUM 8.8 09/12/2011 1010  CALCIUM 8.4 06/20/2011 1158   PROT 6.7 04/09/2013 1413   PROT 6.6 09/12/2011 1010   PROT 7.7 06/20/2011 1158   ALBUMIN 3.6 04/09/2013 1413   ALBUMIN 3.8 09/12/2011 1010   AST 14 04/09/2013 1413   AST 25 09/12/2011 1010   AST 25 06/20/2011 1158   ALT 10 04/09/2013 1413   ALT 28 09/12/2011 1010   ALT 28 06/20/2011 1158   ALKPHOS 61 04/09/2013 1413   ALKPHOS 67 09/12/2011 1010   ALKPHOS 95* 06/20/2011 1158   BILITOT 0.40 04/09/2013 1413   BILITOT 0.3 09/12/2011 1010   BILITOT 0.50 06/20/2011 1158   GFRNONAA 81* 01/24/2011 0155   GFRAA >90 01/24/2011 0155    No results found for this basename: SPEP, UPEP,  kappa and lambda light chains    Lab  Results  Component Value Date   WBC 2.1* 04/16/2013   NEUTROABS 0.6* 04/16/2013   HGB 7.3* 04/16/2013   HCT 20.9* 04/16/2013   MCV 105.0* 04/16/2013   PLT 29* 04/16/2013      Chemistry      Component Value Date/Time   NA 141 04/09/2013 1413   NA 139 09/12/2011 1010   NA 142 06/20/2011 1158   K 3.9 04/09/2013 1413   K 3.7 09/12/2011 1010   K 3.9 06/20/2011 1158   CL 108* 08/14/2012 0958   CL 107 09/12/2011 1010   CL 102 06/20/2011 1158   CO2 25 04/09/2013 1413   CO2 26 09/12/2011 1010   CO2 29 06/20/2011 1158   BUN 18.3 04/09/2013 1413   BUN 20 09/12/2011 1010   BUN 16 06/20/2011 1158   CREATININE 1.2 04/09/2013 1413   CREATININE 1.18 09/12/2011 1010   CREATININE 1.0 06/20/2011 1158      Component Value Date/Time   CALCIUM 8.6 04/09/2013 1413   CALCIUM 8.8 09/12/2011 1010   CALCIUM 8.4 06/20/2011 1158   ALKPHOS 61 04/09/2013 1413   ALKPHOS 67 09/12/2011 1010   ALKPHOS 95* 06/20/2011 1158   AST 14 04/09/2013 1413   AST 25 09/12/2011 1010   AST 25 06/20/2011 1158   ALT 10 04/09/2013 1413   ALT 28 09/12/2011 1010   ALT 28 06/20/2011 1158   BILITOT 0.40 04/09/2013 1413   BILITOT 0.3 09/12/2011 1010   BILITOT 0.50 06/20/2011 1158       RADIOGRAPHIC STUDIES: I have personally reviewed the radiological images as listed and agreed with the findings in the report. Nm Pet Image Restag (ps) Skull Base To Thigh  04/15/2013   CLINICAL DATA:  Subsequent treatment strategy for small cell lung cancer. Restaging scan.  EXAM: NUCLEAR MEDICINE PET SKULL BASE TO THIGH  FASTING BLOOD GLUCOSE:  Value: 116m/dl  TECHNIQUE: 16.5 mCi F-18 FDG was injected intravenously. CT data was obtained and used for attenuation correction and anatomic localization only. (This was not acquired as a diagnostic CT examination.) Additional exam technical data entered on technologist worksheet.  COMPARISON:  CT of the chest, abdomen and pelvis 12/31/2012. PET-CT 02/10/2011.  FINDINGS: NECK  No hypermetabolic lymph nodes in the neck.  CHEST  No  hypermetabolic mediastinal or hilar nodes. No suspicious pulmonary nodules on the CT scan. Multiple calcified granulomas are again noted in the lungs bilaterally. Focal area of architectural distortion in the right apex is unchanged, presumably post infectious or inflammatory scarring. Multiple right apical bulla again noted. Mild paraseptal emphysema. Heart size is mildly enlarged. There is no significant pericardial fluid, thickening or pericardial calcification. There is atherosclerosis of the thoracic  aorta, the great vessels of the mediastinum and the coronary arteries, including calcified atherosclerotic plaque in the left main, left anterior descending, left circumflex and right coronary arteries.  ABDOMEN/PELVIS  No abnormal hypermetabolic activity within the liver, pancreas, adrenal glands, or spleen. No hypermetabolic lymph nodes in the abdomen or pelvis. No significant volume of ascites. No pneumoperitoneum. No pathologic distention of small bowel. Normal appendix. Atherosclerosis throughout the abdominal and pelvic vasculature, without evidence of aneurysm. Prostate gland and urinary bladder are unremarkable in appearance.  SKELETON  No focal hypermetabolic activity to suggest skeletal metastasis.  IMPRESSION: 1. No findings to suggest disease recurrence in the neck, chest, abdomen or pelvis. 2. Atherosclerosis, including left main and 3 vessel coronary artery disease. Assessment for potential risk factor modification, dietary therapy or pharmacologic therapy may be warranted, if clinically indicated. 3. Sequela of old granulomatous disease redemonstrated. 4. Additional incidental findings, as above.   Electronically Signed   By: Vinnie Langton M.D.   On: 04/15/2013 16:31    ASSESSMENT & PLAN:  #1 history of small cell lung cancer There is no evidence of disease recurrence on the PET/CT scan #2 severe leukopenia #3 severe anemia  #4 severe thrombocytopenia I have stopped his anticoagulation  therapy. I recommend he stop aspirin as well. 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 Prognosis is very poor. I discussed with them the pros and cons of performing a bone marrow aspirate and biopsy. They agree to proceed. Bone Marrow Biopsy and Aspiration Procedure Note   Informed consent was obtained and potential risks including bleeding, infection and pain were reviewed with the patient. I verified that the patient has been fasting since midnight.  The patient's name, date of birth, identification, consent and allergies were verified prior to the start of procedure and time out was performed.  The left posterior iliac crest was chosen as the site of biopsy.  The skin was prepped with Betadine solution.   5 cc of 2% lidocaine was used to provide local anaesthesia.   10 cc of bone marrow aspirate was obtained followed by 1 inch biopsy.   The procedure was tolerated well and there were no complications.  The patient was stable at the end of the procedure.  Specimens sent for flow cytometry, cytogenetics and additional studies.  We discussed some of the risks, benefits, and alternatives of blood transfusions. The patient is symptomatic from anemia and the hemoglobin level is critically low.  Some of the side-effects to be expected including risks of transfusion reactions, chills, infection, syndrome of volume overload and risk of hospitalization from various reasons and the patient is willing to proceed and went ahead to sign consent today. I proceed to give him 1 unit of blood today  Orders Placed This Encounter  Procedures  . CBC with Differential    Standing Status: Future     Number of Occurrences:      Standing Expiration Date: 04/16/2014  . Comprehensive metabolic panel    Standing Status: Future     Number of Occurrences:      Standing Expiration Date: 04/16/2014  . Hold Tube, Blood Bank     Standing Status: Future     Number of Occurrences:      Standing Expiration Date: 04/16/2014   All questions were answered. The patient knows to call the clinic with any problems, questions or concerns. No barriers to learning was detected. I spent 40  minutes counseling the patient face to face. The total time spent in the appointment was 60 minutes and more than 50% was on counseling and review of test results     [facility], [place], MD 04/16/2013 12:30 PM

## 2013-04-17 LAB — TYPE AND SCREEN
ABO/RH(D): O POS
Antibody Screen: NEGATIVE
Unit division: 0

## 2013-04-22 ENCOUNTER — Telehealth: Payer: Self-pay | Admitting: Hematology and Oncology

## 2013-04-22 ENCOUNTER — Telehealth: Payer: Self-pay | Admitting: *Deleted

## 2013-04-22 NOTE — Telephone Encounter (Signed)
Called patient's daughter to let her know patient has an appointment this Thursday 04/23/13 at 1:00pm with Dr Zenda Alpers.

## 2013-04-22 NOTE — Telephone Encounter (Signed)
I reviewed the bone marrow biopsy results with the patient's daughter at [phone]. 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 I recommend referral to [facility] for treatment recommendation. She agreed.

## 2013-04-23 ENCOUNTER — Telehealth: Payer: Self-pay | Admitting: Hematology and Oncology

## 2013-04-23 LAB — TISSUE HYBRIDIZATION (BONE MARROW)-NCBH

## 2013-04-23 LAB — CHROMOSOME ANALYSIS, BONE MARROW

## 2013-04-23 NOTE — Telephone Encounter (Signed)
Faxed pt medical records to [facility]. Slides and scans will be fedex'ed °

## 2013-04-24 ENCOUNTER — Ambulatory Visit (HOSPITAL_BASED_OUTPATIENT_CLINIC_OR_DEPARTMENT_OTHER): Payer: Medicare Other | Admitting: Hematology and Oncology

## 2013-04-24 ENCOUNTER — Encounter: Payer: Self-pay | Admitting: Hematology and Oncology

## 2013-04-24 ENCOUNTER — Other Ambulatory Visit (HOSPITAL_BASED_OUTPATIENT_CLINIC_OR_DEPARTMENT_OTHER): Payer: Medicare Other

## 2013-04-24 VITALS — BP 133/80 | HR 58 | Temp 97.6°F | Resp 18 | Ht 68.0 in | Wt 159.2 lb

## 2013-04-24 DIAGNOSIS — Z85118 Personal history of other malignant neoplasm of bronchus and lung: Secondary | ICD-10-CM

## 2013-04-24 DIAGNOSIS — D649 Anemia, unspecified: Secondary | ICD-10-CM

## 2013-04-24 DIAGNOSIS — D696 Thrombocytopenia, unspecified: Secondary | ICD-10-CM

## 2013-04-24 DIAGNOSIS — D72819 Decreased white blood cell count, unspecified: Secondary | ICD-10-CM

## 2013-04-24 DIAGNOSIS — C9200 Acute myeloblastic leukemia, not having achieved remission: Secondary | ICD-10-CM

## 2013-04-24 DIAGNOSIS — C3490 Malignant neoplasm of unspecified part of unspecified bronchus or lung: Secondary | ICD-10-CM

## 2013-04-24 HISTORY — DX: Acute myeloblastic leukemia, not having achieved remission: C92.00

## 2013-04-24 LAB — BONE MARROW EXAM

## 2013-04-24 LAB — COMPREHENSIVE METABOLIC PANEL (CC13)
ALT: 11 U/L (ref 0–55)
AST: 14 U/L (ref 5–34)
Albumin: 3.9 g/dL (ref 3.5–5.0)
Alkaline Phosphatase: 69 U/L (ref 40–150)
Anion Gap: 6 mEq/L (ref 3–11)
BUN: 22.4 mg/dL (ref 7.0–26.0)
CO2: 27 mEq/L (ref 22–29)
Calcium: 9.1 mg/dL (ref 8.4–10.4)
Chloride: 106 mEq/L (ref 98–109)
Creatinine: 1.2 mg/dL (ref 0.7–1.3)
Glucose: 104 mg/dl (ref 70–140)
Potassium: 4.2 mEq/L (ref 3.5–5.1)
Sodium: 139 mEq/L (ref 136–145)
Total Bilirubin: 0.51 mg/dL (ref 0.20–1.20)
Total Protein: 7.1 g/dL (ref 6.4–8.3)

## 2013-04-24 LAB — CBC WITH DIFFERENTIAL/PLATELET
BASO%: 0.6 % (ref 0.0–2.0)
Basophils Absolute: 0 10*3/uL (ref 0.0–0.1)
EOS%: 0.4 % (ref 0.0–7.0)
Eosinophils Absolute: 0 10*3/uL (ref 0.0–0.5)
HCT: 27.2 % — ABNORMAL LOW (ref 38.4–49.9)
HGB: 9.5 g/dL — ABNORMAL LOW (ref 13.0–17.1)
LYMPH%: 65.4 % — ABNORMAL HIGH (ref 14.0–49.0)
MCH: 37.5 pg — ABNORMAL HIGH (ref 27.2–33.4)
MCHC: 34.8 g/dL (ref 32.0–36.0)
MCV: 107.8 fL — ABNORMAL HIGH (ref 79.3–98.0)
MONO#: 0.1 10*3/uL (ref 0.1–0.9)
MONO%: 3.2 % (ref 0.0–14.0)
NEUT#: 0.9 10*3/uL — ABNORMAL LOW (ref 1.5–6.5)
NEUT%: 30.4 % — ABNORMAL LOW (ref 39.0–75.0)
Platelets: 25 10*3/uL — ABNORMAL LOW (ref 140–400)
RBC: 2.52 10*6/uL — ABNORMAL LOW (ref 4.20–5.82)
RDW: 18.7 % — ABNORMAL HIGH (ref 11.0–14.6)
WBC: 3.1 10*3/uL — ABNORMAL LOW (ref 4.0–10.3)
lymph#: 2 10*3/uL (ref 0.9–3.3)

## 2013-04-24 LAB — TECHNOLOGIST REVIEW: Technologist Review: 12

## 2013-04-24 LAB — HOLD TUBE, BLOOD BANK

## 2013-04-24 NOTE — Progress Notes (Signed)
[name] OFFICE PROGRESS NOTE  Patient Care Team: Gara Kroner, MD as PCP - General (Family Medicine) Troy Sine, MD as Attending Physician (Cardiology) Thea Silversmith, MD (Radiation Oncology) Heath Lark, MD as Consulting Physician (Hematology and Oncology)  DIAGNOSIS: AML with ETO and trisomy 8  SUMMARY OF ONCOLOGIC HISTORY: This is a pleasant 25 your gentleman with background history of extensive stage small cell lung cancer treated with chemotherapy and recently presented with severe pancytopenia. On 04/16/2013, he had bone marrow aspirate biopsy that confirmed the diagnosis of AML. He received 1 unit of blood transfusion  INTERVAL HISTORY: Gerald [name] 73 y.o. male returns for further followup. The patient denies any recent signs or symptoms of bleeding such as spontaneous epistaxis, hematuria or hematochezia. He denies any recent fever, chills, night sweats or abnormal weight loss  I have reviewed the past medical history, past surgical history, social history and family history with the patient and they are unchanged from previous note.  ALLERGIES:  has No Known Allergies.  MEDICATIONS:  Current Outpatient Prescriptions  Medication Sig Dispense Refill  . aspirin 81 MG tablet Take 81 mg by mouth daily.      . Cholecalciferol (VITAMIN D-3) 5000 UNITS TABS Take 1 tablet by mouth daily.      . ciprofloxacin (CIPRO) 250 MG tablet Take 1 tablet (250 mg total) by mouth 2 (two) times daily.  60 tablet  1  . metoprolol tartrate (LOPRESSOR) 25 MG tablet TAKE ONE AND ONE-HALF TABLETS BY MOUTH TWICE DAILY   90 tablet  6  . simvastatin (ZOCOR) 40 MG tablet Take 40 mg by mouth at bedtime.         No current facility-administered medications for this visit.    REVIEW OF SYSTEMS:   Constitutional: Denies fevers, chills or abnormal weight loss Behavioral/Psych: Mood is stable, no new changes  All other systems were reviewed with the patient and are  negative.  PHYSICAL EXAMINATION: ECOG PERFORMANCE STATUS: 0 - Asymptomatic  Filed Vitals:   04/24/13 1320  BP: 133/80  Pulse: 58  Temp: 97.6 F (36.4 C)  Resp: 18   Filed Weights   04/24/13 1320  Weight: 159 lb 3.2 oz (72.213 kg)    GENERAL:alert, no distress and comfortable. He looked pale Musculoskeletal:no cyanosis of digits and no clubbing  NEURO: alert & oriented x 3 with fluent speech, no focal motor/sensory deficits  LABORATORY DATA:  I have reviewed the data as listed    Component Value Date/Time   NA 141 04/09/2013 1413   NA 139 09/12/2011 1010   NA 142 06/20/2011 1158   K 3.9 04/09/2013 1413   K 3.7 09/12/2011 1010   K 3.9 06/20/2011 1158   CL 108* 08/14/2012 0958   CL 107 09/12/2011 1010   CL 102 06/20/2011 1158   CO2 25 04/09/2013 1413   CO2 26 09/12/2011 1010   CO2 29 06/20/2011 1158   GLUCOSE 136 04/09/2013 1413   GLUCOSE 101* 08/14/2012 0958   GLUCOSE 100* 09/12/2011 1010   GLUCOSE 99 06/20/2011 1158   BUN 18.3 04/09/2013 1413   BUN 20 09/12/2011 1010   BUN 16 06/20/2011 1158   CREATININE 1.2 04/09/2013 1413   CREATININE 1.18 09/12/2011 1010   CREATININE 1.0 06/20/2011 1158   CALCIUM 8.6 04/09/2013 1413   CALCIUM 8.8 09/12/2011 1010   CALCIUM 8.4 06/20/2011 1158   PROT 6.7 04/09/2013 1413   PROT 6.6 09/12/2011 1010   PROT 7.7 06/20/2011 1158  ALBUMIN 3.6 04/09/2013 1413   ALBUMIN 3.8 09/12/2011 1010   AST 14 04/09/2013 1413   AST 25 09/12/2011 1010   AST 25 06/20/2011 1158   ALT 10 04/09/2013 1413   ALT 28 09/12/2011 1010   ALT 28 06/20/2011 1158   ALKPHOS 61 04/09/2013 1413   ALKPHOS 67 09/12/2011 1010   ALKPHOS 95* 06/20/2011 1158   BILITOT 0.40 04/09/2013 1413   BILITOT 0.3 09/12/2011 1010   BILITOT 0.50 06/20/2011 1158   GFRNONAA 81* 01/24/2011 0155   GFRAA >90 01/24/2011 0155    No results found for this basename: SPEP,  UPEP,   kappa and lambda light chains    Lab Results  Component Value Date   WBC 3.1* 04/24/2013   NEUTROABS 0.9* 04/24/2013   HGB 9.5*  04/24/2013   HCT 27.2* 04/24/2013   MCV 107.8* 04/24/2013   PLT 25* 04/24/2013      Chemistry      Component Value Date/Time   NA 141 04/09/2013 1413   NA 139 09/12/2011 1010   NA 142 06/20/2011 1158   K 3.9 04/09/2013 1413   K 3.7 09/12/2011 1010   K 3.9 06/20/2011 1158   CL 108* 08/14/2012 0958   CL 107 09/12/2011 1010   CL 102 06/20/2011 1158   CO2 25 04/09/2013 1413   CO2 26 09/12/2011 1010   CO2 29 06/20/2011 1158   BUN 18.3 04/09/2013 1413   BUN 20 09/12/2011 1010   BUN 16 06/20/2011 1158   CREATININE 1.2 04/09/2013 1413   CREATININE 1.18 09/12/2011 1010   CREATININE 1.0 06/20/2011 1158      Component Value Date/Time   CALCIUM 8.6 04/09/2013 1413   CALCIUM 8.8 09/12/2011 1010   CALCIUM 8.4 06/20/2011 1158   ALKPHOS 61 04/09/2013 1413   ALKPHOS 67 09/12/2011 1010   ALKPHOS 95* 06/20/2011 1158   AST 14 04/09/2013 1413   AST 25 09/12/2011 1010   AST 25 06/20/2011 1158   ALT 10 04/09/2013 1413   ALT 28 09/12/2011 1010   ALT 28 06/20/2011 1158   BILITOT 0.40 04/09/2013 1413   BILITOT 0.3 09/12/2011 1010   BILITOT 0.50 06/20/2011 1158     ASSESSMENT & PLAN:  #1 history of small cell lung cancer There is no evidence of disease recurrence on the PET/CT scan #2 severe leukopenia #3 severe anemia  #4 severe thrombocytopenia #5 AML The patient does not require blood transfusion or platelet transfusion today. He is not symptomatic. I discussed with the family in great detail. We do not typically treat AML patient here and unless the patient choose to do palliative chemotherapy with Vidaza. I have referred the patient to [facility] for consideration for systemic chemotherapy.  All questions were answered. The patient knows to call the clinic with any problems, questions or concerns. No barriers to learning was detected.    [facility], [place], MD 04/24/2013 1:36 PM

## 2013-07-02 ENCOUNTER — Other Ambulatory Visit (HOSPITAL_COMMUNITY): Payer: Medicare Other

## 2013-07-02 ENCOUNTER — Other Ambulatory Visit: Payer: Medicare Other

## 2013-07-03 ENCOUNTER — Ambulatory Visit: Payer: Medicare Other | Admitting: Hematology and Oncology

## 2013-07-19 ENCOUNTER — Telehealth: Payer: Self-pay | Admitting: *Deleted

## 2013-07-19 MED ORDER — RIVAROXABAN 20 MG PO TABS: 20.0000 mg | ORAL_TABLET | Freq: Every day | ORAL | Status: DC

## 2013-07-19 NOTE — Telephone Encounter (Signed)
Spoke with daughter about whether or not he restarted xarelto.  Daughter states he had 5 chemo treatment and then the oncologist told him to restart the xarelto.  PA required by his insurance which Enid Cutter will take care of.

## 2013-07-22 NOTE — Telephone Encounter (Signed)
Prior authorization for Xarelto 20 mg faxed to [employer]. Awaiting approval.

## 2013-07-22 NOTE — Telephone Encounter (Signed)
Error

## 2013-07-24 NOTE — Telephone Encounter (Signed)
Prior authorization for Xarelto 20 mg has been approved through 07/22/2014. Pharmacy will be notified. Patient notified.

## 2013-09-03 ENCOUNTER — Encounter (HOSPITAL_COMMUNITY): Payer: Self-pay

## 2013-11-18 ENCOUNTER — Other Ambulatory Visit: Payer: Self-pay

## 2013-11-18 MED ORDER — XARELTO 20 MG PO TABS: 20.0000 mg | ORAL_TABLET | Freq: Every day | ORAL | Status: DC

## 2013-11-18 NOTE — Telephone Encounter (Signed)
Rx was sent to pharmacy electronically. 

## 2013-11-22 ENCOUNTER — Other Ambulatory Visit: Payer: Self-pay | Admitting: Cardiovascular Disease

## 2013-12-06 ENCOUNTER — Emergency Department (INDEPENDENT_AMBULATORY_CARE_PROVIDER_SITE_OTHER)
Admission: EM | Admit: 2013-12-06 | Discharge: 2013-12-06 | Disposition: A | Discharge status: Home / Self Care | Payer: Medicare Other | Source: Home / Self Care | Attending: Emergency Medicine | Admitting: Emergency Medicine

## 2013-12-06 ENCOUNTER — Encounter (HOSPITAL_COMMUNITY): Payer: Self-pay | Admitting: Emergency Medicine

## 2013-12-06 DIAGNOSIS — R059 Cough, unspecified: Secondary | ICD-10-CM

## 2013-12-06 DIAGNOSIS — R05 Cough: Secondary | ICD-10-CM

## 2013-12-06 DIAGNOSIS — F17200 Nicotine dependence, unspecified, uncomplicated: Secondary | ICD-10-CM

## 2013-12-06 DIAGNOSIS — J988 Other specified respiratory disorders: Secondary | ICD-10-CM

## 2013-12-06 DIAGNOSIS — C3490 Malignant neoplasm of unspecified part of unspecified bronchus or lung: Secondary | ICD-10-CM

## 2013-12-06 MED ORDER — HYDROCODONE-HOMATROPINE 5-1.5 MG/5ML PO SYRP: 5.0000 mL | ORAL_SOLUTION | Freq: Four times a day (QID) | ORAL | Status: DC | PRN

## 2013-12-06 MED ORDER — AMOXICILLIN 500 MG PO CAPS: 500.0000 mg | ORAL_CAPSULE | Freq: Three times a day (TID) | ORAL | Status: DC

## 2013-12-06 MED ORDER — AMOXICILLIN 500 MG PO CAPS: 500.0000 mg | ORAL_CAPSULE | Freq: Three times a day (TID) | ORAL | Status: AC

## 2013-12-06 NOTE — ED Notes (Signed)
Patient c/o possible sinus infection. Has sx including nasal congestion, chest congestion with cough. Reports he has had fever everyday for about 1 week. Son is with him translating for the patient. Son reports he has had similar sx and was told he had a sinus infection. Patient is alert and oriented and in no acute distress.

## 2013-12-06 NOTE — ED Provider Notes (Signed)
Medical screening examination/treatment/procedure(s) were performed by non-physician practitioner and as supervising physician I was immediately available for consultation/collaboration.  Philipp Deputy, M.D.  Harden Mo, MD 12/06/13 [phone]

## 2013-12-06 NOTE — ED Provider Notes (Signed)
CSN: [id number]     Arrival date & time 12/06/13  1743 History   First MD Initiated Contact with Patient 12/06/13 1756     Chief Complaint  Patient presents with  . URI   (Consider location/radiation/quality/duration/timing/severity/associated sxs/prior Treatment) HPI Comments: Patient's son is Optometrist. He is a 73 yo [place] male with a history of small cell carcinoma, previous smoker and CLL. He has a 7 day history of dry cough, sore throat, malaise, subjective fever and nasal congestion and pressure. No known exposures. No recent infections.   Patient is a 73 y.o. male presenting with URI. The history is provided by a caregiver.  URI   Past Medical History  Diagnosis Date  . A-fib   . Hyperlipidemia   . Small cell lung cancer 12/2010  . Smoking   . CAD (coronary artery disease)     last cath 2008 with noncritical CAD  . Allergy     seasonal allergies  . GERD (gastroesophageal reflux disease)   . Headache 12/17/2012  . Other pancytopenia 04/09/2013  . Unspecified deficiency anemia 04/16/2013  . Leukemia   . AML (acute myeloid leukemia) 04/24/2013   Past Surgical History  Procedure Laterality Date  . Catherization  2008    cardiac catherization   No family history on file. History  Substance Use Topics  . Smoking status: Former Smoker -- 1.00 packs/day for 50 years    Types: Cigarettes    Quit date: 03/28/2006  . Smokeless tobacco: Not on file  . Alcohol Use: No    Review of Systems  All other systems reviewed and are negative.   Allergies  Review of patient's allergies indicates no known allergies.  Home Medications   Prior to Admission medications   Medication Sig Start Date End Date Taking? Authorizing Provider  amoxicillin (AMOXIL) 500 MG capsule Take 1 capsule (500 mg total) by mouth 3 (three) times daily. 12/06/13 12/12/13  Bjorn Pippin, [name]  amoxicillin (AMOXIL) 500 MG capsule Take 1 capsule (500 mg total) by mouth 3 (three) times daily. 12/06/13    Bjorn Pippin, [name]  Cholecalciferol (VITAMIN D-3) 5000 UNITS TABS Take 1 tablet by mouth daily.    Historical Provider, MD  ciprofloxacin (CIPRO) 250 MG tablet Take 1 tablet (250 mg total) by mouth 2 (two) times daily. 04/09/13   Heath Lark, MD  HYDROcodone-homatropine (HYCODAN) 5-1.5 MG/5ML syrup Take 5 mLs by mouth every 6 (six) hours as needed for cough. 12/06/13   Bjorn Pippin, [name]  metoprolol tartrate (LOPRESSOR) 25 MG tablet TAKE ONE AND ONE-HALF TABLETS BY MOUTH TWICE DAILY  03/05/13   Troy Sine, MD  simvastatin (ZOCOR) 40 MG tablet Take 40 mg by mouth at bedtime.      Historical Provider, MD  XARELTO 20 MG TABS tablet Take 1 tablet (20 mg total) by mouth daily with supper. 11/18/13   Troy Sine, MD  XARELTO 20 MG TABS tablet TAKE ONE TABLET BY MOUTH ONE TIME DAILY with supper 11/23/13   Troy Sine, MD   BP 134/88  Pulse 71  Temp(Src) 98.7 F (37.1 C) (Oral)  Resp 16  SpO2 96% Physical Exam  Nursing note and vitals reviewed. Constitutional: He is oriented to person, place, and time. He appears well-developed and well-nourished. No distress.  HENT:  Head: Normocephalic and atraumatic.  Right Ear: External ear normal.  Left Ear: External ear normal.  Mouth/Throat: Oropharynx is clear and moist. No oropharyngeal exudate.  Nasal turbinates erythematous and swollen  Eyes: Pupils are equal, round, and reactive to light. Right eye exhibits no discharge. Left eye exhibits no discharge. No scleral icterus.  Neck: Normal range of motion.  Pulmonary/Chest: Effort normal. No respiratory distress. He has no wheezes.  Mild lower basilar rhonci, no definite crackles or wheeze  Lymphadenopathy:    He has no cervical adenopathy.  Neurological: He is alert and oriented to person, place, and time.  Skin: Skin is warm and dry. He is not diaphoretic. No erythema.  Psychiatric: His behavior is normal.    ED Course  Procedures (including critical care time) Labs Review Labs  Reviewed - No data to display  Imaging Review No results found.   MDM   1. Respiratory infection   2. Cough   3. Smoking   4. Small cell lung cancer, unspecified laterality    Given duration and compromised state, will cover with antibiotics. Cough syrup for night time. F/U with PCP and/or oncologist if does not improve. Mucinex instructed as well.     Bjorn Pippin, [name] 12/06/13 1824

## 2013-12-06 NOTE — Discharge Instructions (Signed)
° °  Follow up with PCP if you should worsen. Use MUCINEX over the counter as directed for the next 3-5 days. Be sure you take with 4OZ of water. Use the cough syrup at night if needed for cough. Complete all antibiotics. Feel better.

## 2014-02-27 ENCOUNTER — Telehealth: Payer: Self-pay | Admitting: Cardiovascular Disease

## 2014-02-27 NOTE — Telephone Encounter (Signed)
Please call,concerning his blood level,it was low.He saw his  family doctor last week.He used to be on Digoxin.

## 2014-02-27 NOTE — Telephone Encounter (Signed)
Spoke with pt's daughter. Digoxin was d/ced and they were recently seen by PCP. Digoxin level was checked and read at 0.2. This medication was d/ced by Dr. Claiborne Billings +1 year ago and pt was concerned about value. Explained that value being low was not a concern as that should be expected with the pt not taking it. Stated to pt's daughter we would be concerned with an elevation in the value with or without the medication. Pt has appt with Dr. Claiborne Billings 04/08/14 and daughter will discuss any med changes then.

## 2014-04-03 ENCOUNTER — Other Ambulatory Visit: Payer: Self-pay | Admitting: Cardiovascular Disease

## 2014-04-04 NOTE — Telephone Encounter (Signed)
Rx(s) sent to pharmacy electronically.  

## 2014-04-08 ENCOUNTER — Encounter: Payer: Self-pay | Admitting: Cardiovascular Disease

## 2014-04-08 ENCOUNTER — Ambulatory Visit (INDEPENDENT_AMBULATORY_CARE_PROVIDER_SITE_OTHER): Payer: Medicare Other | Admitting: Cardiovascular Disease

## 2014-04-08 VITALS — BP 140/80 | HR 55 | Ht 70.0 in | Wt 160.6 lb

## 2014-04-08 DIAGNOSIS — R001 Bradycardia, unspecified: Secondary | ICD-10-CM

## 2014-04-08 DIAGNOSIS — I480 Paroxysmal atrial fibrillation: Secondary | ICD-10-CM

## 2014-04-08 DIAGNOSIS — E785 Hyperlipidemia, unspecified: Secondary | ICD-10-CM

## 2014-04-08 DIAGNOSIS — Z7901 Long term (current) use of anticoagulants: Secondary | ICD-10-CM

## 2014-04-08 MED ORDER — METOPROLOL TARTRATE 25 MG PO TABS: ORAL_TABLET | ORAL | Status: DC

## 2014-04-08 NOTE — Patient Instructions (Signed)
Your physician has recommended you make the following change in your medication: the lopressor has been changed to 1 & 1/2 tablet in the morning and 1 tablet in the evening. A new prescription has been sent to your pharmacy to reflect this change.   Your physician wants you to follow-up in: 1 year or sooner if needed with Dr. Claiborne Billings. You will receive a reminder letter in the mail two months in advance. If you don't receive a letter, please call our office to schedule the follow-up appointment.

## 2014-04-08 NOTE — Progress Notes (Signed)
Patient ID: Gerald [name], male   DOB: Oct 21, 1940, 74 y.o.   MRN: [id number]    MRN: [id number]   HPI: Gerald [name] is a 74 y.o. male who presents for followup cardiology evaluation. I last saw him in October 2014.  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 Between November 2012 in March 2013 he completed 6 cycles of carboplatin, etoposide. He is status post TEE guided cardioversion in 2011. Initially he was treated with Coumadin therapy. He develop recurrent atrial fibrillation in November 2012 and since that time has been on Zrelto therapy.  Over the past year, he is unaware of any breakthrough arrhythmias. He denies any episodes of chest pain.  He is unaware of any cancer recurrence.  He denies chest pain.  He denies PND, orthopnea.  He denies rash.  He denies any significant shortness of breath.  He is here with his son who speaks English to help with translation.   Past Medical History  Diagnosis Date  . A-fib   . Hyperlipidemia   . Small cell lung cancer 12/2010  . Smoking   . CAD (coronary artery disease)     last cath 2008 with noncritical CAD  . Allergy     seasonal allergies  . GERD (gastroesophageal reflux disease)   . Headache 12/17/2012  . Other pancytopenia 04/09/2013  . Unspecified deficiency anemia 04/16/2013  . Leukemia   . 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 Catherization  2008    cardiac catherization    No Known Allergies  Current Outpatient Prescriptions  Medication Sig Dispense Refill  . Cholecalciferol (VITAMIN D-3) 5000 UNITS TABS Take 1 tablet by mouth daily.    . ciprofloxacin (CIPRO) 250 MG tablet Take 1 tablet (250 mg total) by mouth 2 (two) times daily. 60 tablet 1  . HYDROcodone-homatropine (HYCODAN) 5-1.5 MG/5ML syrup Take 5 mLs by mouth every 6 (six)  hours as needed for cough. 120 mL 0  . metoprolol tartrate (LOPRESSOR) 25 MG tablet Take  1 & 1/2 tablet in the morning and 1 tablet in the evening 75 tablet 11  . simvastatin (ZOCOR) 40 MG tablet Take 40 mg by mouth at bedtime.      Alveda Reasons 20 MG TABS tablet TAKE ONE TABLET BY MOUTH ONE TIME DAILY with supper 30 tablet 4   No current facility-administered medications for this visit.    History   Social History  . Marital Status: Married    Spouse Name: N/A    Number of Children: N/A  . Years of Education: N/A   Occupational History  . Not on file.   Social History Main Topics  . Smoking status: Former Smoker -- 1.00 packs/day for 50 years    Types: Cigarettes    Quit date: 03/28/2006  . Smokeless tobacco: Not on file  . Alcohol Use: No  . Drug Use: No  . Sexual Activity: Not on file   Other Topics Concern  . Not on file   Social History Narrative    No family history on file.  Socially he is married has 6 children and 9 grandchildren. Has no recent tobacco use or alcohol use.  ROS General: Negative; No fevers, chills, or night sweats;  HEENT: Negative; No changes in vision or hearing, sinus congestion, difficulty swallowing Pulmonary: Positive for  remote history of small cell CA; No cough, wheezing, shortness of breath, hemoptysis Cardiovascular: Negative; No chest pain, presyncope, syncope, palpitations GI: Negative; No nausea, vomiting, diarrhea, or abdominal pain GU: Negative; No dysuria, hematuria, or difficulty voiding Musculoskeletal: Negative; no myalgias, joint pain, or weakness Hematologic/Oncology: Negative; no easy bruising, bleeding Endocrine: Negative; no heat/cold intolerance; no diabetes Neuro: Negative; no changes in balance, headaches Skin: Negative; No rashes or skin lesions Psychiatric: Negative; No behavioral problems, depression Sleep: Negative; No snoring, daytime sleepiness, hypersomnolence, bruxism, restless legs, hypnogognic  hallucinations, no cataplexy Other comprehensive 14 point system review is negative.    PE BP 140/80 mmHg  Pulse 55  Ht 5\' 10"  (1.778 m)  Wt 160 lb 9.6 oz (72.848 kg)  BMI 23.04 kg/m2  General: Alert, oriented, no distress.  Skin: normal turgor, no rashes HEENT: Normocephalic, atraumatic. Pupils round and reactive; sclera anicteric;no lid lag.  Nose without nasal septal hypertrophy Mouth/Parynx benign; Mallinpatti scale 2 Neck: No JVD, no carotid brutis Lungs: clear to ausculatation and percussion; no wheezing or rales Heart: Regular but bradycardic rhythm at 55, s1 s2 normal 1/6 systolic murmur; no diastolic murmur.  3 gallop.  No rubs thrills or heaves. Abdomen: soft, nontender; no hepatosplenomehaly, BS+; abdominal aorta nontender and not dilated by palpation. Pulses 2+ Extremities: no clubbing cyanosis or edema, Homan's sign negative  Neurologic: grossly nonfocal Psychologic: normal affect and mood.  ECG (independently read by me): Sinus bradycardia 55 bpm.  No significant ST segment changes.  QTc interval 445 ms.  October 2014 ECG: Sinus bradycardia at 47 with mild sinus arrhythmia. QTc interval 403 ms.  LABS:  BMET    Component Value Date/Time   NA 139 04/24/2013 1305   NA 139 09/12/2011 1010   NA 142 06/20/2011 1158   K 4.2 04/24/2013 1305   K 3.7 09/12/2011 1010   K 3.9 06/20/2011 1158   CL 108* 08/14/2012 0958   CL 107 09/12/2011 1010   CL 102 06/20/2011 1158   CO2 27 04/24/2013 1305   CO2 26 09/12/2011 1010   CO2 29 06/20/2011 1158   GLUCOSE 104 04/24/2013 1305   GLUCOSE 101* 08/14/2012 0958   GLUCOSE 100* 09/12/2011 1010   GLUCOSE 99 06/20/2011 1158   BUN 22.4 04/24/2013 1305   BUN 20 09/12/2011 1010   BUN 16 06/20/2011 1158   CREATININE 1.2 04/24/2013 1305   CREATININE 1.18 09/12/2011 1010   CREATININE 1.0 06/20/2011 1158   CALCIUM 9.1 04/24/2013 1305   CALCIUM 8.8 09/12/2011 1010   CALCIUM 8.4 06/20/2011 1158   GFRNONAA 81* 01/24/2011 0155    GFRAA >90 01/24/2011 0155     Hepatic Function Panel     Component Value Date/Time   PROT 7.1 04/24/2013 1305   PROT 6.6 09/12/2011 1010   PROT 7.7 06/20/2011 1158   ALBUMIN 3.9 04/24/2013 1305   ALBUMIN 3.8 09/12/2011 1010   AST 14 04/24/2013 1305   AST 25 09/12/2011 1010   AST 25 06/20/2011 1158   ALT 11 04/24/2013 1305   ALT 28 09/12/2011 1010   ALT 28 06/20/2011 1158   ALKPHOS 69 04/24/2013 1305   ALKPHOS 67 09/12/2011 1010   ALKPHOS 95* 06/20/2011 1158   BILITOT 0.51 04/24/2013 1305   BILITOT 0.3 09/12/2011 1010   BILITOT 0.50 06/20/2011 1158     CBC    Component Value Date/Time   WBC 3.1* 04/24/2013 1305   WBC 7.3 01/26/2011 0515   RBC 2.52* 04/24/2013 1305   RBC 3.79* 01/26/2011 0515  HGB 9.5* 04/24/2013 1305   HGB 12.0* 01/26/2011 0515   HCT 27.2* 04/24/2013 1305   HCT 35.0* 01/26/2011 0515   PLT 25* 04/24/2013 1305   PLT 164 01/26/2011 0515   MCV 107.8* 04/24/2013 1305   MCV 92.3 01/26/2011 0515   MCH 37.5* 04/24/2013 1305   MCH 31.7 01/26/2011 0515   MCHC 34.8 04/24/2013 1305   MCHC 34.3 01/26/2011 0515   RDW 18.7* 04/24/2013 1305   RDW 13.4 01/26/2011 0515   LYMPHSABS 2.0 04/24/2013 1305   LYMPHSABS 1.4 01/24/2011 0155   MONOABS 0.1 04/24/2013 1305   MONOABS 1.0 01/24/2011 0155   EOSABS 0.0 04/24/2013 1305   EOSABS 0.3 01/24/2011 0155   BASOSABS 0.0 04/24/2013 1305   BASOSABS 0.0 01/24/2011 0155     BNP No results found for: PROBNP  Lipid Panel     Component Value Date/Time   CHOL  09/11/2009 0445    121        ATP III CLASSIFICATION:  <200     mg/dL   Desirable  200-239  mg/dL   Borderline High  >=240    mg/dL   High          TRIG 29 09/11/2009 0445   HDL 41 09/11/2009 0445   CHOLHDL 3.0 09/11/2009 0445   VLDL 6 09/11/2009 0445   LDLCALC  09/11/2009 0445    74        Total Cholesterol/HDL:CHD Risk Coronary Heart Disease Risk Table                     Men   Women  1/2 Average Risk   3.4   3.3  Average Risk       5.0   4.4   2 X Average Risk   9.6   7.1  3 X Average Risk  23.4   11.0        Use the calculated Patient Ratio above and the CHD Risk Table to determine the patient's CHD Risk.        ATP III CLASSIFICATION (LDL):  <100     mg/dL   Optimal  100-129  mg/dL   Near or Above                    Optimal  130-159  mg/dL   Borderline  160-189  mg/dL   High  >190     mg/dL   Very High     RADIOLOGY: Dg Chest 2 View  12/17/2012   CLINICAL DATA:  History of small cell lung cancer.  EXAM: CHEST  2 VIEW  COMPARISON:  Chest CT Aug 14, 2012, chest x-ray February 28, 2013  FINDINGS: There is a conglomerate of partial calcified mass in the right apex measuring 1.7 x 2.5 cm. This is not significantly changed in appearance compared to prior chest CT of 2014 coronal images. There is a 6 mm nodule in the lateral right lung base which is not significantly changed compared a prior CT of Aug 14, 2012. There is no focal pneumonia, pulmonary edema, or pleural effusion. Minimal atelectasis of the left lung base is identified. The aorta is tortuous. The heart size is normal. The soft tissues and osseous structures are stable.  IMPRESSION: Conglomerate partial calcified masslike area in the right apex and 6 mm nodule in the lateral right lung base unchanged compared prior CT of May 2014.   Electronically Signed   By: Abelardo Diesel   On: 12/17/2012  14:43   Ct Chest W Contrast  12/31/2012   CLINICAL DATA:  Follow-up small cell lung cancer  EXAM: CT CHEST, ABDOMEN, AND PELVIS WITH CONTRAST  TECHNIQUE: Multidetector CT imaging of the chest, abdomen and pelvis was performed following the standard protocol during bolus administration of intravenous contrast.  CONTRAST:  169mL OMNIPAQUE IOHEXOL 300 MG/ML  SOLN  COMPARISON:  12/19/2011  FINDINGS: CT CHEST FINDINGS  Stable calcified nodularity at the right lung apex (series 4/ image 13) and lateral right middle lobe (series 4/ image 13).  No new/suspicious pulmonary nodules. Mild to moderate  paraseptal emphysematous changes with bullous changes at the right lung apex (series 4/ image 10). No pleural effusion or pneumothorax.  Visualized thyroid is unremarkable.  Mild cardiomegaly. No pericardial effusion. Coronary atherosclerosis. Atherosclerotic calcifications of the aortic arch. Ectasia of the ascending thoracic aorta measuring up to 3.8 cm.  11 mm short axis right hilar node (series 2/ image 24), unchanged. No suspicious mediastinal or axillary lymphadenopathy.  Very mild degenerative changes of the thoracic spine.  CT ABDOMEN AND PELVIS FINDINGS  Liver, spleen, pancreas, and adrenal glands are within normal limits.  Gallbladder is unremarkable. No intrahepatic or extrahepatic ductal dilatation.  Kidneys are within normal limits. No hydronephrosis.  No evidence of bowel obstruction.  Atherosclerotic calcifications of the abdominal aorta and branch vessels.  No abdominopelvic ascites.  No suspicious abdominopelvic lymphadenopathy.  Prostate is unremarkable.  Bladder is within normal limits.  Mild degenerative changes of the lumbar spine.  IMPRESSION: CT CHEST IMPRESSION  No evidence of recurrent or metastatic disease in the chest.  11 mm short axis right hilar node, unchanged.  CT ABDOMEN AND PELVIS IMPRESSION  No evidence of metastatic disease in the abdomen/pelvis.   Electronically Signed   By: Julian Hy M.D.   On: 12/31/2012 14:27   Mr Jeri Cos XN Contrast  12/31/2012   CLINICAL DATA:  74 year old male with new headache. Initial encounter. History of lung cancer. Restaging.  EXAM: MRI HEAD WITHOUT AND WITH CONTRAST  TECHNIQUE: Multiplanar, multiecho pulse sequences of the brain and surrounding structures were obtained according to standard protocol without and with intravenous contrast  CONTRAST:  8mL MULTIHANCE GADOBENATE DIMEGLUMINE 529 MG/ML IV SOLN  COMPARISON:  Brain MRI 02/04/2011.  FINDINGS: No abnormal enhancement identified. No midline shift, mass effect, or evidence of  intracranial mass lesion.  Stable cerebral volume. No restricted diffusion to suggest acute infarction. No ventriculomegaly, extra-axial collection or acute intracranial hemorrhage. Cervicomedullary junction and pituitary are within normal limits. Negative visualized cervical spine. Major intracranial vascular flow voids are stable. Pearline Cables and white matter signal appear stable, with scattered small nonspecific foci of white matter T2 and FLAIR hyperintensity, mild for age. No cortical encephalomalacia.  Visualized orbit soft tissues are within normal limits. Visualized paranasal sinuses and mastoids are clear. Visualized scalp soft tissues are within normal limits. Normal bone marrow signal.  IMPRESSION: No acute or metastatic intracranial abnormality. Stable mild for age nonspecific white matter signal changes.   Electronically Signed   By: Lars Pinks M.D.   On: 12/31/2012 16:55   Ct Abdomen Pelvis W Contrast  12/31/2012   CLINICAL DATA:  Follow-up small cell lung cancer  EXAM: CT CHEST, ABDOMEN, AND PELVIS WITH CONTRAST  TECHNIQUE: Multidetector CT imaging of the chest, abdomen and pelvis was performed following the standard protocol during bolus administration of intravenous contrast.  CONTRAST:  128mL OMNIPAQUE IOHEXOL 300 MG/ML  SOLN  COMPARISON:  12/19/2011  FINDINGS: CT CHEST FINDINGS  Stable calcified nodularity at the right lung apex (series 4/ image 13) and lateral right middle lobe (series 4/ image 13).  No new/suspicious pulmonary nodules. Mild to moderate paraseptal emphysematous changes with bullous changes at the right lung apex (series 4/ image 10). No pleural effusion or pneumothorax.  Visualized thyroid is unremarkable.  Mild cardiomegaly. No pericardial effusion. Coronary atherosclerosis. Atherosclerotic calcifications of the aortic arch. Ectasia of the ascending thoracic aorta measuring up to 3.8 cm.  11 mm short axis right hilar node (series 2/ image 24), unchanged. No suspicious mediastinal or  axillary lymphadenopathy.  Very mild degenerative changes of the thoracic spine.  CT ABDOMEN AND PELVIS FINDINGS  Liver, spleen, pancreas, and adrenal glands are within normal limits.  Gallbladder is unremarkable. No intrahepatic or extrahepatic ductal dilatation.  Kidneys are within normal limits. No hydronephrosis.  No evidence of bowel obstruction.  Atherosclerotic calcifications of the abdominal aorta and branch vessels.  No abdominopelvic ascites.  No suspicious abdominopelvic lymphadenopathy.  Prostate is unremarkable.  Bladder is within normal limits.  Mild degenerative changes of the lumbar spine.  IMPRESSION: CT CHEST IMPRESSION  No evidence of recurrent or metastatic disease in the chest.  11 mm short axis right hilar node, unchanged.  CT ABDOMEN AND PELVIS IMPRESSION  No evidence of metastatic disease in the abdomen/pelvis.   Electronically Signed   By: Julian Hy M.D.   On: 12/31/2012 14:27    ASSESSMENT AND PLAN:  Mr.Stage is a 74 year old [place] gentleman who has a history of paroxysmal atrial fibrillation. He is currently maintaining sinus rhythm on his current dose of metoprolol, tartrate 37.5 mg twice a day.  An echo Doppler study in October 2012 showed an ejection fraction of 65-70%. He is on Xarelto for anticoagulation.  When I saw him in 2014.  I weaned and discontinued his Lanoxin.  His ventricular rate is now 55.  I have suggested that he can further slightly reduce his metoprolol, tartrate and take 37.5 mg in the morning and 25 mg in the evening.  He is continuing to take simvastatin 40 mg for hyperlipidemia.  He has not had recent laboratory.  Blood work will be obtained in the fasting state.  We will contact him regarding results and adjustments to his medical regimen are necessary.  As long as he remains stable, I'll see him in one year for reevaluation.  Time spent: 25 minutes  Troy Sine, MD, [facility]  04/08/2014 5:11 PM

## 2014-04-09 ENCOUNTER — Encounter: Payer: Self-pay | Admitting: Cardiovascular Disease

## 2014-04-10 ENCOUNTER — Encounter: Payer: Self-pay | Admitting: Cardiovascular Disease

## 2014-04-10 DIAGNOSIS — I480 Paroxysmal atrial fibrillation: Secondary | ICD-10-CM | POA: Insufficient documentation

## 2014-04-10 DIAGNOSIS — R001 Bradycardia, unspecified: Secondary | ICD-10-CM | POA: Insufficient documentation

## 2014-06-24 ENCOUNTER — Other Ambulatory Visit: Payer: Self-pay | Admitting: Cardiovascular Disease

## 2014-06-24 NOTE — Telephone Encounter (Signed)
Rx refill sent to patient pharmacy   

## 2014-08-22 ENCOUNTER — Telehealth: Payer: Self-pay | Admitting: Cardiovascular Disease

## 2014-08-22 NOTE — Telephone Encounter (Signed)
Pt need his Xarelto today please. Please call this in today.

## 2014-08-22 NOTE — Telephone Encounter (Signed)
[name] is calling from [facility] to follow up on a Prior Auth for Xarelto for [name] .Marland Kitchen Please call    Thanks

## 2014-09-03 ENCOUNTER — Encounter: Payer: Self-pay | Admitting: *Deleted

## 2014-09-08 ENCOUNTER — Telehealth: Payer: Self-pay | Admitting: *Deleted

## 2014-09-08 NOTE — Telephone Encounter (Signed)
Called [facility] and recieved PA for xarelto 20 mg approval given starting today through to 09/08/15 [facility] [place]  Notified.

## 2014-10-06 ENCOUNTER — Encounter: Payer: Self-pay | Admitting: Cardiology

## 2014-10-06 ENCOUNTER — Encounter: Payer: Self-pay | Admitting: Cardiovascular Disease

## 2015-02-12 ENCOUNTER — Encounter: Payer: Self-pay | Admitting: Cardiovascular Disease

## 2015-03-02 ENCOUNTER — Telehealth: Payer: Self-pay

## 2015-03-02 MED ORDER — METOPROLOL TARTRATE 25 MG PO TABS: ORAL_TABLET | ORAL | Status: DC

## 2015-03-02 NOTE — Telephone Encounter (Signed)
Rx(s) sent to pharmacy electronically.  

## 2015-03-20 ENCOUNTER — Ambulatory Visit: Payer: Medicare Other | Admitting: Cardiovascular Disease

## 2015-03-20 ENCOUNTER — Ambulatory Visit (INDEPENDENT_AMBULATORY_CARE_PROVIDER_SITE_OTHER): Payer: Medicare Other | Admitting: Cardiology

## 2015-03-20 ENCOUNTER — Encounter: Payer: Self-pay | Admitting: Cardiology

## 2015-03-20 VITALS — BP 116/70 | HR 53 | Ht 69.0 in | Wt 165.5 lb

## 2015-03-20 DIAGNOSIS — Z7901 Long term (current) use of anticoagulants: Secondary | ICD-10-CM | POA: Diagnosis not present

## 2015-03-20 DIAGNOSIS — I480 Paroxysmal atrial fibrillation: Secondary | ICD-10-CM

## 2015-03-20 DIAGNOSIS — C3490 Malignant neoplasm of unspecified part of unspecified bronchus or lung: Secondary | ICD-10-CM

## 2015-03-20 DIAGNOSIS — R001 Bradycardia, unspecified: Secondary | ICD-10-CM

## 2015-03-20 DIAGNOSIS — Z0389 Encounter for observation for other suspected diseases and conditions ruled out: Secondary | ICD-10-CM

## 2015-03-20 DIAGNOSIS — E785 Hyperlipidemia, unspecified: Secondary | ICD-10-CM

## 2015-03-20 DIAGNOSIS — IMO0001 Reserved for inherently not codable concepts without codable children: Secondary | ICD-10-CM

## 2015-03-20 LAB — CBC WITH DIFFERENTIAL/PLATELET
Basophils Absolute: 0.1 10*3/uL (ref 0.0–0.1)
Basophils Relative: 1 % (ref 0–1)
Eosinophils Absolute: 0.5 10*3/uL (ref 0.0–0.7)
Eosinophils Relative: 8 % — ABNORMAL HIGH (ref 0–5)
HCT: 40.5 % (ref 39.0–52.0)
Hemoglobin: 14 g/dL (ref 13.0–17.0)
Lymphocytes Relative: 35 % (ref 12–46)
Lymphs Abs: 2.3 10*3/uL (ref 0.7–4.0)
MCH: 34.4 pg — ABNORMAL HIGH (ref 26.0–34.0)
MCHC: 34.6 g/dL (ref 30.0–36.0)
MCV: 99.5 fL (ref 78.0–100.0)
MPV: 10.6 fL (ref 8.6–12.4)
Monocytes Absolute: 0.8 10*3/uL (ref 0.1–1.0)
Monocytes Relative: 12 % (ref 3–12)
Neutro Abs: 2.9 10*3/uL (ref 1.7–7.7)
Neutrophils Relative %: 44 % (ref 43–77)
Platelets: 181 10*3/uL (ref 150–400)
RBC: 4.07 MIL/uL — ABNORMAL LOW (ref 4.22–5.81)
RDW: 13.9 % (ref 11.5–15.5)
WBC: 6.6 10*3/uL (ref 4.0–10.5)

## 2015-03-20 LAB — COMPREHENSIVE METABOLIC PANEL
ALT: 30 U/L (ref 9–46)
AST: 23 U/L (ref 10–35)
Albumin: 4 g/dL (ref 3.6–5.1)
Alkaline Phosphatase: 64 U/L (ref 40–115)
BUN: 15 mg/dL (ref 7–25)
CO2: 25 mmol/L (ref 20–31)
Calcium: 9.2 mg/dL (ref 8.6–10.3)
Chloride: 104 mmol/L (ref 98–110)
Creat: 1.07 mg/dL (ref 0.70–1.18)
Glucose, Bld: 84 mg/dL (ref 65–99)
Potassium: 4.1 mmol/L (ref 3.5–5.3)
Sodium: 139 mmol/L (ref 135–146)
Total Bilirubin: 0.4 mg/dL (ref 0.2–1.2)
Total Protein: 7 g/dL (ref 6.1–8.1)

## 2015-03-20 LAB — TSH: TSH: 2.342 u[IU]/mL (ref 0.350–4.500)

## 2015-03-20 MED ORDER — RIVAROXABAN 20 MG PO TABS: ORAL_TABLET | ORAL | Status: DC

## 2015-03-20 MED ORDER — METOPROLOL TARTRATE 25 MG PO TABS
ORAL_TABLET | ORAL | Status: DC
Start: 2015-03-20 — End: 2016-03-23

## 2015-03-20 MED ORDER — SIMVASTATIN 40 MG PO TABS: 40.0000 mg | ORAL_TABLET | Freq: Every day | ORAL | Status: DC

## 2015-03-20 NOTE — Patient Instructions (Signed)
Medication Instructions:  Your physician has recommended you make the following change in your medication:  1) DECREASE Take ONE Lopressor 25 mg by mouth TWICE daily    Labwork: Your physician recommends that you return for lab work in: TODAY (cmet, cbc, tsh) The lab can be found on the FIRST FLOOR of out building in [address]   Testing/Procedures: none  Follow-Up: Your physician wants you to follow-up in: 12 months with Dr. Claiborne Billings. You will receive a reminder letter in the mail two months in advance. If you don't receive a letter, please call our office to schedule the follow-up appointment.   Any Other Special Instructions Will Be Listed Below (If Applicable).     If you need a refill on your cardiac medications before your next appointment, please call your pharmacy.

## 2015-03-20 NOTE — Assessment & Plan Note (Signed)
Followed by PCP

## 2015-03-20 NOTE — Progress Notes (Signed)
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 Between November 2012 in March 2013 he completed 6 cycles of carboplatin, etoposide. He is status post TEE guided cardioversion in 2011. Initially he was treated with Coumadin therapy. He develop recurrent atrial fibrillation in November 2012 and since that time has been on Zrelto therapy. He is in the office today for follow up. He denies any problems since he was last seen. He specifically denies syncope, weakness, or dyspnea. He apparently saw his PCP recently and was put on Doxycycline and Cipro as a prophylactic measure for malaria and diarrheal illness prior to planned travel to [place] in [place].    Current Outpatient Prescriptions  Medication Sig Dispense Refill  . Cholecalciferol (VITAMIN D-3) 5000 UNITS TABS Take 1 tablet by mouth daily.    . ciprofloxacin (CIPRO) 250 MG tablet Take 1 tablet (250 mg total) by mouth 2 (two) times daily. 60 tablet 1  . HYDROcodone-homatropine (HYCODAN) 5-1.5 MG/5ML syrup Take 5 mLs by mouth every 6 (six) hours as needed for cough. 120 mL 0  . metoprolol tartrate (LOPRESSOR) 25 MG tablet Take 1.5 tablets (37.5 mg total) by mouth every morning and take 1 tablet (25 mg total) by mouth every evening. 225 tablet 0  . simvastatin (ZOCOR) 40 MG tablet Take 40 mg by mouth at bedtime.      Alveda Reasons 20 MG TABS tablet TAKE ONE TABLET BY MOUTH ONE TIME DAILY with supper 30 tablet 10   No current facility-administered medications for this visit.    No Known Allergies  Social History   Social History  . Marital Status: Married    Spouse Name: N/A  . Number of Children: N/A  . Years of Education: N/A   Occupational History  . Not on file.   Social  History Main Topics  . Smoking status: Former Smoker -- 1.00 packs/day for 50 years    Types: Cigarettes    Quit date: 03/28/2006  . Smokeless tobacco: Not on file  . Alcohol Use: No  . Drug Use: No  . Sexual Activity: Not on file   Other Topics Concern  . Not on file   Social History Narrative     Review of Systems: General: negative for chills, fever, night sweats or weight changes.  Cardiovascular: negative for chest pain, dyspnea on exertion, edema, orthopnea, palpitations, paroxysmal nocturnal dyspnea or shortness of breath Dermatological: negative for rash Respiratory: negative for cough or wheezing Urologic: negative for hematuria Abdominal: negative for nausea, vomiting, diarrhea, bright red blood per rectum, melena, or hematemesis Neurologic: negative for visual changes, syncope, or dizziness All other systems reviewed and are otherwise negative except as noted above.    Blood pressure 116/70, pulse 53, height '5\' 9"'$  (1.753 m), weight 165 lb 8 oz (75.07 kg).  General appearance: alert, cooperative and no distress Neck: no carotid bruit and no JVD Lungs: clear to auscultation bilaterally Heart: regular rate and rhythm and slow rate Extremities: extremities normal, atraumatic, no cyanosis or edema Skin: Skin color, texture, turgor normal. No rashes or lesions Neurologic: Grossly normal  EKG NSR, SB- 52  ASSESSMENT AND PLAN:   Paroxysmal atrial fibrillation Holding NSR on beta blocker alone  Hyperlipidemia Followed by PCP  Chronic anticoagulation Xarelto  Small  cell lung cancer Chemo 2013  Sinus bradycardia Asymptomatic- HR 52   PLAN  Decrease Lopressor to 25 mg BID. Check labs, f/u one year.   [name] K [name] 03/20/2015 9:47 AM

## 2015-03-20 NOTE — Assessment & Plan Note (Signed)
Chemo 2013

## 2015-03-20 NOTE — Assessment & Plan Note (Signed)
Holding NSR on beta blocker alone

## 2015-03-20 NOTE — Assessment & Plan Note (Signed)
Xarelto

## 2015-03-20 NOTE — Assessment & Plan Note (Signed)
Asymptomatic- HR 52

## 2015-07-24 ENCOUNTER — Telehealth: Payer: Self-pay | Admitting: Cardiovascular Disease

## 2015-07-24 NOTE — Telephone Encounter (Signed)
New message  1. What dental office are you calling from? [facility]  2. What is your office phone and fax number? [phone]/JJK [phone]  3. What type of procedure is the patient having performed? Oral extraction  4. What date is procedure scheduled? Can sch until clear med.  5. What is your question (ex. Antibiotics prior to procedure, holding medication-we need to know how long dentist wants pt to hold med)? xarleto

## 2015-07-24 NOTE — Telephone Encounter (Signed)
Returned call. Clearance request is for extraction of 2 teeth. Dentist will want patient off Xarelto for procedure. Routed to Dr. Claiborne Billings for [place].

## 2015-07-28 NOTE — Telephone Encounter (Signed)
Pt called back to dentist office today. Having a lot of pain - they still won't clear for extraction w/o OK from our office (want to interrupt Xarelto).

## 2015-07-28 NOTE — Telephone Encounter (Signed)
Letter for clearance to hold Xarelto faxed to dental office on 07/28/2015.

## 2015-08-02 NOTE — Telephone Encounter (Signed)
ok 

## 2015-10-06 ENCOUNTER — Telehealth: Payer: Self-pay | Admitting: *Deleted

## 2015-10-06 NOTE — Telephone Encounter (Signed)
Per call from patients daughter, Gerald [name], patient is out of xarelto as it needs a PA. The pharmacy informed her that this still not been completed. Daughter can be reached at [phone]. Thanks, MI

## 2015-10-06 NOTE — Telephone Encounter (Signed)
Returned call to patient's daughter, ok per DPR.  Medication Samples have been left at the front desk for family member to pick up and use until prior auth is complete. Drug name: Xarelto       Strength: 20 mg        Qty: 28 tablets (4 bottles)  LOT: '16MG'$ [id number]  Exp.Date: 11/2017  Dosing instructions: Xarelto 20 mg, 1 tablet by mouth daily.  Roney Jaffe 2:45 PM 10/06/2015

## 2015-10-06 NOTE — Telephone Encounter (Signed)
[facility]ed [facility] to get the number to call to begin prior auth. Need to call [phone] to request prior auth.

## 2015-10-08 NOTE — Telephone Encounter (Signed)
Called patient's insurance  @ number provided above. Xarelto approved through to December 31st. Patient's daughter and pharmacy notified.

## 2016-03-22 ENCOUNTER — Telehealth: Payer: Self-pay | Admitting: Cardiovascular Disease

## 2016-03-22 NOTE — Telephone Encounter (Signed)
New message      Daughter states that the pt was seen at his PCP's office and they suggested he see his cardiologist today.  Daughter is not with pt at this time, but she said she knows all of the information and need to talk to a nurse

## 2016-03-22 NOTE — Telephone Encounter (Signed)
Received call from daughter (ok per DPR). Reports patient had a cold 2 weeks ago with cough and was started on abx-cough has continued but patient overall feels better.  Patient started having chest tightness last night, went to PCP today and was informed to call office for appointment, unable to determine if pain is coming from cough or is cardiac related.  Daughter reports that PCP gave them prescription for NTG today.  Reports chest pain is mild and kept patient awake last night.    Daughter is not with patient at [place] is with brother who does not speak english, therefore I am unable to assess patient directly.    Advised to get NTG filled today (advised how to take NTG), appointment made for tomorrow morning at 9:30 with Almyra Deforest PA.  Advised if pain continues or increases, proceed to ER for evaluation.  Daughter verbalized understanding.

## 2016-03-22 NOTE — Progress Notes (Signed)
Cardiology Office Note    Date:  03/23/2016   ID:  Devonn, Giampietro 1941/01/27, MRN [id number]  PCP:  Gara Kroner, MD  Cardiologist:  Dr. Claiborne Billings   Chief Complaint  Patient presents with  . Follow-up    seen for Dr. Claiborne Billings, evaluation for chest pain    History of Present Illness:  Kaidon Rog is a 75 y.o. [place] male with PMH of paroxysmal atrial fibrillation, nonobstructive CAD, GERD, small cell carcinoma of lung completed 6 cycles of carboplatin and etoposide between November 2012 and March 2013. He underwent TEE guided cardioversion in 2011. He was treated with Coumadin therapy. He developed recurrent atrial fibrillation in November 2012 and has since been switched to Xarelto therapy. His last office visit was on 03/20/2015, he did have some asymptomatic sinus bradycardia with heart rate 52. Otherwise he was doing well. His Lopressor was decreased to 25 mg twice a day. One year follow-up was recommended. More remotely, it appears he had a cardiac catheterization on 05/20/2005 and again on 01/12/2007. In 2007, he did not have significant coronary artery disease, EF was 60%. Cardiac catheterization in 2008 did show nonobstructive CAD with eccentric 30-40% narrowing in mid RCA, 40% proximal LAD lesion, EF 60%.  Apparently, patient woke up around 1 AM in the morning of 03/21/2016 with chest pressure. It was not affected by deep inspiration, body rotation or palpation. It lasted a total of 6 hours and eventually went away by 7 AM. He says he occasionally still have some residual chest discomfort. He has not tried to exert himself lately. He usually walk with a cane. He was seen by his primary care physician on the following day who asked him to follow-up with cardiology service. He had nitroglycerin refilled. Given the prolonged episode of chest discomfort, we did repeat EKG today which did not show any significant ischemia. However based on the records sent over by [facility] physician, his EKG  obtained yesterday did show T wave inversion in V4 through V6. T-wave inversions appears to have resolved on today's EKG in the cardiology office. Also his heart rate today is 48 bpm. I plan to decrease his metoprolol to 12.5 mg twice a day while adding amlodipine 5 mg daily to his medical regimen. Also with the prolonged episode chest pain, I will obtain a stat troponin today, if negative, he can proceed with Lexiscan Myoview. I do not think he can keep up with the treadmill. Of course his stat troponin come back positive, we will contact the patient early. If workups come back negative, he can follow-up with cardiology service in 1 year.  Note patient could not speak [place] and his son acted as Optometrist during our visit.   Past Medical History:  Diagnosis Date  . A-fib ([place])   . Allergy    seasonal allergies  . 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 CAD (coronary artery disease)    last cath 2008 with noncritical CAD  . GERD (gastroesophageal reflux disease)   . Headache 12/17/2012  . Hyperlipidemia   . Leukemia ([place])   . Other pancytopenia ([place]) 04/09/2013  . Small cell lung cancer ([place]) 12/2010  . Smoking   . Unspecified deficiency anemia 04/16/2013    Past Surgical History:  Procedure Laterality Date  . CARDIAC CATHETERIZATION  01/12/2007   noncritical CAD, no change from cath of 05/20/2005. 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  Marland Kitchen NM MYOCAR PERF WALL MOTION  01/23/2007  mild perfusion defect seen in the basal inferior and mid inferior regions - c/w attenuation defect. No ischemia present.    Current Medications: Outpatient Medications Prior to Visit  Medication Sig Dispense Refill  . Cholecalciferol (VITAMIN D-3) 5000 UNITS TABS Take 1 tablet by mouth daily.    . ciprofloxacin (CIPRO) 250 MG tablet Take 1 tablet (250 mg total) by mouth 2 (two) times daily. 60 tablet 1  . rivaroxaban (XARELTO) 20 MG TABS tablet TAKE  ONE TABLET BY MOUTH ONE TIME DAILY with supper 90 tablet 3  . simvastatin (ZOCOR) 40 MG tablet Take 1 tablet (40 mg total) by mouth at bedtime. 90 tablet 3  . metoprolol tartrate (LOPRESSOR) 25 MG tablet Take one 25 mg tablet by mouth TWICE daily. 180 tablet 3  . HYDROcodone-homatropine (HYCODAN) 5-1.5 MG/5ML syrup Take 5 mLs by mouth every 6 (six) hours as needed for cough. 120 mL 0   No facility-administered medications prior to visit.      Allergies:   Patient has no known allergies.   Social History   Social History  . Marital status: Married    Spouse name: N/A  . Number of children: N/A  . Years of education: N/A   Social History Main Topics  . Smoking status: Former Smoker    Packs/day: 1.00    Years: 50.00    Types: Cigarettes    Quit date: 03/28/2006  . Smokeless tobacco: None  . Alcohol use No  . Drug use: No  . Sexual activity: Not Asked   Other Topics Concern  . None   Social History Narrative  . None     Family History:  The patient's family history is not on file.   ROS:   Please see the history of present illness.    ROS All other systems reviewed and are negative.   PHYSICAL EXAM:   VS:  BP 140/72   Pulse (!) 48   Ht '5\' 9"'$  (1.753 m)   Wt 162 lb (73.5 kg)   BMI 23.92 kg/m    GEN: Well nourished, well developed, in no acute distress  HEENT: normal  Neck: no JVD, carotid bruits, or masses Cardiac: RRR; no murmurs, rubs, or gallops,no edema  Respiratory:  clear to auscultation bilaterally, normal work of breathing GI: soft, nontender, nondistended, + BS MS: no deformity or atrophy  Skin: warm and dry, no rash Neuro:  Alert and Oriented x 3, Strength and sensation are intact Psych: euthymic mood, full affect  Wt Readings from Last 3 Encounters:  03/23/16 162 lb (73.5 kg)  03/20/15 165 lb 8 oz (75.1 kg)  04/08/14 160 lb 9.6 oz (72.8 kg)      Studies/Labs Reviewed:   EKG:  EKG is ordered today.  The ekg ordered today demonstrates Sinus  bradycardia with heart rate 48, no significant ST-T wave changes.  Recent Labs: No results found for requested labs within last 8760 hours.   Lipid Panel    Component Value Date/Time   CHOL  09/11/2009 0445    121        ATP III CLASSIFICATION:  <200     mg/dL   Desirable  200-239  mg/dL   Borderline High  >=240    mg/dL   High          TRIG 29 09/11/2009 0445   HDL 41 09/11/2009 0445   CHOLHDL 3.0 09/11/2009 0445   VLDL 6 09/11/2009 0445   LDLCALC  09/11/2009 0445  74        Total Cholesterol/HDL:CHD Risk Coronary Heart Disease Risk Table                     Men   Women  1/2 Average Risk   3.4   3.3  Average Risk       5.0   4.4  2 X Average Risk   9.6   7.1  3 X Average Risk  23.4   11.0        Use the calculated Patient Ratio above and the CHD Risk Table to determine the patient's CHD Risk.        ATP III CLASSIFICATION (LDL):  <100     mg/dL   Optimal  100-129  mg/dL   Near or Above                    Optimal  130-159  mg/dL   Borderline  160-189  mg/dL   High  >190     mg/dL   Very High    Additional studies/ records that were reviewed today include:   Cath 05/20/2005 CONCLUSIONS:  1.  No significant coronary artery disease.  2.  Normal left ventricular systolic function.  3.  Successful closure of the right groin with a StarClose device with 1 g      of Ancef given prophylactically.   Cath 01/12/2007  CATHETERIZATION DIAGNOSES:  1. Chest pain, etiology not determined.  2. Noncritical coronary disease with no significant change from prior      catheterization May 20, 2005, with normal left ventricular      function.  3. 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  4. Transient paroxysmal atrial fibrillation on prior admission      February 2007.  5. Hyperlipidemia.  6. Possible gastroesophageal reflux disease.  7. Past cigarette abuse, discontinued 1 to 2 years ago.   ASSESSMENT:    1.  Chest pain, unspecified type   2. PAF (paroxysmal atrial fibrillation) ([place])   3. Coronary artery disease involving native coronary artery of native heart with other form of angina pectoris ([place])   4. Small cell carcinoma (HCC)      PLAN:  In order of problems listed above:  1. Chest pain: He had one prolonged episode on Monday morning, he says he his chest is still sore this time. Will obtain troponin, if negative he can proceed with Lexiscan Myoview. He walks with a walker, I do not think he can keep up with the treadmill.  2. PAF: Currently maintaining sinus rhythm, history of paroxysmal atrial fibrillation controlled on metoprolol. His heart rate is quite slow despite the fact that his metoprolol was decreased to 25 twice a day during the last visit. I will decrease it further to 12.5 mg twice a day since his heart rate at this time is 48 bpm at rest. He is on Xarelto 20 mg daily. During the meantime, I will also add amlodipine 5 mg daily to help with his blood pressure and also offer antianginal purposes.  3. History of nonobstructive CAD: See above, last cardiac catheterization in 2007 and 2008 both shows nonobstructive CAD  4. Small cell carcinoma: Underwent chemotherapy    Medication Adjustments/Labs and Tests Ordered: Current medicines are reviewed at length with the patient today.  Concerns regarding medicines are outlined above.  Medication changes, Labs and Tests ordered today are listed in  the Patient Instructions below. Patient Instructions  Medication Instructions:  1. DECREASE METOPROLOL TARTRATE TO 12.5 MG TWICE DAILY (THIS WILL BE 1/2 TAB TWICE DAILY)  2. START AMLODIPINE 5 MG DAILY; RX SENT  Labwork: STAT TROPONIN TODAY   Testing/Procedures: 1. Your physician has requested that you have a lexiscan myoview. For further information please visit [email or web address]. Please follow instruction sheet, as given.   Follow-Up: Your physician wants you to follow-up in: [place]. You will receive a reminder letter in the mail two months in advance. If you don't receive a letter, please call our office to schedule the follow-up appointment.   Any Other Special Instructions Will Be Listed Below (If Applicable).     If you need a refill on your cardiac medications before your next appointment, please call your pharmacy.      Hilbert Corrigan, [place]  03/23/2016 10:14 PM    [facility] [place], [place], [place]  [address] Phone: [phone]; Fax: [phone]

## 2016-03-23 ENCOUNTER — Ambulatory Visit (INDEPENDENT_AMBULATORY_CARE_PROVIDER_SITE_OTHER): Payer: Medicare Other | Admitting: Physician Assistant

## 2016-03-23 ENCOUNTER — Telehealth: Payer: Self-pay | Admitting: *Deleted

## 2016-03-23 ENCOUNTER — Telehealth: Payer: Self-pay | Admitting: Physician Assistant

## 2016-03-23 ENCOUNTER — Encounter: Payer: Self-pay | Admitting: Physician Assistant

## 2016-03-23 VITALS — BP 140/72 | HR 48 | Ht 69.0 in | Wt 162.0 lb

## 2016-03-23 DIAGNOSIS — R079 Chest pain, unspecified: Secondary | ICD-10-CM | POA: Diagnosis not present

## 2016-03-23 DIAGNOSIS — I480 Paroxysmal atrial fibrillation: Secondary | ICD-10-CM | POA: Diagnosis not present

## 2016-03-23 DIAGNOSIS — I25118 Atherosclerotic heart disease of native coronary artery with other forms of angina pectoris: Secondary | ICD-10-CM | POA: Diagnosis not present

## 2016-03-23 DIAGNOSIS — C801 Malignant (primary) neoplasm, unspecified: Secondary | ICD-10-CM | POA: Diagnosis not present

## 2016-03-23 LAB — TROPONIN I: Troponin I: 0.05 ng/mL (ref <=0.05)

## 2016-03-23 MED ORDER — AMLODIPINE BESYLATE 5 MG PO TABS: 5.0000 mg | ORAL_TABLET | Freq: Every day | ORAL | 3 refills | Status: DC

## 2016-03-23 MED ORDER — METOPROLOL TARTRATE 25 MG PO TABS
12.5000 mg | ORAL_TABLET | Freq: Two times a day (BID) | ORAL | 3 refills | Status: DC
Start: 2016-03-23 — End: 2018-05-07

## 2016-03-23 NOTE — Telephone Encounter (Signed)
New message       Calling to give stat lab report

## 2016-03-23 NOTE — Patient Instructions (Signed)
Medication Instructions:  1. DECREASE METOPROLOL TARTRATE TO 12.5 MG TWICE DAILY (THIS WILL BE 1/2 TAB TWICE DAILY)  2. START AMLODIPINE 5 MG DAILY; RX SENT  Labwork: STAT TROPONIN TODAY   Testing/Procedures: 1. Your physician has requested that you have a lexiscan myoview. For further information please visit [email or web address]. Please follow instruction sheet, as given.   Follow-Up: Your physician wants you to follow-up in: [place]. You will receive a reminder letter in the mail two months in advance. If you don't receive a letter, please call our office to schedule the follow-up appointment.   Any Other Special Instructions Will Be Listed Below (If Applicable).     If you need a refill on your cardiac medications before your next appointment, please call your pharmacy.

## 2016-03-23 NOTE — Telephone Encounter (Signed)
Received records from [place] for appointment on 03/23/16 with Almyra Deforest, PA.  Records given to [employer] (medical records) for Hao's schedule on 03/23/16. lp

## 2016-03-23 NOTE — Telephone Encounter (Signed)
Received call from [facility] lab regarding STAT lab-Troponin 0.05.  Almyra Deforest PA made aware.

## 2016-03-23 NOTE — Telephone Encounter (Signed)
This has been reviewed by Almyra Deforest, PA, who has entered communication in results chart. I have contacted daughter of patient w findings and recommendations.

## 2016-03-24 ENCOUNTER — Telehealth (HOSPITAL_COMMUNITY): Payer: Self-pay

## 2016-03-24 NOTE — Telephone Encounter (Signed)
Encounter complete. 

## 2016-03-29 ENCOUNTER — Ambulatory Visit (HOSPITAL_COMMUNITY)
Admission: RE | Admit: 2016-03-29 | Discharge: 2016-03-29 | Disposition: A | Discharge status: Home / Self Care | Payer: Medicare Other | Source: Ambulatory Visit | Attending: Cardiology | Admitting: Cardiology

## 2016-03-29 DIAGNOSIS — R079 Chest pain, unspecified: Secondary | ICD-10-CM | POA: Diagnosis not present

## 2016-03-29 LAB — MYOCARDIAL PERFUSION IMAGING
LV dias vol: 81 mL (ref 62–150)
LV sys vol: 28 mL
Peak HR: 95 {beats}/min
Rest HR: 52 {beats}/min
SDS: 1
SRS: 4
SSS: 5
TID: 1.17

## 2016-03-29 MED ORDER — TECHNETIUM TC 99M TETROFOSMIN IV KIT
30.6000 | PACK | Freq: Once | INTRAVENOUS | Status: AC | PRN
  Administered 2016-03-29: 30.6 via INTRAVENOUS
  Filled 2016-03-29: qty 31

## 2016-03-29 MED ORDER — TECHNETIUM TC 99M TETROFOSMIN IV KIT
10.7000 | PACK | Freq: Once | INTRAVENOUS | Status: AC | PRN
  Administered 2016-03-29: 10.7 via INTRAVENOUS
  Filled 2016-03-29: qty 11

## 2016-03-29 MED ORDER — REGADENOSON 0.4 MG/5ML IV SOLN
0.4000 mg | Freq: Once | INTRAVENOUS | Status: AC
  Administered 2016-03-29: 0.4 mg via INTRAVENOUS

## 2016-03-29 NOTE — Brief Op Note (Addendum)
Patient requests to use his son as his interpreter instead of one being provided by [facility].  The [facility] interpreter ([facility] [phone]), the patient, and patients son all signed the release form which is scanned into system.    1:20 PM  PATIENT:  Gerald [name]  76 y.o. male  PRE-OPERATIVE DIAGNOSIS:  * No surgery found *  POST-OPERATIVE DIAGNOSIS:  * No surgery found *  PROCEDURE:  * No surgery found *  SURGEON:  * Surgery not found *  PHYSICIAN ASSISTANT:   ASSISTANTS: none   ANESTHESIA:     EBL:  No intake/output data recorded.  BLOOD ADMINISTERED:  DRAINS:    LOCAL MEDICATIONS USED:  NONE  SPECIMEN:    DISPOSITION OF SPECIMEN:    COUNTS:    TOURNIQUET:  * No surgery found *  DICTATION: .  PLAN OF CARE:   PATIENT DISPOSITION:     Delay start of Pharmacological VTE agent (>24hrs) due to surgical blood loss or risk of bleeding: no

## 2016-03-30 ENCOUNTER — Inpatient Hospital Stay (HOSPITAL_COMMUNITY): Admission: RE | Admit: 2016-03-30 | Payer: Medicare Other | Source: Ambulatory Visit

## 2016-04-04 ENCOUNTER — Other Ambulatory Visit: Payer: Self-pay | Admitting: Cardiology

## 2016-04-04 DIAGNOSIS — Z0389 Encounter for observation for other suspected diseases and conditions ruled out: Secondary | ICD-10-CM

## 2016-04-04 DIAGNOSIS — R001 Bradycardia, unspecified: Secondary | ICD-10-CM

## 2016-04-04 DIAGNOSIS — IMO0001 Reserved for inherently not codable concepts without codable children: Secondary | ICD-10-CM

## 2016-04-04 DIAGNOSIS — E785 Hyperlipidemia, unspecified: Secondary | ICD-10-CM

## 2016-04-04 DIAGNOSIS — C3490 Malignant neoplasm of unspecified part of unspecified bronchus or lung: Secondary | ICD-10-CM

## 2016-04-04 DIAGNOSIS — Z7901 Long term (current) use of anticoagulants: Secondary | ICD-10-CM

## 2016-04-04 DIAGNOSIS — I480 Paroxysmal atrial fibrillation: Secondary | ICD-10-CM

## 2016-04-18 ENCOUNTER — Other Ambulatory Visit: Payer: Self-pay | Admitting: Cardiology

## 2016-04-18 DIAGNOSIS — Z0389 Encounter for observation for other suspected diseases and conditions ruled out: Secondary | ICD-10-CM

## 2016-04-18 DIAGNOSIS — Z7901 Long term (current) use of anticoagulants: Secondary | ICD-10-CM

## 2016-04-18 DIAGNOSIS — I480 Paroxysmal atrial fibrillation: Secondary | ICD-10-CM

## 2016-04-18 DIAGNOSIS — R001 Bradycardia, unspecified: Secondary | ICD-10-CM

## 2016-04-18 DIAGNOSIS — E785 Hyperlipidemia, unspecified: Secondary | ICD-10-CM

## 2016-04-18 DIAGNOSIS — IMO0001 Reserved for inherently not codable concepts without codable children: Secondary | ICD-10-CM

## 2016-04-18 DIAGNOSIS — C3490 Malignant neoplasm of unspecified part of unspecified bronchus or lung: Secondary | ICD-10-CM

## 2016-05-20 ENCOUNTER — Other Ambulatory Visit: Payer: Self-pay | Admitting: Cardiovascular Disease

## 2016-05-20 DIAGNOSIS — E785 Hyperlipidemia, unspecified: Secondary | ICD-10-CM

## 2016-05-20 DIAGNOSIS — Z7901 Long term (current) use of anticoagulants: Secondary | ICD-10-CM

## 2016-05-20 DIAGNOSIS — IMO0001 Reserved for inherently not codable concepts without codable children: Secondary | ICD-10-CM

## 2016-05-20 DIAGNOSIS — I480 Paroxysmal atrial fibrillation: Secondary | ICD-10-CM

## 2016-05-20 DIAGNOSIS — Z0389 Encounter for observation for other suspected diseases and conditions ruled out: Secondary | ICD-10-CM

## 2016-05-20 DIAGNOSIS — C3490 Malignant neoplasm of unspecified part of unspecified bronchus or lung: Secondary | ICD-10-CM

## 2016-05-20 DIAGNOSIS — R001 Bradycardia, unspecified: Secondary | ICD-10-CM

## 2016-06-07 ENCOUNTER — Telehealth: Payer: Self-pay | Admitting: Cardiovascular Disease

## 2016-06-07 NOTE — Telephone Encounter (Addendum)
Called Dr. Iva Boop aware that it is not recommended to hold anticoag for a single extraction.  Office aware and verbalized understanding.    Routed to MD for review.

## 2016-06-07 NOTE — Telephone Encounter (Signed)
New message   Request for surgical clearance:  1. What type of surgery is being performed? Tooth pulled  2. When is this surgery scheduled? 06/14/16  3. Are there any medications that need to be held prior to surgery and how long? XARELTO 20 MG TABS tablet  4. Name of physician performing surgery? Dr. Lourdes Sledge office on [address] [place] pt states that they told him to give [place] a call because they do not request the surgery clearance anymore for patients and that we can give them a call.  5. What is your office phone and fax number? [phone]

## 2016-12-17 ENCOUNTER — Other Ambulatory Visit: Payer: Self-pay | Admitting: Cardiovascular Disease

## 2016-12-17 DIAGNOSIS — IMO0001 Reserved for inherently not codable concepts without codable children: Secondary | ICD-10-CM

## 2016-12-17 DIAGNOSIS — R001 Bradycardia, unspecified: Secondary | ICD-10-CM

## 2016-12-17 DIAGNOSIS — C3490 Malignant neoplasm of unspecified part of unspecified bronchus or lung: Secondary | ICD-10-CM

## 2016-12-17 DIAGNOSIS — Z0389 Encounter for observation for other suspected diseases and conditions ruled out: Secondary | ICD-10-CM

## 2016-12-17 DIAGNOSIS — Z7901 Long term (current) use of anticoagulants: Secondary | ICD-10-CM

## 2016-12-17 DIAGNOSIS — E785 Hyperlipidemia, unspecified: Secondary | ICD-10-CM

## 2016-12-17 DIAGNOSIS — I480 Paroxysmal atrial fibrillation: Secondary | ICD-10-CM

## 2017-01-29 ENCOUNTER — Encounter (HOSPITAL_COMMUNITY): Payer: Self-pay | Admitting: Emergency Medicine

## 2017-01-29 ENCOUNTER — Emergency Department (HOSPITAL_COMMUNITY)
Admission: EM | Admit: 2017-01-29 | Discharge: 2017-01-29 | Disposition: A | Discharge status: Home / Self Care | Payer: Medicare Other | Attending: Physician Assistant | Admitting: Physician Assistant

## 2017-01-29 ENCOUNTER — Emergency Department (HOSPITAL_COMMUNITY): Payer: Medicare Other

## 2017-01-29 DIAGNOSIS — R072 Precordial pain: Secondary | ICD-10-CM

## 2017-01-29 DIAGNOSIS — I2510 Atherosclerotic heart disease of native coronary artery without angina pectoris: Secondary | ICD-10-CM | POA: Diagnosis not present

## 2017-01-29 DIAGNOSIS — Z7982 Long term (current) use of aspirin: Secondary | ICD-10-CM | POA: Diagnosis not present

## 2017-01-29 DIAGNOSIS — Z87891 Personal history of nicotine dependence: Secondary | ICD-10-CM | POA: Insufficient documentation

## 2017-01-29 DIAGNOSIS — R0602 Shortness of breath: Secondary | ICD-10-CM | POA: Diagnosis not present

## 2017-01-29 DIAGNOSIS — R079 Chest pain, unspecified: Secondary | ICD-10-CM | POA: Diagnosis present

## 2017-01-29 DIAGNOSIS — I4891 Unspecified atrial fibrillation: Secondary | ICD-10-CM | POA: Insufficient documentation

## 2017-01-29 DIAGNOSIS — Z7901 Long term (current) use of anticoagulants: Secondary | ICD-10-CM | POA: Insufficient documentation

## 2017-01-29 DIAGNOSIS — Z85118 Personal history of other malignant neoplasm of bronchus and lung: Secondary | ICD-10-CM | POA: Insufficient documentation

## 2017-01-29 LAB — I-STAT TROPONIN, ED
Troponin i, poc: 0 ng/mL (ref 0.00–0.08)
Troponin i, poc: 0.01 ng/mL (ref 0.00–0.08)

## 2017-01-29 LAB — CBC
HCT: 41.3 % (ref 39.0–52.0)
Hemoglobin: 14.6 g/dL (ref 13.0–17.0)
MCH: 33.6 pg (ref 26.0–34.0)
MCHC: 35.4 g/dL (ref 30.0–36.0)
MCV: 95.2 fL (ref 78.0–100.0)
Platelets: 131 10*3/uL — ABNORMAL LOW (ref 150–400)
RBC: 4.34 MIL/uL (ref 4.22–5.81)
RDW: 12.8 % (ref 11.5–15.5)
WBC: 5.7 10*3/uL (ref 4.0–10.5)

## 2017-01-29 LAB — BASIC METABOLIC PANEL
Anion gap: 11 (ref 5–15)
BUN: 11 mg/dL (ref 6–20)
CO2: 20 mmol/L — ABNORMAL LOW (ref 22–32)
Calcium: 9 mg/dL (ref 8.9–10.3)
Chloride: 105 mmol/L (ref 101–111)
Creatinine, Ser: 1.16 mg/dL (ref 0.61–1.24)
GFR calc Af Amer: >60 mL/min (ref >60)
GFR calc non Af Amer: 59 mL/min — ABNORMAL LOW (ref >60)
Glucose, Bld: 119 mg/dL — ABNORMAL HIGH (ref 65–99)
Potassium: 3.5 mmol/L (ref 3.5–5.1)
Sodium: 136 mmol/L (ref 135–145)

## 2017-01-29 LAB — BRAIN NATRIURETIC PEPTIDE: B Natriuretic Peptide: 175.7 pg/mL — ABNORMAL HIGH (ref 0.0–100.0)

## 2017-01-29 LAB — MAGNESIUM: Magnesium: 1.7 mg/dL (ref 1.7–2.4)

## 2017-01-29 MED ORDER — ETOMIDATE 2 MG/ML IV SOLN
INTRAVENOUS | Status: AC | PRN
  Administered 2017-01-29: 6 mg via INTRAVENOUS

## 2017-01-29 MED ORDER — ETOMIDATE 2 MG/ML IV SOLN
INTRAVENOUS | Status: AC
  Filled 2017-01-29: qty 10

## 2017-01-29 MED ORDER — ETOMIDATE 2 MG/ML IV SOLN
10.0000 mg | Freq: Once | INTRAVENOUS | Status: DC
Start: 2017-01-29 — End: 2017-01-29

## 2017-01-29 NOTE — Discharge Instructions (Signed)
Please read the instructions on conscious sedation, after care.  Please see the doctors at the [facility] as requested. Return to the ER if your symptoms return.

## 2017-01-29 NOTE — ED Notes (Signed)
Pt placed on Zoll per nurse request.

## 2017-01-29 NOTE — ED Provider Notes (Signed)
Patient was cardioverted overnight by previous provider.  Plan was to follow-up with delta troponin.  Troponin is negative.  He appears well, and ambulated without assistance or oxygen.  Patient accompanied by family member there.  Will discharge and have him follow-up as planned.   Macarthur Critchley, MD 01/29/17 [phone]

## 2017-01-29 NOTE — ED Provider Notes (Addendum)
[facility] EMERGENCY DEPARTMENT Provider Note   CSN: [id number] Arrival date & time: 01/29/17  0434     History   Chief Complaint Chief Complaint  Patient presents with  . Atrial Fibrillation  . Chest Pain    HPI Gerald [name] is a 76 y.o. male.  HPI  Patient comes in with chief complaint of chest pain, shortness of breath. Patient has history of A. 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  Patient woke up at 3 AM with sudden onset chest pain, shortness of breath.  Chest pain is described as heaviness and is on the left side.  Patient is having associated shortness of breath which is worse with exertion.  Patient denies any nausea, diaphoresis.  Patient denies any recent infection, medication changes, medication noncompliance.  Patient insisted that we use his nephew for translation purposes and refused the hospital provided interpreter services.   Past Medical History:  Diagnosis Date  . A-fib ([place])   . Allergy    seasonal allergies  . 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 CAD (coronary artery disease)    last cath 2008 with noncritical CAD  . GERD (gastroesophageal reflux disease)   . Headache 12/17/2012  . Hyperlipidemia   . Leukemia ([place])   . Other pancytopenia ([place]) 04/09/2013  . Small cell lung cancer ([place]) 12/2010  . Smoking   . Unspecified deficiency anemia 04/16/2013    Patient Active Problem List   Diagnosis Date Noted  . Paroxysmal atrial fibrillation ([place]) 04/10/2014  . Sinus bradycardia 04/10/2014  . 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 Unspecified deficiency anemia 04/16/2013  . Other pancytopenia ([place]) 04/09/2013  . Chronic anticoagulation 01/09/2013  . Headache 12/17/2012  . Hyperlipidemia   . Smoking   . Normal coronary arteries   . Small cell lung cancer ([place]) 12/27/2010    Past Surgical History:  Procedure Laterality Date  .  CARDIAC CATHETERIZATION  01/12/2007   noncritical CAD, no change from cath of 05/20/2005. 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  Marland Kitchen NM MYOCAR PERF WALL MOTION  01/23/2007   mild perfusion defect seen in the basal inferior and mid inferior regions - c/w attenuation defect. No ischemia present.       Home Medications    Prior to Admission medications   Medication Sig Start Date End Date Taking? Authorizing Provider  aspirin EC 81 MG tablet Take 81 mg daily by mouth.   Yes [provider]  magnesium gluconate (MAGONATE) 500 MG tablet Take 500 mg 2 (two) times daily by mouth.   Yes [provider]  metoprolol tartrate (LOPRESSOR) 25 MG tablet TAKE ONE 25 MG TABLET BY MOUTH TWICE DAILY. 04/18/16  Yes Kilroy, Luke K, [name]  simvastatin (ZOCOR) 40 MG tablet Take 1 tablet (40 mg total) by mouth at bedtime. 03/20/15  Yes Kilroy, Luke K, [name]  XARELTO 20 MG TABS tablet TAKE 1 TABLET BY MOUTH EVERY DAY WITH SUPPER 12/19/16  Yes Troy Sine, MD  amLODipine (NORVASC) 5 MG tablet Take 1 tablet (5 mg total) by mouth daily. Patient not taking: Reported on 01/29/2017 03/23/16 01/30/23  Almyra Deforest, PA  ciprofloxacin (CIPRO) 250 MG tablet Take 1 tablet (250 mg total) by mouth 2 (two) times daily. Patient not taking: Reported on 01/29/2017 04/09/13   Heath Lark, MD  metoprolol tartrate (LOPRESSOR) 25 MG  tablet Take 0.5 tablets (12.5 mg total) by mouth 2 (two) times daily. 03/23/16 06/21/16  Almyra Deforest, PA    Family History No family history on file.  Social History Social History   Tobacco Use  . Smoking status: Former Smoker    Packs/day: 1.00    Years: 50.00    Pack years: 50.00    Types: Cigarettes    Last attempt to quit: 03/28/2006    Years since quitting: 10.8  . Smokeless tobacco: Never Used  Substance Use Topics  . Alcohol use: No  . Drug use: No     Allergies   Patient has no known allergies.   Review of  Systems Review of Systems  Constitutional: Positive for activity change.  Respiratory: Positive for shortness of breath.   Cardiovascular: Positive for chest pain and palpitations.  Neurological: Positive for dizziness.  All other systems reviewed and are negative.    Physical Exam Updated Vital Signs BP 105/75   Pulse (!) 59   Temp 97.6 F (36.4 C) (Oral)   Resp 20   Ht 6\' 1"  (1.854 m)   Wt 73.5 kg (162 lb)   SpO2 100%   BMI 21.37 kg/m   Physical Exam  Constitutional: He is oriented to person, place, and time. He appears well-developed.  HENT:  Head: Atraumatic.  Neck: Neck supple. No JVD present.  Cardiovascular: An irregularly irregular rhythm present.  tachycardia  Pulmonary/Chest: Effort normal. No stridor.  Neurological: He is alert and oriented to person, place, and time.  Skin: Skin is warm.  Nursing note and vitals reviewed.    ED Treatments / Results  Labs (all labs ordered are listed, but only abnormal results are displayed) Labs Reviewed  BASIC METABOLIC PANEL - Abnormal; Notable for the following components:      Result Value   CO2 20 (*)    Glucose, Bld 119 (*)    GFR calc non Af Amer 59 (*)    All other components within normal limits  CBC - Abnormal; Notable for the following components:   Platelets 131 (*)    All other components within normal limits  BRAIN NATRIURETIC PEPTIDE - Abnormal; Notable for the following components:   B Natriuretic Peptide 175.7 (*)    All other components within normal limits  MAGNESIUM  I-STAT TROPONIN, ED  I-STAT TROPONIN, ED    EKG  EKG Interpretation  Date/Time:  Sunday January 29 2017 04:35:21 EST Ventricular Rate:  117 PR Interval:    QRS Duration: 92 QT Interval:  281 QTC Calculation: 392 R Axis:   77 Text Interpretation:  Atrial fibrillation Ventricular premature complex Repol abnrm suggests ischemia, inferior leads No acute changes Confirmed by Varney Biles [phone]) on 01/29/2017 4:46:09 AM  Also confirmed by Varney Biles [phone]), editor Philomena Doheny [phone])  on 01/29/2017 8:30:32 AM       Radiology Dg Chest Port 1 View  Result Date: 01/29/2017 CLINICAL DATA:  Shortness of breath and chest heaviness for 2 hours. History of leukemia. EXAM: PORTABLE CHEST 1 VIEW COMPARISON:  Chest radiograph December 17, 2012 and CT chest December 31, 2012 cyst cyst FINDINGS: Cardiomediastinal silhouette is normal. Calcified aortic knob. No pleural effusions or focal consolidations. RIGHT apical bullous changes with calcified scarring. RIGHT lung base calcified granuloma. Strandy densities LEFT lung base. Trachea projects midline and there is no pneumothorax. Soft tissue planes and included osseous structures are non-suspicious. IMPRESSION: Unchanged LEFT lung base atelectasis/scarring. Aortic Atherosclerosis (ICD10-I70.0). Electronically Signed   By: Sandie Ano  Bloomer M.D.   On: 01/29/2017 05:27    Procedures .Sedation Date/Time: 01/29/2017 6:34 AM Performed by: Varney Biles, MD Authorized by: Varney Biles, MD   Consent:    Consent obtained:  Written   Consent given by:  Patient   Risks discussed:  Dysrhythmia, prolonged sedation necessitating reversal, respiratory compromise necessitating ventilatory assistance and intubation and nausea Indications:    Procedure performed:  Cardioversion   Intended level of sedation:  Moderate (conscious sedation) Pre-sedation assessment:    Time since last food or drink:  8 hours   ASA classification: class 3 - patient with severe systemic disease     Mouth opening:  3 or more finger widths   Mallampati score:  I - soft palate, uvula, fauces, pillars visible   Pre-sedation assessments completed and reviewed: airway patency, cardiovascular function, hydration status, nausea/vomiting and respiratory function     Pre-sedation assessment completed:  01/29/2017 5:46 AM Immediate pre-procedure details:    Reassessment: Patient reassessed immediately  prior to procedure     Reviewed: vital signs, relevant labs/tests and NPO status     Verified: bag valve mask available, emergency equipment available, IV patency confirmed, oxygen available and suction available   Procedure details (see MAR for exact dosages):    Preoxygenation:  Nasal cannula   Sedation:  Etomidate   Intra-procedure monitoring:  Blood pressure monitoring, continuous capnometry, frequent LOC assessments, frequent vital sign checks, continuous pulse oximetry and cardiac monitor   Intra-procedure events: respiratory depression     Intra-procedure management:  BVM ventilation   Total Provider sedation time (minutes):  28 Post-procedure details:    Post-sedation assessment completed:  01/29/2017 6:37 AM   Attendance: Constant attendance by certified staff until patient recovered     Recovery: Patient returned to pre-procedure baseline     Post-sedation assessments completed and reviewed: airway patency, cardiovascular function, mental status, nausea/vomiting, pain level and respiratory function     Patient is stable for discharge or admission: yes     Patient tolerance:  Tolerated well, no immediate complications .Cardioversion Date/Time: 01/29/2017 6:37 AM Performed by: Varney Biles, MD Authorized by: Varney Biles, MD   Consent:    Consent obtained:  Written   Consent given by:  Patient   Risks discussed:  Cutaneous burn and induced arrhythmia   Alternatives discussed:  No treatment and rate-control medication Pre-procedure details:    Cardioversion basis:  Elective   Rhythm:  Atrial fibrillation   Electrode placement:  Anterior-posterior Patient sedated: Yes. Refer to sedation procedure documentation for details of sedation.  Attempt one:    Cardioversion mode:  Synchronous   Waveform:  Biphasic   Shock (Joules):  200   Shock outcome:  Conversion to normal sinus rhythm Post-procedure details:    Patient status:  Awake   Patient tolerance of procedure:   Tolerated well, no immediate complications   (including critical care time)  CRITICAL CARE Performed by: Varney Biles   Total critical care time: 38 minutes for Afib with RVR with chest pain and HR > 150  Critical care time was exclusive of separately billable procedures and treating other patients.  Critical care was necessary to treat or prevent imminent or life-threatening deterioration.  Critical care was time spent personally by me on the following activities: development of treatment plan with patient and/or surrogate as well as nursing, discussions with consultants, evaluation of patient's response to treatment, examination of patient, obtaining history from patient or surrogate, ordering and performing treatments and interventions, ordering and review  of laboratory studies, ordering and review of radiographic studies, pulse oximetry and re-evaluation of patient's condition.   Medications Ordered in ED Medications  etomidate (AMIDATE) injection (6 mg Intravenous Given 01/29/17 0602)     Initial Impression / Assessment and Plan / ED Course  I have reviewed the triage vital signs and the nursing notes.  Pertinent labs & imaging results that were available during my care of the patient were reviewed by me and considered in my medical decision making (see chart for details).  Clinical Course as of Jan 30 513  Nancy Fetter Jan 29, 2017  2671 Patient is having minimal discomfort. Second trop ordered, patient will be discharged if he is chest pain-free and the second troponin is negative.  [AN]    Clinical Course User Index [AN] Varney Biles, MD    Patient with history of A. fib comes in with chief complaint of chest pain, shortness of breath and is noted to be in A. fib with RVR.  It appears that patient has paroxysmal A. fib, and he went into A. fib with RVR prior to emergency room arrival.  Patient has been taking his medications as prescribed, there are no new medications and  the history and exam not consistent with any infection as the underlying etiology.  Patient's chest pain appears to be ischemic type, likely related to the demand ischemia, EKG has no acute STEMI type findings.  Patient had no chest pain or shortness of breath leading up to the event.  He is on Xarelto and reports strict adherence to his medication, which also reduces his risk of having blood clots despite remote history of cancers.  Basic labs ordered, and are reassuring.  Patient agreed to cardioversion which was successfully completed.  Post cardioversion patient is still having some chest discomfort but he is feeling a lot better.  Heart rate improved to 60s.  Plan is to get second troponin and reassess patient.  We will admit him if he continues to have chest pain for ACS rule out.  If patient is chest pain-free he will be sent home with A. fib clinic follow-up.   This patients CHA2DS2-VASc Score and unadjusted Ischemic Stroke Rate (% per year) is equal to 4.8 % stroke rate/year from a score of 4  Above score calculated as 1 point each if present [CHF, HTN, DM, Vascular=MI/PAD/Aortic Plaque, Age if 65-74, or Male] Above score calculated as 2 points each if present [Age > 75, or Stroke/TIA/TE]   Final Clinical Impressions(s) / ED Diagnoses   Final diagnoses:  Atrial fibrillation with RVR ([place])  Precordial chest pain    New Prescriptions This SmartLink is deprecated. Use AVSMEDLIST instead to display the medication list for a patient.   Varney Biles, MD 01/29/17 2458    Varney Biles, MD 01/29/17 0998    Varney Biles, MD 01/30/17 [phone]

## 2017-01-29 NOTE — ED Triage Notes (Signed)
Brought by ems from home.  Woke up 2 hours pta with chest pressure and heart racing.  On arrival ems reports patients heart rate was 180.  After vagal maneuvers came down to 140.  Also c/o some sob that is better now.  Hx of afib.

## 2017-02-03 ENCOUNTER — Ambulatory Visit (INDEPENDENT_AMBULATORY_CARE_PROVIDER_SITE_OTHER): Payer: Medicare Other | Admitting: Physician Assistant

## 2017-02-03 ENCOUNTER — Encounter: Payer: Self-pay | Admitting: Physician Assistant

## 2017-02-03 VITALS — BP 130/73 | HR 51 | Ht 72.0 in | Wt 161.8 lb

## 2017-02-03 DIAGNOSIS — R001 Bradycardia, unspecified: Secondary | ICD-10-CM

## 2017-02-03 DIAGNOSIS — I2510 Atherosclerotic heart disease of native coronary artery without angina pectoris: Secondary | ICD-10-CM

## 2017-02-03 DIAGNOSIS — I480 Paroxysmal atrial fibrillation: Secondary | ICD-10-CM | POA: Diagnosis not present

## 2017-02-03 DIAGNOSIS — C801 Malignant (primary) neoplasm, unspecified: Secondary | ICD-10-CM | POA: Diagnosis not present

## 2017-02-03 NOTE — Progress Notes (Signed)
Cardiology Office Note    Date:  02/03/2017   ID:  Gerald [name], [name] October 17, 1940, MRN [id number]  PCP:  Gerald Contras, MD  Cardiologist:  Dr. Claiborne [name]   Chief Complaint  Patient presents with  . Follow-up    seen for Dr. Claiborne [name]    History of Present Illness:  Gerald [name] is a 76 y.o. male with PMH of paroxysmal atrial fibrillation, nonobstructive CAD, GERD, small cell carcinoma of lung completed 6 cycles of carboplatin and etoposide between November 2012 and March 2013. He underwent TEE guided cardioversion in 2011. He was treated with Coumadin therapy. He developed recurrent atrial fibrillation in November 2012 and has since been switched to Xarelto therapy. His last office visit was on 03/20/2015, he did have some asymptomatic sinus bradycardia with heart rate 52. Otherwise he was doing well. His Lopressor was decreased to 25 mg twice a day. One year follow-up was recommended. More remotely, it appears he had a cardiac catheterization on 05/20/2005 and again on 01/12/2007. In 2007, he did not have significant coronary artery disease, EF was 60%. Cardiac catheterization in 2008 did show nonobstructive CAD with eccentric 30-40% narrowing in mid RCA, 40% proximal LAD lesion, EF 60%.  I last evaluated the patient on 03/23/2016 for chest pain. Myoview obtained on 03/29/2016 showed EF 65%, no evidence of ischemia or infarction. He most recently went to the hospital on 01/29/2017 was atrial fibrillation with RVR and underwent cardioversion in the ED. This is the first recurrent atrial fibrillation in the past year. Since discharge, he no longer has any chest discomfort. According to his son who acted as the translator this morning, he continued to be quite active every day. He denies any exertional chest discomfort, lower extremity edema, orthopnea or PND. He is quite stable from cardiac perspective. EKG showed sinus bradycardia, heart rate 49. I am unable to increase his metoprolol due to baseline bradycardia. We  will continue on the current medication. He has not missed any Xarelto in the past year. I will discontinue his aspirin. He'll follow-up in 3-4 months.    Past Medical History:  Diagnosis Date  . A-fib ([place])   . Allergy    seasonal allergies  . 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 CAD (coronary artery disease)    last cath 2008 with noncritical CAD  . GERD (gastroesophageal reflux disease)   . Headache 12/17/2012  . Hyperlipidemia   . Leukemia ([place])   . Other pancytopenia ([place]) 04/09/2013  . Small cell lung cancer ([place]) 12/2010  . Smoking   . Unspecified deficiency anemia 04/16/2013    Past Surgical History:  Procedure Laterality Date  . CARDIAC CATHETERIZATION  01/12/2007   noncritical CAD, no change from cath of 05/20/2005. 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  Marland Kitchen NM MYOCAR PERF WALL MOTION  01/23/2007   mild perfusion defect seen in the basal inferior and mid inferior regions - c/w attenuation defect. No ischemia present.    Current Medications: Outpatient Medications Prior to Visit  Medication Sig Dispense Refill  . metoprolol tartrate (LOPRESSOR) 25 MG tablet Take 0.5 tablets (12.5 mg total) by mouth 2 (two) times daily. 180 tablet 3  . simvastatin (ZOCOR) 40 MG tablet Take 1 tablet (40 mg total) by mouth at bedtime. 90 tablet 3  . XARELTO 20 MG TABS tablet TAKE 1 TABLET BY MOUTH EVERY DAY WITH SUPPER 90 tablet 1  . amLODipine (NORVASC) 5 MG tablet Take 1 tablet (5  mg total) by mouth daily. 90 tablet 3  . aspirin EC 81 MG tablet Take 81 mg daily by mouth.    . ciprofloxacin (CIPRO) 250 MG tablet Take 1 tablet (250 mg total) by mouth 2 (two) times daily. 60 tablet 1  . magnesium gluconate (MAGONATE) 500 MG tablet Take 500 mg 2 (two) times daily by mouth.    . metoprolol tartrate (LOPRESSOR) 25 MG tablet TAKE ONE 25 MG TABLET BY MOUTH TWICE DAILY. 180 tablet 3   No facility-administered medications prior to visit.       Allergies:   Patient has no known allergies.   Social History   Socioeconomic History  . Marital status: Married    Spouse name: None  . Number of children: None  . Years of education: None  . Highest education level: None  Social Needs  . Financial resource strain: None  . Food insecurity - worry: None  . Food insecurity - inability: None  . Transportation needs - medical: None  . Transportation needs - non-medical: None  Occupational History  . None  Tobacco Use  . Smoking status: Former Smoker    Packs/day: 1.00    Years: 50.00    Pack years: 50.00    Types: Cigarettes    Last attempt to quit: 03/28/2006    Years since quitting: 10.8  . Smokeless tobacco: Never Used  Substance and Sexual Activity  . Alcohol use: No  . Drug use: No  . Sexual activity: None  Other Topics Concern  . None  Social History Narrative  . None     Family History:  The patient's family history is not on file.   ROS:   Please see the history of present illness.    ROS All other systems reviewed and are negative.   PHYSICAL EXAM:   VS:  BP 130/73   Pulse (!) 51   Ht 6' (1.829 m)   Wt 161 lb 12.8 oz (73.4 kg)   BMI 21.94 kg/m    GEN: Well nourished, well developed, in no acute distress  HEENT: normal  Neck: no JVD, carotid bruits, or masses Cardiac: RRR; no murmurs, rubs, or gallops,no edema  Respiratory:  clear to auscultation bilaterally, normal work of breathing GI: soft, nontender, nondistended, + BS MS: no deformity or atrophy  Skin: warm and dry, no rash Neuro:  Alert and Oriented x 3, Strength and sensation are intact Psych: euthymic mood, full affect  Wt Readings from Last 3 Encounters:  02/03/17 161 lb 12.8 oz (73.4 kg)  01/29/17 162 lb (73.5 kg)  03/29/16 162 lb (73.5 kg)      Studies/Labs Reviewed:   EKG:  EKG is ordered today.  The ekg ordered today demonstrates sinus bradycardia, heart rate 49, no significant ST-T wave changes  Recent Labs: 01/29/2017:  B Natriuretic Peptide 175.7; BUN 11; Creatinine, Ser 1.16; Hemoglobin 14.6; Magnesium 1.7; Platelets 131; Potassium 3.5; Sodium 136   Lipid Panel    Component Value Date/Time   CHOL  09/11/2009 0445    121        ATP III CLASSIFICATION:  <200     mg/dL   Desirable  200-239  mg/dL   Borderline High  >=240    mg/dL   High          TRIG 29 09/11/2009 0445   HDL 41 09/11/2009 0445   CHOLHDL 3.0 09/11/2009 0445   VLDL 6 09/11/2009 0445   LDLCALC  09/11/2009 0445  74        Total Cholesterol/HDL:CHD Risk Coronary Heart Disease Risk Table                     Men   Women  1/2 Average Risk   3.4   3.3  Average Risk       5.0   4.4  2 X Average Risk   9.6   7.1  3 X Average Risk  23.4   11.0        Use the calculated Patient Ratio above and the CHD Risk Table to determine the patient's CHD Risk.        ATP III CLASSIFICATION (LDL):  <100     mg/dL   Optimal  100-129  mg/dL   Near or Above                    Optimal  130-159  mg/dL   Borderline  160-189  mg/dL   High  >190     mg/dL   Very High    Additional studies/ records that were reviewed today include:    Cath 05/20/2005 CONCLUSIONS: 1. No significant coronary artery disease. 2. Normal left ventricular systolic function. 3. Successful closure of the right groin with a Star[employer] device with 1 g of Ancef given prophylactically.   Cath 01/12/2007 CATHETERIZATION DIAGNOSES: 1. Chest pain, etiology not determined. 2. Noncritical coronary disease with no significant change from prior catheterization May 20, 2005, with normal left ventricular function. 3. 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 4. Transient paroxysmal atrial fibrillation on prior admission February 2007. 5. Hyperlipidemia. 6. Possible gastroesophageal reflux disease. 7. Past cigarette abuse, discontinued 1 to 2 years ago.   Myoview 03/29/2016 Study  Highlights     The left ventricular ejection fraction is normal (55-65%).  Nuclear stress EF: 65%.  There was no ST segment deviation noted during stress.  The study is normal.   Normal study, no evidence for ischemia or infarction.      ASSESSMENT:    1. Sinus bradycardia   2. PAF (paroxysmal atrial fibrillation) ([place])   3. Coronary artery disease involving native coronary artery of native heart without angina pectoris   4. Small cell carcinoma ([facility])      PLAN:  In order of problems listed above:  1. Paroxysmal atrial fibrillation: maintaining sinus rhythm with bradycardia, had one episode of recurrence in the last year. Unable to up titrate rate control therapy due to baseline bradycardia. I have instructed the patient to use additional 12.5 mg metoprolol on an as needed basis if he goes back into atrial fibrillation. He has good cardiac awareness of atrial fibrillation. He has been compliant with his Xarelto 20 mg daily.  - This patients CHA2DS2-VASc Score and unadjusted Ischemic Stroke Rate (% per year) is equal to 3.2 % stroke rate/year from a score of 3  Above score calculated as 1 point each if present [CHF, HTN, DM, Vascular=MI/PAD/Aortic Plaque, Age if 65-74, or Male] Above score calculated as 2 points each if present [Age > 75, or Stroke/TIA/TE]   2. History of nonobstructive CAD: Nonobstructive CAD noted on last cardiac catheterization in 2008. Evaluated the patient for chest discomfort with Myoview in January 2018 which came back normal without ischemia or infarction.  3. Small cell carcinoma: Monitor by oncology service.    Medication Adjustments/Labs and Tests Ordered: Current medicines are reviewed at length with the patient today.  Concerns regarding medicines are outlined above.  Medication changes, Labs and Tests ordered today are listed in the Patient Instructions below. Patient Instructions  H??ng d?n xu?t vi?n  H??ng d?n thu?c: STOP  Aspirin  Lm vi?c trong phng th nghi?m: khng ai  Th? t?c th? nghi?m: khng ai  Theo st: Bc s? khuyn b?n nn ln l?ch h?n khm theo di Marcellous: 3-4 thng v?i Bc s? Kelly  B?t k? h??ng d?n ??c bi?t no khc s? ???c li?t k bn d??i (n?u c).  N?u b?n c?n n?p ti?n thu?c tim tr??c cu?c h?n ti?p theo, hy g?i cho nh thu?c c?a b?n.   Medication Instructions:  DISCONTINUE Aspirin  Labwork: None   Testing/Procedures: None   Follow-Up: Your physician recommends that you schedule a follow-up appointment in: 3-4 Months with Dr Gerald [name]  Any Other Special Instructions Will Be Listed Below (If Applicable).  If you need a refill on your cardiac medications before your next appointment, please call your pharmacy.       Gerald [name], [place]  02/03/2017 4:19 PM    [address], [place], [place]  [address] Phone: [phone]; Fax: [phone]

## 2017-02-03 NOTE — Patient Instructions (Signed)
H??ng d?n xu?t vi?n  H??ng d?n thu?c: STOP Aspirin  Lm vi?c trong phng th nghi?m: khng ai  Th? t?c th? nghi?m: khng ai  Theo st: Bc s? khuyn b?n nn ln l?ch h?n khm theo di [place]: 3-4 thng v?i Bc s? Kelly  B?t k? h??ng d?n ??c bi?t no khc s? ???c li?t k bn d??i (n?u c).  N?u b?n c?n n?p ti?n thu?c tim tr??c cu?c h?n ti?p theo, hy g?i cho nh thu?c c?a b?n.   Medication Instructions:  DISCONTINUE Aspirin  Labwork: None   Testing/Procedures: None   Follow-Up: Your physician recommends that you schedule a follow-up appointment in: 3-4 Months with Dr Claiborne Billings  Any Other Special Instructions Will Be Listed Below (If Applicable).  If you need a refill on your cardiac medications before your next appointment, please call your pharmacy.

## 2017-04-29 ENCOUNTER — Other Ambulatory Visit: Payer: Self-pay | Admitting: Cardiology

## 2017-04-29 DIAGNOSIS — Z0389 Encounter for observation for other suspected diseases and conditions ruled out: Secondary | ICD-10-CM

## 2017-04-29 DIAGNOSIS — E785 Hyperlipidemia, unspecified: Secondary | ICD-10-CM

## 2017-04-29 DIAGNOSIS — I480 Paroxysmal atrial fibrillation: Secondary | ICD-10-CM

## 2017-04-29 DIAGNOSIS — IMO0001 Reserved for inherently not codable concepts without codable children: Secondary | ICD-10-CM

## 2017-04-29 DIAGNOSIS — Z7901 Long term (current) use of anticoagulants: Secondary | ICD-10-CM

## 2017-04-29 DIAGNOSIS — C3490 Malignant neoplasm of unspecified part of unspecified bronchus or lung: Secondary | ICD-10-CM

## 2017-04-29 DIAGNOSIS — R001 Bradycardia, unspecified: Secondary | ICD-10-CM

## 2017-05-01 NOTE — Telephone Encounter (Signed)
Rx request sent to pharmacy.  

## 2017-05-10 ENCOUNTER — Ambulatory Visit: Payer: Medicare Other | Admitting: Cardiovascular Disease

## 2017-05-11 ENCOUNTER — Encounter: Payer: Self-pay | Admitting: *Deleted

## 2017-05-12 ENCOUNTER — Ambulatory Visit (INDEPENDENT_AMBULATORY_CARE_PROVIDER_SITE_OTHER): Payer: Medicare Other | Admitting: Cardiovascular Disease

## 2017-05-12 ENCOUNTER — Encounter: Payer: Self-pay | Admitting: Cardiovascular Disease

## 2017-05-12 VITALS — BP 128/76 | HR 48 | Ht 72.0 in | Wt 161.0 lb

## 2017-05-12 DIAGNOSIS — R001 Bradycardia, unspecified: Secondary | ICD-10-CM | POA: Diagnosis not present

## 2017-05-12 DIAGNOSIS — C801 Malignant (primary) neoplasm, unspecified: Secondary | ICD-10-CM

## 2017-05-12 DIAGNOSIS — I25118 Atherosclerotic heart disease of native coronary artery with other forms of angina pectoris: Secondary | ICD-10-CM | POA: Diagnosis not present

## 2017-05-12 DIAGNOSIS — I480 Paroxysmal atrial fibrillation: Secondary | ICD-10-CM | POA: Diagnosis not present

## 2017-05-12 NOTE — Patient Instructions (Signed)
Medication Instructions: Your physician recommends that you continue on your current medications as directed. Please refer to the Current Medication list given to you today.  If you need a refill on your cardiac medications before your next appointment, please call your pharmacy.    Follow-Up: Your physician wants you to follow-up in: Almyra Deforest, [place] in 6 months and Dr. Claiborne Billings in 12 months. You will receive a reminder letter in the mail two months in advance. If you don't receive a letter, please call our office at [phone] to schedule this follow-up appointment.   Thank you for choosing [facility] at [facility]!!

## 2017-05-12 NOTE — Progress Notes (Signed)
Patient ID: Gerald [name], male   DOB: 11/04/1940, 77 y.o.   MRN: [id number]    MRN: [id number]   HPI: Gerald [name] is a 77 y.o. male who presents for followup cardiology evaluation. I last saw him in January 2016.  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 Between November 2012 in March 2013 he completed 6 cycles of carboplatin, etoposide. He is status post TEE guided cardioversion in 2011. Initially he was treated with Coumadin therapy. He develop recurrent atrial fibrillation in November 2012 and since that time has been on xarelto therapy.  Since I last saw him, he has seen several of the extenders.  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  In December 2017 he saw Almyra Deforest, and had nondiagnostic lateral T changes On  03/30/2016 a nuclear perfusion study was normal with note scar or ischemia.  Ejection fraction was 65%.  He has had some issues with bradycardia and his dose of her blocker had been reduced.  He was recently seen in the emergency room in November 2018 and was in atrial fibrillation with rapid ventricular response.  He underwent cardioversion in the emergency room.  He was seen by Almyra Deforest in follow-up.  He was bradycardic and as result, he was unable to further increase his metoprolol.  He presents to the office today feeling well.  He is on metoprolol 25 mg twice a day in addition to simvastatin 40 mg and Xarelto 20 g daily.  He is here with his son who is his interpreter.  He denies recurrent palpitations.  He feels well.  He denies chest pain.  He denies difficulty with sleep.  Past Medical History:  Diagnosis Date  . A-fib ([place])   . Allergy    seasonal allergies  . 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 CAD (coronary artery disease)    last cath 2008  with noncritical CAD  . GERD (gastroesophageal reflux disease)   . Headache 12/17/2012  . Hyperlipidemia   . Leukemia ([place])   . Other pancytopenia ([place]) 04/09/2013  . Small cell lung cancer ([place]) 12/2010  . Smoking   . Unspecified deficiency anemia 04/16/2013    Past Surgical History:  Procedure Laterality Date  . CARDIAC CATHETERIZATION  01/12/2007   noncritical CAD, no change from cath of 05/20/2005. 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  Marland Kitchen NM MYOCAR PERF WALL MOTION  01/23/2007   mild perfusion defect seen in the basal inferior and mid inferior regions - c/w attenuation defect. No ischemia present.    No Known Allergies  Current Outpatient Medications  Medication Sig Dispense Refill  . metoprolol tartrate (LOPRESSOR) 25 MG tablet Take 0.5 tablets (12.5 mg total) by mouth 2 (two) times daily. 180 tablet 3  . simvastatin (ZOCOR) 40 MG tablet Take 1 tablet (40 mg total) by mouth at bedtime. 90 tablet 3  . XARELTO 20 MG TABS tablet TAKE 1 TABLET BY MOUTH EVERY DAY WITH SUPPER 90 tablet 1  . metoprolol tartrate (LOPRESSOR) 25 MG tablet TAKE 1 TABLET BY MOUTH TWICE A DAY (Patient not taking: Reported on 05/12/2017) 180 tablet 3   No current facility-administered medications for this visit.     Social History   Socioeconomic History  . Marital status: Married  Spouse name: Not on file  . Number of children: Not on file  . Years of education: Not on file  . Highest education level: Not on file  Social Needs  . Financial resource strain: Not on file  . Food insecurity - worry: Not on file  . Food insecurity - inability: Not on file  . Transportation needs - medical: Not on file  . Transportation needs - non-medical: Not on file  Occupational History  . Not on file  Tobacco Use  . Smoking status: Former Smoker    Packs/day: 1.00    Years: 50.00    Pack years: 50.00    Types: Cigarettes    Last attempt to quit: 03/28/2006     Years since quitting: 11.1  . Smokeless tobacco: Never Used  Substance and Sexual Activity  . Alcohol use: No  . Drug use: No  . Sexual activity: Not on file  Other Topics Concern  . Not on file  Social History Narrative  . Not on file    History reviewed. No pertinent family history.  Socially he is married has 6 children and 9 grandchildren. Has no recent tobacco use or alcohol use.  ROS General: Negative; No fevers, chills, or night sweats;  HEENT: Negative; No changes in vision or hearing, sinus congestion, difficulty swallowing Pulmonary: Positive for remote history of small cell CA; No cough, wheezing, shortness of breath, hemoptysis Cardiovascular: See history of present illness GI: Negative; No nausea, vomiting, diarrhea, or abdominal pain GU: Negative; No dysuria, hematuria, or difficulty voiding Musculoskeletal: Negative; no myalgias, joint pain, or weakness Hematologic/Oncology: History of CML Endocrine: Negative; no heat/cold intolerance; no diabetes Neuro: Negative; no changes in balance, headaches Skin: Negative; No rashes or skin lesions Psychiatric: Negative; No behavioral problems, depression Sleep: Negative; No snoring, daytime sleepiness, hypersomnolence, bruxism, restless legs, hypnogognic hallucinations, no cataplexy Other comprehensive 14 point system review is negative.    Physical Exam BP 128/76   Pulse (!) 48   Ht 6' (1.829 m)   Wt 161 lb (73 kg)   BMI 21.84 kg/m reads   Repeat blood pressure by me 118/74  Wt Readings from Last 3 Encounters:  05/12/17 161 lb (73 kg)  02/03/17 161 lb 12.8 oz (73.4 kg)  01/29/17 162 lb (73.5 kg)   General: Alert, oriented, no distress.  Skin: normal turgor, no rashes, warm and dry HEENT: Normocephalic, atraumatic. Pupils equal round and reactive to light; sclera anicteric; extraocular muscles intact;  Nose without nasal septal hypertrophy Mouth/Parynx benign; Mallinpatti scale 3 Neck: No JVD, no carotid  bruits; normal carotid upstroke Lungs: clear to ausculatation and percussion; no wheezing or rales Chest wall: without tenderness to palpitation Heart: PMI not displaced, RRR, s1 s2 normal, 1/6 systolic murmur, no diastolic murmur, no rubs, gallops, thrills, or heaves Abdomen: soft, nontender; no hepatosplenomehaly, BS+; abdominal aorta nontender and not dilated by palpation. Back: no CVA tenderness Pulses 2+ Musculoskeletal: full range of motion, normal strength, no joint deformities Extremities: no clubbing cyanosis or edema, Homan's sign negative  Neurologic: grossly nonfocal; Cranial nerves grossly wnl Psychologic: Normal mood and affect   ECG (independently read by me): Sinus bradycardia at 48 bpm with mild sinus arrhythmia.  Normal intervals.  No ectopy.  December 2016 ECG (independently read by me): Sinus bradycardia 55 bpm.  No significant ST segment changes.  QTc interval 445 ms.  October 2014 ECG: Sinus bradycardia at 47 with mild sinus arrhythmia. QTc interval 403 ms.  LABS: BMP Latest Ref Rng &  Units 01/29/2017 03/20/2015 04/24/2013  Glucose 65 - 99 mg/dL 119(H) 84 104  BUN 6 - 20 mg/dL 11 15 22.4  Creatinine 0.61 - 1.24 mg/dL 1.16 1.07 1.2  Sodium 135 - 145 mmol/L 136 139 139  Potassium 3.5 - 5.1 mmol/L 3.5 4.1 4.2  Chloride 101 - 111 mmol/L 105 104 -  CO2 22 - 32 mmol/L 20(L) 25 27  Calcium 8.9 - 10.3 mg/dL 9.0 9.2 9.1   Hepatic Function Latest Ref Rng & Units 03/20/2015 04/24/2013 04/09/2013  Total Protein 6.1 - 8.1 g/dL 7.0 7.1 6.7  Albumin 3.6 - 5.1 g/dL 4.0 3.9 3.6  AST 10 - 35 U/L '23 14 14  '$ ALT 9 - 46 U/L '30 11 10  '$ Alk Phosphatase 40 - 115 U/L 64 69 61  Total Bilirubin 0.2 - 1.2 mg/dL 0.4 0.51 0.40   CBC Latest Ref Rng & Units 01/29/2017 03/20/2015 04/24/2013  WBC 4.0 - 10.5 K/uL 5.7 6.6 3.1(L)  Hemoglobin 13.0 - 17.0 g/dL 14.6 14.0 9.5(L)  Hematocrit 39.0 - 52.0 % 41.3 40.5 27.2(L)  Platelets 150 - 400 K/uL 131(L) 181 25(L)   Lab Results  Component Value  Date   MCV 95.2 01/29/2017   MCV 99.5 03/20/2015   MCV 107.8 (H) 04/24/2013   Lab Results  Component Value Date   TSH 2.342 03/20/2015   Lab Results  Component Value Date   HGBA1C 5.8 (H) 01/23/2011   Lipid Panel     Component Value Date/Time   CHOL  09/11/2009 0445    121        ATP III CLASSIFICATION:  <200     mg/dL   Desirable  200-239  mg/dL   Borderline High  >=240    mg/dL   High          TRIG 29 09/11/2009 0445   HDL 41 09/11/2009 0445   CHOLHDL 3.0 09/11/2009 0445   VLDL 6 09/11/2009 0445   LDLCALC  09/11/2009 0445    74        Total Cholesterol/HDL:CHD Risk Coronary Heart Disease Risk Table                     Men   Women  1/2 Average Risk   3.4   3.3  Average Risk       5.0   4.4  2 X Average Risk   9.6   7.1  3 X Average Risk  23.4   11.0        Use the calculated Patient Ratio above and the CHD Risk Table to determine the patient's CHD Risk.        ATP III CLASSIFICATION (LDL):  <100     mg/dL   Optimal  100-129  mg/dL   Near or Above                    Optimal  130-159  mg/dL   Borderline  160-189  mg/dL   High  >190     mg/dL   Very High    RADIOLOGY: Dg Chest 2 View  12/17/2012   CLINICAL DATA:  History of small cell lung cancer.  EXAM: CHEST  2 VIEW  COMPARISON:  Chest CT Aug 14, 2012, chest x-ray February 28, 2013  FINDINGS: There is a conglomerate of partial calcified mass in the right apex measuring 1.7 x 2.5 cm. This is not significantly changed in appearance compared to prior chest CT of 2014 coronal images.  There is a 6 mm nodule in the lateral right lung base which is not significantly changed compared a prior CT of Aug 14, 2012. There is no focal pneumonia, pulmonary edema, or pleural effusion. Minimal atelectasis of the left lung base is identified. The aorta is tortuous. The heart size is normal. The soft tissues and osseous structures are stable.  IMPRESSION: Conglomerate partial calcified masslike area in the right apex and 6 mm nodule  in the lateral right lung base unchanged compared prior CT of May 2014.   Electronically Signed   By: Abelardo Diesel   On: 12/17/2012 14:43   Ct Chest W Contrast  12/31/2012   CLINICAL DATA:  Follow-up small cell lung cancer  EXAM: CT CHEST, ABDOMEN, AND PELVIS WITH CONTRAST  TECHNIQUE: Multidetector CT imaging of the chest, abdomen and pelvis was performed following the standard protocol during bolus administration of intravenous contrast.  CONTRAST:  17m OMNIPAQUE IOHEXOL 300 MG/ML  SOLN  COMPARISON:  12/19/2011  FINDINGS: CT CHEST FINDINGS  Stable calcified nodularity at the right lung apex (series 4/ image 13) and lateral right middle lobe (series 4/ image 13).  No new/suspicious pulmonary nodules. Mild to moderate paraseptal emphysematous changes with bullous changes at the right lung apex (series 4/ image 10). No pleural effusion or pneumothorax.  Visualized thyroid is unremarkable.  Mild cardiomegaly. No pericardial effusion. Coronary atherosclerosis. Atherosclerotic calcifications of the aortic arch. Ectasia of the ascending thoracic aorta measuring up to 3.8 cm.  11 mm short axis right hilar node (series 2/ image 24), unchanged. No suspicious mediastinal or axillary lymphadenopathy.  Very mild degenerative changes of the thoracic spine.  CT ABDOMEN AND PELVIS FINDINGS  Liver, spleen, pancreas, and adrenal glands are within normal limits.  Gallbladder is unremarkable. No intrahepatic or extrahepatic ductal dilatation.  Kidneys are within normal limits. No hydronephrosis.  No evidence of bowel obstruction.  Atherosclerotic calcifications of the abdominal aorta and branch vessels.  No abdominopelvic ascites.  No suspicious abdominopelvic lymphadenopathy.  Prostate is unremarkable.  Bladder is within normal limits.  Mild degenerative changes of the lumbar spine.  IMPRESSION: CT CHEST IMPRESSION  No evidence of recurrent or metastatic disease in the chest.  11 mm short axis right hilar node, unchanged.  CT  ABDOMEN AND PELVIS IMPRESSION  No evidence of metastatic disease in the abdomen/pelvis.   Electronically Signed   By: SJulian HyM.D.   On: 12/31/2012 14:27   Mr BJeri CosWQQContrast  12/31/2012   CLINICAL DATA:  77year old male with new headache. Initial encounter. History of lung cancer. Restaging.  EXAM: MRI HEAD WITHOUT AND WITH CONTRAST  TECHNIQUE: Multiplanar, multiecho pulse sequences of the brain and surrounding structures were obtained according to standard protocol without and with intravenous contrast  CONTRAST:  132mMULTIHANCE GADOBENATE DIMEGLUMINE 529 MG/ML IV SOLN  COMPARISON:  Brain MRI 02/04/2011.  FINDINGS: No abnormal enhancement identified. No midline shift, mass effect, or evidence of intracranial mass lesion.  Stable cerebral volume. No restricted diffusion to suggest acute infarction. No ventriculomegaly, extra-axial collection or acute intracranial hemorrhage. Cervicomedullary junction and pituitary are within normal limits. Negative visualized cervical spine. Major intracranial vascular flow voids are stable. GrPearline Cablesnd white matter signal appear stable, with scattered small nonspecific foci of white matter T2 and FLAIR hyperintensity, mild for age. No cortical encephalomalacia.  Visualized orbit soft tissues are within normal limits. Visualized paranasal sinuses and mastoids are clear. Visualized scalp soft tissues are within normal limits. Normal bone marrow signal.  IMPRESSION: No acute or metastatic intracranial abnormality. Stable mild for age nonspecific white matter signal changes.   Electronically Signed   By: Lars Pinks M.D.   On: 12/31/2012 16:55   Ct Abdomen Pelvis W Contrast  12/31/2012   CLINICAL DATA:  Follow-up small cell lung cancer  EXAM: CT CHEST, ABDOMEN, AND PELVIS WITH CONTRAST  TECHNIQUE: Multidetector CT imaging of the chest, abdomen and pelvis was performed following the standard protocol during bolus administration of intravenous contrast.  CONTRAST:   161m OMNIPAQUE IOHEXOL 300 MG/ML  SOLN  COMPARISON:  12/19/2011  FINDINGS: CT CHEST FINDINGS  Stable calcified nodularity at the right lung apex (series 4/ image 13) and lateral right middle lobe (series 4/ image 13).  No new/suspicious pulmonary nodules. Mild to moderate paraseptal emphysematous changes with bullous changes at the right lung apex (series 4/ image 10). No pleural effusion or pneumothorax.  Visualized thyroid is unremarkable.  Mild cardiomegaly. No pericardial effusion. Coronary atherosclerosis. Atherosclerotic calcifications of the aortic arch. Ectasia of the ascending thoracic aorta measuring up to 3.8 cm.  11 mm short axis right hilar node (series 2/ image 24), unchanged. No suspicious mediastinal or axillary lymphadenopathy.  Very mild degenerative changes of the thoracic spine.  CT ABDOMEN AND PELVIS FINDINGS  Liver, spleen, pancreas, and adrenal glands are within normal limits.  Gallbladder is unremarkable. No intrahepatic or extrahepatic ductal dilatation.  Kidneys are within normal limits. No hydronephrosis.  No evidence of bowel obstruction.  Atherosclerotic calcifications of the abdominal aorta and branch vessels.  No abdominopelvic ascites.  No suspicious abdominopelvic lymphadenopathy.  Prostate is unremarkable.  Bladder is within normal limits.  Mild degenerative changes of the lumbar spine.  IMPRESSION: CT CHEST IMPRESSION  No evidence of recurrent or metastatic disease in the chest.  11 mm short axis right hilar node, unchanged.  CT ABDOMEN AND PELVIS IMPRESSION  No evidence of metastatic disease in the abdomen/pelvis.   Electronically Signed   By: SJulian HyM.D.   On: 12/31/2012 14:27    Lexiscan Myoview 03/29/2016 Study Highlights     The left ventricular ejection fraction is normal (55-65%).  Nuclear stress EF: 65%.  There was no ST segment deviation noted during stress.  The study is normal.   Normal study, no evidence for ischemia or infarction.        IMPRESSION:  1. PAF (paroxysmal atrial fibrillation) ([place]   2. Coronary artery disease involving native coronary artery of native heart with other form of angina pectoris ([place]   3. Sinus bradycardia   4. Small cell carcinoma ([place]     ASSESSMENT AND PLAN: GeraldMolenda is a 77year [place] who has a history of paroxysmal atrial fibrillation. .  Remotely, he had undergone prior cardiac catheterizations and was not found to have significant cardiac artery disease with his last catheterization in 2008 showing nonobstructive CAD.  He recently developed an episode of recurrent atrial fibrillation and underwent successful cardioversion in November 2018.  He is maintaining sinus rhythm on his current regimen of metoprolol 25 mg twice a day.  He is anticoagulated on Xarelto.  He continues to be on simvastatin for hyperlipidemia with target LDL less than 70.  He is tolerating this well without myalgias.  I reviewed his records from his last several years.  I have recommended he follow-up and see HAlmyra Deforestin 6 months and I will see him in one year for reevaluation Time spent: 25 minutes  TTroy Sine MD, [facility] 05/14/2017 9:45  AM    

## 2017-05-14 ENCOUNTER — Encounter: Payer: Self-pay | Admitting: Cardiovascular Disease

## 2017-05-15 NOTE — Addendum Note (Signed)
Addended by: Venetia Maxon on: 05/15/2017 02:55 PM   Modules accepted: Orders

## 2017-06-19 ENCOUNTER — Other Ambulatory Visit: Payer: Self-pay

## 2017-06-19 DIAGNOSIS — C3490 Malignant neoplasm of unspecified part of unspecified bronchus or lung: Secondary | ICD-10-CM

## 2017-06-19 DIAGNOSIS — IMO0001 Reserved for inherently not codable concepts without codable children: Secondary | ICD-10-CM

## 2017-06-19 DIAGNOSIS — R001 Bradycardia, unspecified: Secondary | ICD-10-CM

## 2017-06-19 DIAGNOSIS — Z7901 Long term (current) use of anticoagulants: Secondary | ICD-10-CM

## 2017-06-19 DIAGNOSIS — Z0389 Encounter for observation for other suspected diseases and conditions ruled out: Secondary | ICD-10-CM

## 2017-06-19 DIAGNOSIS — E785 Hyperlipidemia, unspecified: Secondary | ICD-10-CM

## 2017-06-19 DIAGNOSIS — I480 Paroxysmal atrial fibrillation: Secondary | ICD-10-CM

## 2017-06-19 MED ORDER — RIVAROXABAN 20 MG PO TABS: ORAL_TABLET | ORAL | 3 refills | Status: DC

## 2017-08-23 ENCOUNTER — Telehealth: Payer: Self-pay | Admitting: Cardiovascular Disease

## 2017-08-23 NOTE — Telephone Encounter (Signed)
New Message:    Pt is flying to [place] on 09-01-17.Daughter wants to know if pt needs to be seen before his trip.

## 2017-08-24 NOTE — Telephone Encounter (Signed)
If stable, no need to be seen prior to trip

## 2017-08-24 NOTE — Telephone Encounter (Signed)
Left detailed message with recommendations. Advised to call back with questions or concerns.

## 2017-08-30 ENCOUNTER — Telehealth: Payer: Self-pay | Admitting: Cardiovascular Disease

## 2017-08-30 NOTE — Telephone Encounter (Signed)
Daughter calling    Pt is going out of town to [place] for 3weeks and daughter is calling inquiring about if pt can get a prescription or samples for medication to control his heartbeat. Sometimes the pt's heartrate will get out of rhythm. Daughter didn't know the name of the medication. Please call daughter if any questions.

## 2017-08-30 NOTE — Telephone Encounter (Signed)
Spoke with pt dtr, she reports they were told by a cardiologist when he went to the hospital last time that he needed to have NTG to take. She reports he gets chest pain, SOB and then will go out of rhythm. Explained that is not the usual thing to take with atrial fib and according to meng's last office note they were told to take extra 1/2 dose of metoprolol. She is requesting NTG for the patient to have when he travels. Will forward to dr Claiborne Billings to review and advise.

## 2017-08-31 NOTE — Telephone Encounter (Signed)
Patient does not have significant CAD may not need sublingual nitroglycerin.  If he has had recent episodes of chest pain okay to prescribe at 0.4 mg and take as needed

## 2017-09-01 MED ORDER — NITROGLYCERIN 0.4 MG SL SUBL: 0.4000 mg | SUBLINGUAL_TABLET | SUBLINGUAL | 3 refills | Status: DC | PRN

## 2017-09-01 NOTE — Telephone Encounter (Signed)
Spoke to daughter-she states patient is not having any issues currently but he is going out of town and just wants to have this with him in case.    rx sent to pharmacy (daughter requested small supply).   Advised if he starts experiencing symptoms and having to use NTG then to notify us.  She verbalized understanding.

## 2017-09-01 NOTE — Telephone Encounter (Signed)
Left message to call back  

## 2017-09-01 NOTE — Telephone Encounter (Signed)
Pt returning your call

## 2017-10-27 ENCOUNTER — Telehealth: Payer: Self-pay | Admitting: Cardiovascular Disease

## 2017-10-27 NOTE — Telephone Encounter (Signed)
Patient's daughter called in stating that the patient had chest discomfort. She stated that this was not a pain but more like a discomfort when the patient used his arms such as lifting them to reach for something. He did take nitro last night with some relief.   The patient stated that the discomfort is better today and it only felt uncomfortable when he moves his arms. He denies any other symptoms such as shortness of breath.   It has been advised that if the discomfort worsens or starts to radiate then they should go to an urgent care or ED. The daughter has verbalized her understanding and stated that she may take her father to an urgent care.

## 2017-10-27 NOTE — Telephone Encounter (Signed)
New Message:       Pt c/o of Chest Pain: STAT if CP now or developed within 24 hours  1. Are you having CP right now? No only when he moves  2. Are you experiencing any other symptoms (ex. SOB, nausea, vomiting, sweating)? No  3. How long have you been experiencing CP? Started last night  4. Is your CP continuous or coming and going? Coming and going  5. Have you taken Nitroglycerin? yes ?

## 2017-11-02 ENCOUNTER — Telehealth: Payer: Self-pay | Admitting: Cardiovascular Disease

## 2017-11-02 NOTE — Telephone Encounter (Signed)
New Message:     I made this pt an appointment on 11-08-17 with [name]. Pt have been having some chest tightness and shortness of breath. Please evaluate to see if pt needs to be seen before 11-08-17 please.

## 2017-11-02 NOTE — Telephone Encounter (Signed)
Per daughter call was made to make a f/u appt.Pt doing okay had symptoms last week has had to take 1 ntg on 2 different occasions with relief. Instructed if pt has to take up to 3 with no relief then needs to got ED for eval and tx Pt has appt with [name] PA  next week Pt's daughter agrees with plan ./cy

## 2017-11-08 ENCOUNTER — Encounter: Payer: Self-pay | Admitting: Cardiology

## 2017-11-08 ENCOUNTER — Ambulatory Visit (INDEPENDENT_AMBULATORY_CARE_PROVIDER_SITE_OTHER): Payer: Medicare Other | Admitting: Cardiology

## 2017-11-08 DIAGNOSIS — R079 Chest pain, unspecified: Secondary | ICD-10-CM

## 2017-11-08 DIAGNOSIS — E782 Mixed hyperlipidemia: Secondary | ICD-10-CM

## 2017-11-08 DIAGNOSIS — Z7901 Long term (current) use of anticoagulants: Secondary | ICD-10-CM | POA: Diagnosis not present

## 2017-11-08 DIAGNOSIS — I2510 Atherosclerotic heart disease of native coronary artery without angina pectoris: Secondary | ICD-10-CM | POA: Diagnosis not present

## 2017-11-08 DIAGNOSIS — I480 Paroxysmal atrial fibrillation: Secondary | ICD-10-CM | POA: Diagnosis not present

## 2017-11-08 DIAGNOSIS — C3490 Malignant neoplasm of unspecified part of unspecified bronchus or lung: Secondary | ICD-10-CM

## 2017-11-08 NOTE — Progress Notes (Signed)
11/08/2017 Gerald [name]   1940/07/16  [id number]  Primary Physician Antony Contras, MD Primary Cardiologist: Dr Claiborne Billings  HPI:  77 y/o [place] male with a history of mild CAD at cath in 2008- 40% LAD, 40% RCA, low risk Myoview in Jan 2018, PAF-NSR, SB on beta blocker, dyslipidemia, and chronic anticoagulation. He is in the office today because his family has noted the patient took SL NTG on two occasions over the past month. The pt describes SSCP, non exertional, no associated nausea, vomiting, or SOB. He doesn't do much during the day but he denies and chest pain when he is up walking around. He denies any tachycardia, near syncope, or syncope.     Current Outpatient Medications  Medication Sig Dispense Refill  . metoprolol tartrate (LOPRESSOR) 25 MG tablet Take 0.5 tablets (12.5 mg total) by mouth 2 (two) times daily. 180 tablet 3  . nitroGLYCERIN (NITROSTAT) 0.4 MG SL tablet Place 1 tablet (0.4 mg total) under the tongue every 5 (five) minutes as needed for chest pain. 10 tablet 3  . rivaroxaban (XARELTO) 20 MG TABS tablet TAKE 1 TABLET BY MOUTH EVERY DAY WITH SUPPER 90 tablet 3  . simvastatin (ZOCOR) 40 MG tablet Take 1 tablet (40 mg total) by mouth at bedtime. 90 tablet 3   No current facility-administered medications for this visit.     No Known Allergies  Past Medical History:  Diagnosis Date  . A-fib ([place])   . Allergy    seasonal allergies  . 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 CAD (coronary artery disease)    last cath 2008 with noncritical CAD  . GERD (gastroesophageal reflux disease)   . Headache 12/17/2012  . Hyperlipidemia   . Leukemia ([place])   . Other pancytopenia ([place]) 04/09/2013  . 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 Smoking   . Unspecified deficiency anemia 04/16/2013    Social History   Socioeconomic History  . Marital status: Married    Spouse name: Not on file  . Number of children: Not on file  . Years of education: Not on file  .  Highest education level: Not on file  Occupational History  . Not on file  Social Needs  . Financial resource strain: Not on file  . Food insecurity:    Worry: Not on file    Inability: Not on file  . Transportation needs:    Medical: Not on file    Non-medical: Not on file  Tobacco Use  . Smoking status: Former Smoker    Packs/day: 1.00    Years: 50.00    Pack years: 50.00    Types: Cigarettes    Last attempt to quit: 03/28/2006    Years since quitting: 11.6  . Smokeless tobacco: Never Used  Substance and Sexual Activity  . Alcohol use: No  . Drug use: No  . Sexual activity: Not on file  Lifestyle  . Physical activity:    Days per week: Not on file    Minutes per session: Not on file  . Stress: Not on file  Relationships  . Social connections:    Talks on phone: Not on file    Gets together: Not on file    Attends religious service: Not on file    Active member of club or organization: Not on file    Attends meetings of clubs or organizations: Not on file    Relationship status: Not on file  . Intimate partner violence:  Fear of current or ex partner: Not on file    Emotionally abused: Not on file    Physically abused: Not on file    Forced sexual activity: Not on file  Other Topics Concern  . Not on file  Social History Narrative  . Not on file     No family history on file.   Review of Systems: General: negative for chills, fever, night sweats or weight changes.  Cardiovascular: negative for chest pain, dyspnea on exertion, edema, orthopnea, palpitations, paroxysmal nocturnal dyspnea or shortness of breath Dermatological: negative for rash Respiratory: negative for cough or wheezing Urologic: negative for hematuria Abdominal: negative for nausea, vomiting, diarrhea, bright red blood per rectum, melena, or hematemesis Neurologic: negative for visual changes, syncope, or dizziness All other systems reviewed and are otherwise negative except as noted  above.    Blood pressure 120/76, pulse (!) 56, height 5\' 8"  (1.727 m), weight 160 lb 6.4 oz (72.8 kg).  General appearance: alert, cooperative and no distress Neck: no carotid bruit and no JVD Lungs: clear to auscultation bilaterally Heart: regular rate and rhythm and soft systolic murmur AOV Extremities: no edema Skin: cool, pale, dry Neurologic: Grossly normal  EKG NSR, NSST changes  ASSESSMENT AND PLAN:   Chest pain with moderate risk of acute coronary syndrome Pt has had two episodes of SSCP "tightness" relieved with SL NTG  CAD (coronary artery disease) 40% LAD, 40% RCA at cath 2008. Myoview low risk Jan 2018  Paroxysmal atrial fibrillation NSR-SB  Chronic anticoagulation Xarelto  Small cell lung cancer Chemo 2012  Hyperlipidemia On statin   PLAN  Discussed with pt's son- will proceed with Lexiscan Myoview. I suggested he could decrease his Lopressor to 25 mg BID.   [name] [name] 11/08/2017 9:57 AM

## 2017-11-08 NOTE — Assessment & Plan Note (Signed)
Pt has had two episodes of SSCP "tightness" relieved with SL NTG

## 2017-11-08 NOTE — Assessment & Plan Note (Signed)
Xarelto

## 2017-11-08 NOTE — Assessment & Plan Note (Signed)
On statin.

## 2017-11-08 NOTE — Assessment & Plan Note (Signed)
40% LAD, 40% RCA at cath 2008. Myoview low risk Jan 2018

## 2017-11-08 NOTE — Assessment & Plan Note (Signed)
NSR-SB

## 2017-11-08 NOTE — Assessment & Plan Note (Signed)
Chemo 2012

## 2017-11-08 NOTE — Patient Instructions (Addendum)
Medication Instructions:  Your physician recommends that you continue on your current medications as directed. Please refer to the Current Medication list given to you today. If you need a refill on your cardiac medications before your next appointment, please call your pharmacy.  Labwork: None   Testing/Procedures: Your physician has requested that you have a lexiscan myoview. For further information please visit [email or web address]. Please follow instruction sheet, as given.  Follow-Up: Your physician recommends that you schedule a follow-up appointment in: 3 MONTHS with DR KELLY.  Any Other Special Instructions Will Be Listed Below (If Applicable).

## 2017-11-14 ENCOUNTER — Telehealth (HOSPITAL_COMMUNITY): Payer: Self-pay

## 2017-11-14 NOTE — Telephone Encounter (Signed)
Encounter complete. 

## 2017-11-15 ENCOUNTER — Telehealth (HOSPITAL_COMMUNITY): Payer: Self-pay

## 2017-11-15 NOTE — Telephone Encounter (Signed)
Encounter complete. 

## 2017-11-16 ENCOUNTER — Ambulatory Visit (HOSPITAL_COMMUNITY)
Admission: RE | Admit: 2017-11-16 | Payer: Medicare Other | Source: Ambulatory Visit | Attending: Cardiology | Admitting: Cardiology

## 2017-11-21 ENCOUNTER — Telehealth (HOSPITAL_COMMUNITY): Payer: Self-pay

## 2017-11-21 NOTE — Telephone Encounter (Signed)
Encounter complete. Attempted contact through [facility] ID # [id number]

## 2017-11-22 ENCOUNTER — Telehealth (HOSPITAL_COMMUNITY): Payer: Self-pay

## 2017-11-22 NOTE — Telephone Encounter (Signed)
Encounter complete. 

## 2017-11-23 ENCOUNTER — Ambulatory Visit (HOSPITAL_COMMUNITY)
Admission: RE | Admit: 2017-11-23 | Discharge: 2017-11-23 | Disposition: A | Discharge status: Home / Self Care | Payer: Medicare Other | Source: Ambulatory Visit | Attending: Cardiology | Admitting: Cardiology

## 2017-11-23 DIAGNOSIS — C3490 Malignant neoplasm of unspecified part of unspecified bronchus or lung: Secondary | ICD-10-CM | POA: Diagnosis present

## 2017-11-23 DIAGNOSIS — R079 Chest pain, unspecified: Secondary | ICD-10-CM | POA: Diagnosis present

## 2017-11-23 DIAGNOSIS — E782 Mixed hyperlipidemia: Secondary | ICD-10-CM

## 2017-11-23 DIAGNOSIS — Z7901 Long term (current) use of anticoagulants: Secondary | ICD-10-CM

## 2017-11-23 DIAGNOSIS — I480 Paroxysmal atrial fibrillation: Secondary | ICD-10-CM

## 2017-11-23 DIAGNOSIS — I2510 Atherosclerotic heart disease of native coronary artery without angina pectoris: Secondary | ICD-10-CM | POA: Diagnosis present

## 2017-11-23 LAB — MYOCARDIAL PERFUSION IMAGING
LV dias vol: 74 mL (ref 62–150)
LV sys vol: 27 mL
Peak HR: 87 {beats}/min
Rest HR: 55 {beats}/min
SDS: 0
SRS: 1
SSS: 1
TID: 1.19

## 2017-11-23 MED ORDER — TECHNETIUM TC 99M TETROFOSMIN IV KIT
24.6000 | PACK | Freq: Once | INTRAVENOUS | Status: AC | PRN
  Administered 2017-11-23: 24.6 via INTRAVENOUS
  Filled 2017-11-23: qty 25

## 2017-11-23 MED ORDER — REGADENOSON 0.4 MG/5ML IV SOLN
0.4000 mg | Freq: Once | INTRAVENOUS | Status: AC
  Administered 2017-11-23: 0.4 mg via INTRAVENOUS

## 2017-11-23 MED ORDER — TECHNETIUM TC 99M TETROFOSMIN IV KIT
7.9000 | PACK | Freq: Once | INTRAVENOUS | Status: AC | PRN
  Administered 2017-11-23: 7.9 via INTRAVENOUS
  Filled 2017-11-23: qty 8

## 2017-12-18 ENCOUNTER — Telehealth: Payer: Self-pay | Admitting: Cardiovascular Disease

## 2017-12-18 NOTE — Telephone Encounter (Signed)
Daughter notified directly and voiced understanding. She updated patients phone number; someone entered the wrong number in patients chart. She stated she is taking over her fathers care and to contact her in regards to her father.

## 2017-12-18 NOTE — Telephone Encounter (Signed)
Interpreter left message for patient to contact office to discuss stress test.

## 2017-12-18 NOTE — Telephone Encounter (Signed)
Follow Up:    Pt would like his results from his Stress Test he had on 11-23-17 please.

## 2017-12-18 NOTE — Telephone Encounter (Signed)
New Message:    Patient daughter called back for results and stated he does not need a interpreter

## 2017-12-19 ENCOUNTER — Ambulatory Visit: Payer: Medicare Other | Admitting: Physician Assistant

## 2018-01-25 ENCOUNTER — Telehealth: Payer: Self-pay | Admitting: *Deleted

## 2018-01-25 NOTE — Telephone Encounter (Signed)
Will review pre-op clearance during office visit with Dr. Claiborne Billings 02/19/18 as Needs Interpreter - [place]. If required sooner clearance. Please call back.

## 2018-01-25 NOTE — Telephone Encounter (Signed)
   [facility] Pre-operative Risk Assessment    Request for surgical clearance:  1. What type of surgery is being performed? Extraction of multiple teeth    2. When is this surgery scheduled? tbd   3. What type of clearance is required (medical clearance vs. Pharmacy clearance to hold med vs. Both)?   4. Are there any medications that need to be held prior to surgery and how long? Does not specify   5. Practice name and name of physician performing surgery? [facility], Dr. Melissa Noon  6. What is your office phone number [phone]   7.   What is your office fax number [phone]  8.   Anesthesia type (None, local, MAC, general)  Versed & Fentanyl    Clearance request asking if abx required  Gerald [name] 01/25/2018, 11:28 AM  _________________________________________________________________   (provider comments below)

## 2018-02-08 ENCOUNTER — Encounter: Payer: Self-pay | Admitting: *Deleted

## 2018-02-19 ENCOUNTER — Ambulatory Visit (INDEPENDENT_AMBULATORY_CARE_PROVIDER_SITE_OTHER): Payer: Medicare Other | Admitting: Cardiovascular Disease

## 2018-02-19 ENCOUNTER — Encounter: Payer: Self-pay | Admitting: Cardiovascular Disease

## 2018-02-19 VITALS — BP 133/80 | HR 55 | Ht 68.0 in | Wt 158.4 lb

## 2018-02-19 DIAGNOSIS — E782 Mixed hyperlipidemia: Secondary | ICD-10-CM

## 2018-02-19 DIAGNOSIS — I480 Paroxysmal atrial fibrillation: Secondary | ICD-10-CM | POA: Diagnosis not present

## 2018-02-19 DIAGNOSIS — I2510 Atherosclerotic heart disease of native coronary artery without angina pectoris: Secondary | ICD-10-CM | POA: Diagnosis not present

## 2018-02-19 DIAGNOSIS — Z7901 Long term (current) use of anticoagulants: Secondary | ICD-10-CM | POA: Diagnosis not present

## 2018-02-19 NOTE — Patient Instructions (Signed)
Medication Instructions:  Your physician recommends that you continue on your current medications as directed. Please refer to the Current Medication list given to you today.  If you need a refill on your cardiac medications before your next appointment, please call your pharmacy.   Follow-Up: At [facility], you and your health needs are our priority.  As part of our continuing mission to provide you with exceptional heart care, we have created designated Provider Care Teams.  These Care Teams include your primary Cardiologist (physician) and Advanced Practice Providers (APPs -  Physician Assistants and Nurse Practitioners) who all work together to provide you with the care you need, when you need it. You will need a follow up appointment in 12 months.  Please call our office 2 months in advance to schedule this appointment.  You may see Dr. Claiborne Billings or one of the following Advanced Practice Providers on your designated Care Team: [place], [place] . Fabian Sharp, [name]

## 2018-02-19 NOTE — Progress Notes (Signed)
Patient ID: Gerald [name], male   DOB: [birth year], 77 y.o.   MRN: [id number]    MRN: [id number]   HPI: Gerald [name] is a 77 y.o. male who presents for a 9 month  followup cardiology evaluation.   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 Between November 2012 in March 2013 he completed 6 cycles of carboplatin, etoposide. He is status post TEE guided cardioversion in 2011. Initially he was treated with Coumadin therapy. He develop recurrent atrial fibrillation in November 2012 and since that time has been on xarelto therapy.  Since I last saw him, he has seen several of the extenders.  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  In December 2017 he saw Almyra Deforest, and had nondiagnostic lateral T changes On  03/30/2016 a nuclear perfusion study was normal with note scar or ischemia.  Ejection fraction was 65%.  He has had some issues with bradycardia and his dose of her blocker had been reduced.  He was recently seen in the emergency room in November 2018 and was in atrial fibrillation with rapid ventricular response.  He underwent cardioversion in the emergency room.  He was seen by Almyra Deforest in follow-up.  He was bradycardic and as result, he was unable to further increase his metoprolol.  Last saw him in February 2019.  At that time he was doing well.  He was on metoprolol 25 mg twice a day in addition to simvastatin 40 mg and Xarelto 20 g daily.  He denied palpitations, chest pain, awareness of atrial fibrillation.  Over the past 9 months,Gerald [name] continued to feel well.  Was seen in August 2019 by Gerald [name] and at that time family had noticed that he had taken 2 sublingual nitroglycerin over the month previously.  He had atypical nonexertional chest pain.  He underwent a nuclear perfusion  study on November 23, 2017 which continued to show normal perfusion.  There were no ECG changes.  Post stress ejection fraction was 64%.  Subsequently, he has continued to do well and specifically denies chest pain, PND orthopnea.  He is unaware of palpitations presyncope or syncope.  He presents for reevaluation.  Past Medical History:  Diagnosis Date  . A-fib ([place])   . Allergy    seasonal allergies  . 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 CAD (coronary artery disease)    last cath 2008 with noncritical CAD  . GERD (gastroesophageal reflux disease)   . Headache 12/17/2012  . Hyperlipidemia   . Leukemia ([place])   . Other pancytopenia ([place]) 04/09/2013  . Small cell lung cancer ([place]) 12/2010  . Smoking   . Unspecified deficiency anemia 04/16/2013    Past Surgical History:  Procedure Laterality Date  . CARDIAC CATHETERIZATION  01/12/2007   noncritical CAD, no change from cath of 05/20/2005. 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  Marland Kitchen NM MYOCAR PERF WALL MOTION  01/23/2007   mild perfusion defect seen in the basal inferior and mid inferior regions - c/w attenuation defect. No ischemia present.    No Known Allergies  Current Outpatient Medications  Medication Sig Dispense Refill  . metoprolol tartrate (LOPRESSOR) 25 MG tablet Take 0.5 tablets (12.5 mg total) by mouth 2 (two) times daily. 180 tablet 3  .  nitroGLYCERIN (NITROSTAT) 0.4 MG SL tablet Place 1 tablet (0.4 mg total) under the tongue every 5 (five) minutes as needed for chest pain. 10 tablet 3  . rivaroxaban (XARELTO) 20 MG TABS tablet TAKE 1 TABLET BY MOUTH EVERY DAY WITH SUPPER 90 tablet 3  . simvastatin (ZOCOR) 40 MG tablet Take 1 tablet (40 mg total) by mouth at bedtime. 90 tablet 3   No current facility-administered medications for this visit.     Social History   Socioeconomic History  . Marital status: Married    Spouse name: Not on file  . Number of children:  Not on file  . Years of education: Not on file  . Highest education level: Not on file  Occupational History  . Not on file  Social Needs  . Financial resource strain: Not on file  . Food insecurity:    Worry: Not on file    Inability: Not on file  . Transportation needs:    Medical: Not on file    Non-medical: Not on file  Tobacco Use  . Smoking status: Former Smoker    Packs/day: 1.00    Years: 50.00    Pack years: 50.00    Types: Cigarettes    Last attempt to quit: 03/28/2006    Years since quitting: 11.9  . Smokeless tobacco: Never Used  Substance and Sexual Activity  . Alcohol use: No  . Drug use: No  . Sexual activity: Not on file  Lifestyle  . Physical activity:    Days per week: Not on file    Minutes per session: Not on file  . Stress: Not on file  Relationships  . Social connections:    Talks on phone: Not on file    Gets together: Not on file    Attends religious service: Not on file    Active member of club or organization: Not on file    Attends meetings of clubs or organizations: Not on file    Relationship status: Not on file  . Intimate partner violence:    Fear of current or ex partner: Not on file    Emotionally abused: Not on file    Physically abused: Not on file    Forced sexual activity: Not on file  Other Topics Concern  . Not on file  Social History Narrative  . Not on file    Family History  Problem Relation Age of Onset  . Healthy Mother   . Thyroid cancer Sister   . Healthy Son     Socially he is married has 6 children and 9 grandchildren. Has no recent tobacco use or alcohol use.  ROS General: Negative; No fevers, chills, or night sweats;  HEENT: Negative; No changes in vision or hearing, sinus congestion, difficulty swallowing Pulmonary: Positive for remote history of small cell CA; No cough, wheezing, shortness of breath, hemoptysis Cardiovascular: See history of present illness GI: Negative; No nausea, vomiting, diarrhea, or  abdominal pain GU: Negative; No dysuria, hematuria, or difficulty voiding Musculoskeletal: Negative; no myalgias, joint pain, or weakness Hematologic/Oncology: History of CML Endocrine: Negative; no heat/cold intolerance; no diabetes Neuro: Negative; no changes in balance, headaches Skin: Negative; No rashes or skin lesions Psychiatric: Negative; No behavioral problems, depression Sleep: Negative; No snoring, daytime sleepiness, hypersomnolence, bruxism, restless legs, hypnogognic hallucinations, no cataplexy Other comprehensive 14 point system review is negative.    Physical Exam BP 133/80   Pulse (!) 55   Ht 5' 8"  (1.727 m)   Wt  158 lb 6.4 oz (71.8 kg)   BMI 24.08 kg/m reads   Repeat blood pressure by me was 128/80  Wt Readings from Last 3 Encounters:  02/19/18 158 lb 6.4 oz (71.8 kg)  11/23/17 160 lb (72.6 kg)  11/08/17 160 lb 6.4 oz (72.8 kg)   General: Alert, oriented, no distress.  Skin: normal turgor, no rashes, warm and dry HEENT: Normocephalic, atraumatic. Pupils equal round and reactive to light; sclera anicteric; extraocular muscles intact;  Nose without nasal septal hypertrophy Mouth/Parynx benign; Mallinpatti scale 2 Neck: No JVD, no carotid bruits; normal carotid upstroke Lungs: clear to ausculatation and percussion; no wheezing or rales Chest wall: without tenderness to palpitation Heart: PMI not displaced, RRR, s1 s2 normal, 1/6 systolic murmur, no diastolic murmur, no rubs, gallops, thrills, or heaves Abdomen: soft, nontender; no hepatosplenomehaly, BS+; abdominal aorta nontender and not dilated by palpation. Back: no CVA tenderness Pulses 2+ Musculoskeletal: full range of motion, normal strength, no joint deformities Extremities: no clubbing cyanosis or edema, Homan's sign negative  Neurologic: grossly nonfocal; Cranial nerves grossly wnl Psychologic: Normal mood and affect   ECG (independently read by me): Sinus bradycardia at 55 bpm with PAC.  Normal  intervals.  February 2019 ECG (independently read by me): Sinus bradycardia at 48 bpm with mild sinus arrhythmia.  Normal intervals.  No ectopy.  December 2016 ECG (independently read by me): Sinus bradycardia 55 bpm.  No significant ST segment changes.  QTc interval 445 ms.  October 2014 ECG: Sinus bradycardia at 47 with mild sinus arrhythmia. QTc interval 403 ms.  LABS: BMP Latest Ref Rng & Units 01/29/2017 03/20/2015 04/24/2013  Glucose 65 - 99 mg/dL 119(H) 84 104  BUN 6 - 20 mg/dL 11 15 22.4  Creatinine 0.61 - 1.24 mg/dL 1.16 1.07 1.2  Sodium 135 - 145 mmol/L 136 139 139  Potassium 3.5 - 5.1 mmol/L 3.5 4.1 4.2  Chloride 101 - 111 mmol/L 105 104 -  CO2 22 - 32 mmol/L 20(L) 25 27  Calcium 8.9 - 10.3 mg/dL 9.0 9.2 9.1   Hepatic Function Latest Ref Rng & Units 03/20/2015 04/24/2013 04/09/2013  Total Protein 6.1 - 8.1 g/dL 7.0 7.1 6.7  Albumin 3.6 - 5.1 g/dL 4.0 3.9 3.6  AST 10 - 35 U/L 23 14 14   ALT 9 - 46 U/L 30 11 10   Alk Phosphatase 40 - 115 U/L 64 69 61  Total Bilirubin 0.2 - 1.2 mg/dL 0.4 0.51 0.40   CBC Latest Ref Rng & Units 01/29/2017 03/20/2015 04/24/2013  WBC 4.0 - 10.5 K/uL 5.7 6.6 3.1(L)  Hemoglobin 13.0 - 17.0 g/dL 14.6 14.0 9.5(L)  Hematocrit 39.0 - 52.0 % 41.3 40.5 27.2(L)  Platelets 150 - 400 K/uL 131(L) 181 25(L)   Lab Results  Component Value Date   MCV 95.2 01/29/2017   MCV 99.5 03/20/2015   MCV 107.8 (H) 04/24/2013   Lab Results  Component Value Date   TSH 2.342 03/20/2015   Lab Results  Component Value Date   HGBA1C 5.8 (H) 01/23/2011   Lipid Panel     Component Value Date/Time   CHOL  09/11/2009 0445    121        ATP III CLASSIFICATION:  <200     mg/dL   Desirable  200-239  mg/dL   Borderline High  >=240    mg/dL   High          TRIG 29 09/11/2009 0445   HDL 41 09/11/2009 0445   CHOLHDL 3.0  09/11/2009 0445   VLDL 6 09/11/2009 0445   LDLCALC  09/11/2009 0445    74        Total Cholesterol/HDL:CHD Risk Coronary Heart Disease Risk Table                      Men   Women  1/2 Average Risk   3.4   3.3  Average Risk       5.0   4.4  2 X Average Risk   9.6   7.1  3 X Average Risk  23.4   11.0        Use the calculated Patient Ratio above and the CHD Risk Table to determine the patient's CHD Risk.        ATP III CLASSIFICATION (LDL):  <100     mg/dL   Optimal  100-129  mg/dL   Near or Above                    Optimal  130-159  mg/dL   Borderline  160-189  mg/dL   High  >190     mg/dL   Very High    RADIOLOGY: Dg Chest 2 View  12/17/2012   CLINICAL DATA:  History of small cell lung cancer.  EXAM: CHEST  2 VIEW  COMPARISON:  Chest CT Aug 14, 2012, chest x-ray February 28, 2013  FINDINGS: There is a conglomerate of partial calcified mass in the right apex measuring 1.7 x 2.5 cm. This is not significantly changed in appearance compared to prior chest CT of 2014 coronal images. There is a 6 mm nodule in the lateral right lung base which is not significantly changed compared a prior CT of Aug 14, 2012. There is no focal pneumonia, pulmonary edema, or pleural effusion. Minimal atelectasis of the left lung base is identified. The aorta is tortuous. The heart size is normal. The soft tissues and osseous structures are stable.  IMPRESSION: Conglomerate partial calcified masslike area in the right apex and 6 mm nodule in the lateral right lung base unchanged compared prior CT of May 2014.   Electronically Signed   By: Abelardo Diesel   On: 12/17/2012 14:43   Ct Chest W Contrast  12/31/2012   CLINICAL DATA:  Follow-up small cell lung cancer  EXAM: CT CHEST, ABDOMEN, AND PELVIS WITH CONTRAST  TECHNIQUE: Multidetector CT imaging of the chest, abdomen and pelvis was performed following the standard protocol during bolus administration of intravenous contrast.  CONTRAST:  140m OMNIPAQUE IOHEXOL 300 MG/ML  SOLN  COMPARISON:  12/19/2011  FINDINGS: CT CHEST FINDINGS  Stable calcified nodularity at the right lung apex (series 4/ image 13) and lateral  right middle lobe (series 4/ image 13).  No new/suspicious pulmonary nodules. Mild to moderate paraseptal emphysematous changes with bullous changes at the right lung apex (series 4/ image 10). No pleural effusion or pneumothorax.  Visualized thyroid is unremarkable.  Mild cardiomegaly. No pericardial effusion. Coronary atherosclerosis. Atherosclerotic calcifications of the aortic arch. Ectasia of the ascending thoracic aorta measuring up to 3.8 cm.  11 mm short axis right hilar node (series 2/ image 24), unchanged. No suspicious mediastinal or axillary lymphadenopathy.  Very mild degenerative changes of the thoracic spine.  CT ABDOMEN AND PELVIS FINDINGS  Liver, spleen, pancreas, and adrenal glands are within normal limits.  Gallbladder is unremarkable. No intrahepatic or extrahepatic ductal dilatation.  Kidneys are within normal limits. No hydronephrosis.  No evidence of bowel obstruction.  Atherosclerotic calcifications of the abdominal aorta and branch vessels.  No abdominopelvic ascites.  No suspicious abdominopelvic lymphadenopathy.  Prostate is unremarkable.  Bladder is within normal limits.  Mild degenerative changes of the lumbar spine.  IMPRESSION: CT CHEST IMPRESSION  No evidence of recurrent or metastatic disease in the chest.  11 mm short axis right hilar node, unchanged.  CT ABDOMEN AND PELVIS IMPRESSION  No evidence of metastatic disease in the abdomen/pelvis.   Electronically Signed   By: Julian Hy M.D.   On: 12/31/2012 14:27   Gerald Gerald [name] XM Contrast  12/31/2012   CLINICAL DATA:  77 year old male with new headache. Initial encounter. History of lung cancer. Restaging.  EXAM: MRI HEAD WITHOUT AND WITH CONTRAST  TECHNIQUE: Multiplanar, multiecho pulse sequences of the brain and surrounding structures were obtained according to standard protocol without and with intravenous contrast  CONTRAST:  61m MULTIHANCE GADOBENATE DIMEGLUMINE 529 MG/ML IV SOLN  COMPARISON:  Brain MRI 02/04/2011.   FINDINGS: No abnormal enhancement identified. No midline shift, mass effect, or evidence of intracranial mass lesion.  Stable cerebral volume. No restricted diffusion to suggest acute infarction. No ventriculomegaly, extra-axial collection or acute intracranial hemorrhage. Cervicomedullary junction and pituitary are within normal limits. Negative visualized cervical spine. Major intracranial vascular flow voids are stable. GPearline Cablesand white matter signal appear stable, with scattered small nonspecific foci of white matter T2 and FLAIR hyperintensity, mild for age. No cortical encephalomalacia.  Visualized orbit soft tissues are within normal limits. Visualized paranasal sinuses and mastoids are clear. Visualized scalp soft tissues are within normal limits. Normal bone marrow signal.  IMPRESSION: No acute or metastatic intracranial abnormality. Stable mild for age nonspecific white matter signal changes.   Electronically Signed   By: LLars PinksM.D.   On: 12/31/2012 16:55   Ct Abdomen Pelvis W Contrast  12/31/2012   CLINICAL DATA:  Follow-up small cell lung cancer  EXAM: CT CHEST, ABDOMEN, AND PELVIS WITH CONTRAST  TECHNIQUE: Multidetector CT imaging of the chest, abdomen and pelvis was performed following the standard protocol during bolus administration of intravenous contrast.  CONTRAST:  1053mOMNIPAQUE IOHEXOL 300 MG/ML  SOLN  COMPARISON:  12/19/2011  FINDINGS: CT CHEST FINDINGS  Stable calcified nodularity at the right lung apex (series 4/ image 13) and lateral right middle lobe (series 4/ image 13).  No new/suspicious pulmonary nodules. Mild to moderate paraseptal emphysematous changes with bullous changes at the right lung apex (series 4/ image 10). No pleural effusion or pneumothorax.  Visualized thyroid is unremarkable.  Mild cardiomegaly. No pericardial effusion. Coronary atherosclerosis. Atherosclerotic calcifications of the aortic arch. Ectasia of the ascending thoracic aorta measuring up to 3.8 cm.  11  mm short axis right hilar node (series 2/ image 24), unchanged. No suspicious mediastinal or axillary lymphadenopathy.  Very mild degenerative changes of the thoracic spine.  CT ABDOMEN AND PELVIS FINDINGS  Liver, spleen, pancreas, and adrenal glands are within normal limits.  Gallbladder is unremarkable. No intrahepatic or extrahepatic ductal dilatation.  Kidneys are within normal limits. No hydronephrosis.  No evidence of bowel obstruction.  Atherosclerotic calcifications of the abdominal aorta and branch vessels.  No abdominopelvic ascites.  No suspicious abdominopelvic lymphadenopathy.  Prostate is unremarkable.  Bladder is within normal limits.  Mild degenerative changes of the lumbar spine.  IMPRESSION: CT CHEST IMPRESSION  No evidence of recurrent or metastatic disease in the chest.  11 mm short axis right hilar node, unchanged.  CT ABDOMEN AND PELVIS IMPRESSION  No evidence of  metastatic disease in the abdomen/pelvis.   Electronically Signed   By: Julian Hy M.D.   On: 12/31/2012 14:27    Lexiscan Myoview 03/29/2016 Study Highlights     The left ventricular ejection fraction is normal (55-65%).  Nuclear stress EF: 65%.  There was no ST segment deviation noted during stress.  The study is normal.   Normal study, no evidence for ischemia or infarction.     Gerald [name] Myoview 11/23/2017 Study Highlights     The left ventricular ejection fraction is normal (55-65%).  Nuclear stress EF: 64%.  There was no ST segment deviation noted during stress.  The study is normal.   1. EF 64%, normal wall motion.  2. No evidence for ischemia or infarction by perfusion images.   Normal study.      IMPRESSION:  1. PAF (paroxysmal atrial fibrillation) ([place])   2. Coronary artery disease involving native coronary artery of native heart without angina pectoris   3. Chronic anticoagulation   4. Mixed hyperlipidemia     ASSESSMENT AND PLAN: GeraldOshea is a 77 year old [place]  gentleman who has a history of paroxysmal atrial fibrillation.  Remotely, he had undergone prior cardiac catheterizations and was not found to have significant cardiac artery disease with his last catheterization in 2008 showing nonobstructive CAD.  In November 2018 he developed a recurrent episode of atrial fibrillation and was successfully cardioverted.  Subsequently, he has maintaining sinus rhythm and is unaware of any recurrent palpitations.  His ECG today confirms sinus rhythm with mild bradycardia 55 bpm and no significant ST changes.  He continues to be on low-dose metoprolol at 12.5 mg twice a day.  His blood pressure today is stable.  He continues to be on Xarelto 20 mg.  He is tolerating this well and denies bleeding from his anticoagulation.  He had developed somewhat atypical chest pain leading to repeat nuclear evaluation in August 2019 which was unchanged from previously.  Lipid studies in July 2019 showed an LDL cholesterol at 60 total cholesterol 134 and he continues to be on simvastatin and is tolerating this well without myalgias.  Triglycerides were elevated at 182.  I discussed reduction in sweets and carbohydrates.  I have recommended he continue his current medical regimen.  As long as he remains stable I will see him in 1 year for reevaluation.  Time spent: 25 minutes  Troy Sine, MD, [facility]  02/19/2018 1:05 PM

## 2018-02-19 NOTE — Progress Notes (Deleted)
13

## 2018-02-26 ENCOUNTER — Telehealth: Payer: Self-pay

## 2018-02-26 NOTE — Telephone Encounter (Signed)
   [facility] Pre-operative Risk Assessment    Request for surgical clearance:  1. What type of surgery is being performed? Extraction of multiple teeth.   2. When is this surgery scheduled? TBD   3. What type of clearance is required (medical clearance vs. Pharmacy clearance to hold med vs. Both)? Both  4. Are there any medications that need to be held prior to surgery and how long? Xarelto   5. Practice name and name of physician performing surgery? [facility]- Dr. Primus Bravo    6. What is your office phone number [phone]    7.   What is your office fax number [phone]  8.   Anesthesia type (None, local, MAC, general) ? IV Sedation   Gerald [name] 02/26/2018, 3:47 PM  _________________________________________________________________   (provider comments below)

## 2018-02-27 NOTE — Telephone Encounter (Signed)
Patient was doing well at his visit from 02/19/2018 with Dr. Claiborne Billings.   Routing to pharmacy.   Burtis Junes, RN, [place] [address] [place], [place]  [phone]

## 2018-02-28 NOTE — Telephone Encounter (Addendum)
Pt takes Xarelto for afib with CHADS2VASc score of 3 (age x2, CAD). Renal function is normal. If pt is having 2-3 teeth extracted, ok to hold Xarelto 1 day prior. If > 3 teeth being extracted, ok to hold Xarelto for 2 days prior. He does not require pre op abx.

## 2018-04-21 ENCOUNTER — Ambulatory Visit (INDEPENDENT_AMBULATORY_CARE_PROVIDER_SITE_OTHER): Payer: Medicare Other

## 2018-04-21 ENCOUNTER — Ambulatory Visit (HOSPITAL_COMMUNITY)
Admission: EM | Admit: 2018-04-21 | Discharge: 2018-04-21 | Disposition: A | Discharge status: Home / Self Care | Payer: Medicare Other | Attending: Radiology | Admitting: Radiology

## 2018-04-21 ENCOUNTER — Encounter (HOSPITAL_COMMUNITY): Payer: Self-pay

## 2018-04-21 DIAGNOSIS — R0689 Other abnormalities of breathing: Secondary | ICD-10-CM | POA: Diagnosis not present

## 2018-04-21 DIAGNOSIS — R05 Cough: Secondary | ICD-10-CM

## 2018-04-21 DIAGNOSIS — R059 Cough, unspecified: Secondary | ICD-10-CM

## 2018-04-21 MED ORDER — DOXYCYCLINE HYCLATE 100 MG PO CAPS: 100.0000 mg | ORAL_CAPSULE | Freq: Two times a day (BID) | ORAL | 0 refills | Status: DC

## 2018-04-21 MED ORDER — ALBUTEROL SULFATE (2.5 MG/3ML) 0.083% IN NEBU
INHALATION_SOLUTION | RESPIRATORY_TRACT | Status: AC
  Filled 2018-04-21: qty 3

## 2018-04-21 MED ORDER — ACETAMINOPHEN 325 MG PO TABS
ORAL_TABLET | ORAL | Status: AC
  Filled 2018-04-21: qty 2

## 2018-04-21 MED ORDER — ALBUTEROL SULFATE (2.5 MG/3ML) 0.083% IN NEBU
5.0000 mg | INHALATION_SOLUTION | Freq: Once | RESPIRATORY_TRACT | Status: AC
  Administered 2018-04-21: 5 mg via RESPIRATORY_TRACT

## 2018-04-21 MED ORDER — BENZONATATE 100 MG PO CAPS: 100.0000 mg | ORAL_CAPSULE | Freq: Three times a day (TID) | ORAL | 0 refills | Status: DC

## 2018-04-21 MED ORDER — ALBUTEROL SULFATE HFA 108 (90 BASE) MCG/ACT IN AERS: 1.0000 | INHALATION_SPRAY | Freq: Four times a day (QID) | RESPIRATORY_TRACT | 0 refills | Status: DC | PRN

## 2018-04-21 MED ORDER — ACETAMINOPHEN 325 MG PO TABS
650.0000 mg | ORAL_TABLET | Freq: Once | ORAL | Status: AC
  Administered 2018-04-21: 650 mg via ORAL

## 2018-04-21 NOTE — ED Provider Notes (Signed)
[place] [facility]    CSN: [id number] Arrival date & time: 04/21/18  1539     History   Chief Complaint Chief Complaint  Patient presents with  . Cough    HPI Gerald [name] is a 78 y.o. male.   78 year old male presents with productive cough and chills times approximately 1 month.  Patient states that the beginning of the month he was seen and treated for flu however residual cough remains.  Patient was seen at [facility] office approximately 2 weeks ago for cough however daughter says that given additional prescriptions at that time.  Patient and daughter at bedside deny any fevers nasal congestion symptoms of upper respiratory illness nausea vomiting or diarrhea. Per chart review patient has a history of lymphoma and is currently being worked up for possible lung cancer.  Patient condition improved albuterol treatment.     Past Medical History:  Diagnosis Date  . A-fib ([place])   . Allergy    seasonal allergies  . 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 CAD (coronary artery disease)    last cath 2008 with noncritical CAD  . GERD (gastroesophageal reflux disease)   . Headache 12/17/2012  . Hyperlipidemia   . Leukemia ([place])   . Other pancytopenia ([place]) 04/09/2013  . 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 Smoking   . Unspecified deficiency anemia 04/16/2013    Patient Active Problem List   Diagnosis Date Noted  . Chest pain with moderate risk of acute coronary syndrome 11/08/2017  . CAD (coronary artery disease) 11/08/2017  . Paroxysmal atrial fibrillation ([place]) 04/10/2014  . Sinus bradycardia 04/10/2014  . 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 Unspecified deficiency anemia 04/16/2013  . Other pancytopenia ([place]) 04/09/2013  . Chronic anticoagulation 01/09/2013  . Headache 12/17/2012  . Hyperlipidemia   . Smoking   . Small cell lung cancer ([place]) 12/27/2010    Past Surgical History:  Procedure Laterality Date  . CARDIAC CATHETERIZATION   01/12/2007   noncritical CAD, no change from cath of 05/20/2005. 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  Marland Kitchen NM MYOCAR PERF WALL MOTION  01/23/2007   mild perfusion defect seen in the basal inferior and mid inferior regions - c/w attenuation defect. No ischemia present.       Home Medications    Prior to Admission medications   Medication Sig Start Date End Date Taking? Authorizing Provider  metoprolol tartrate (LOPRESSOR) 25 MG tablet Take 0.5 tablets (12.5 mg total) by mouth 2 (two) times daily. 03/23/16 02/19/18  Almyra Deforest, PA  nitroGLYCERIN (NITROSTAT) 0.4 MG SL tablet Place 1 tablet (0.4 mg total) under the tongue every 5 (five) minutes as needed for chest pain. 09/01/17 02/19/18  Troy Sine, MD  rivaroxaban (XARELTO) 20 MG TABS tablet TAKE 1 TABLET BY MOUTH EVERY DAY WITH SUPPER 06/19/17   Troy Sine, MD  simvastatin (ZOCOR) 40 MG tablet Take 1 tablet (40 mg total) by mouth at bedtime. 03/20/15   Erlene Quan, [name]    Family History Family History  Problem Relation Age of Onset  . Healthy Mother   . Thyroid cancer Sister   . Healthy Son     Social History Social History   Tobacco Use  . Smoking status: Former Smoker    Packs/day: 1.00    Years: 50.00    Pack years: 50.00    Types: Cigarettes    Last attempt to quit: 03/28/2006  Years since quitting: 12.0  . Smokeless tobacco: Never Used  Substance Use Topics  . Alcohol use: No  . Drug use: No     Allergies   Patient has no known allergies.   Review of Systems Review of Systems  Constitutional: Positive for chills. Negative for fever.  HENT: Negative for ear pain and sore throat.   Eyes: Negative for pain and visual disturbance.  Respiratory: Positive for cough ( productive). Negative for shortness of breath.   Cardiovascular: Negative for chest pain and palpitations.  Gastrointestinal: Negative for abdominal pain and vomiting.  Genitourinary:  Negative for dysuria and hematuria.  Musculoskeletal: Negative for arthralgias and back pain.  Skin: Negative for color change and rash.  Neurological: Negative for seizures and syncope.  All other systems reviewed and are negative.    Physical Exam Triage Vital Signs ED Triage Vitals  Enc Vitals Group     BP 04/21/18 1656 135/67     Pulse Rate 04/21/18 1656 66     Resp 04/21/18 1656 16     Temp 04/21/18 1656 99.9 F (37.7 C)     Temp Source 04/21/18 1656 Oral     SpO2 04/21/18 1656 96 %     Weight --      Height --      Head Circumference --      Peak Flow --      Pain Score 04/21/18 1657 7     Pain Loc --      Pain Edu? --      Excl. in [place]? --    No data found.  Updated Vital Signs BP 135/67 (BP Location: Left Arm)   Pulse 66   Temp 99.9 F (37.7 C) (Oral)   Resp 16   SpO2 96%   Visual Acuity Right Eye Distance:   Left Eye Distance:   Bilateral Distance:    Right Eye Near:   Left Eye Near:    Bilateral Near:     Physical Exam Vitals signs and nursing note reviewed.  Constitutional:      Appearance: He is well-developed.  HENT:     Head: Normocephalic.  Neck:     Musculoskeletal: Normal range of motion.  Cardiovascular:     Rate and Rhythm: Normal rate and regular rhythm.  Pulmonary:     Effort: Pulmonary effort is normal.     Comments: Decreased breath sounds in bilateral apex Musculoskeletal: Normal range of motion.  Skin:    General: Skin is dry.  Neurological:     Mental Status: He is alert and oriented to person, place, and time.      UC Treatments / Results  Labs (all labs ordered are listed, but only abnormal results are displayed) Labs Reviewed - No data to display  EKG None  Radiology No results found.  Procedures Procedures (including critical care time)  Medications Ordered in UC Medications  albuterol (PROVENTIL) (2.5 MG/3ML) 0.083% nebulizer solution 5 mg (has no administration in time range)  acetaminophen (TYLENOL)  tablet 650 mg (has no administration in time range)    Initial Impression / Assessment and Plan / UC Course  I have reviewed the triage vital signs and the nursing notes.  Pertinent labs & imaging results that were available during my care of the patient were reviewed by me and considered in my medical decision making (see chart for details).      Final Clinical Impressions(s) / UC Diagnoses   Final diagnoses:  None  Discharge Instructions   None    ED Prescriptions    None     Controlled Substance Prescriptions  Controlled Substance Registry consulted? Not Applicable   Jacqualine Mau, NP 04/21/18 1821

## 2018-04-21 NOTE — Discharge Instructions (Addendum)
Continue to push fluids and take over the counter medications as directed on the back of the box for symptomatic relief.  Follow-up as scheduled for CT chest.

## 2018-04-21 NOTE — ED Triage Notes (Signed)
Pt presents with persistent cough for over 3 weeks that has progressively got worse after recovering from the flu; pt states he is not producing any mucus and the cough is causing chest pain, rib pain, back pain and some abdominal pain.

## 2018-05-05 ENCOUNTER — Other Ambulatory Visit: Payer: Self-pay | Admitting: Cardiovascular Disease

## 2018-05-05 DIAGNOSIS — R079 Chest pain, unspecified: Secondary | ICD-10-CM

## 2018-06-22 ENCOUNTER — Other Ambulatory Visit: Payer: Self-pay | Admitting: Cardiovascular Disease

## 2018-06-22 DIAGNOSIS — IMO0001 Reserved for inherently not codable concepts without codable children: Secondary | ICD-10-CM

## 2018-06-22 DIAGNOSIS — E785 Hyperlipidemia, unspecified: Secondary | ICD-10-CM

## 2018-06-22 DIAGNOSIS — Z7901 Long term (current) use of anticoagulants: Secondary | ICD-10-CM

## 2018-06-22 DIAGNOSIS — R001 Bradycardia, unspecified: Secondary | ICD-10-CM

## 2018-06-22 DIAGNOSIS — I480 Paroxysmal atrial fibrillation: Secondary | ICD-10-CM

## 2018-06-22 DIAGNOSIS — Z0389 Encounter for observation for other suspected diseases and conditions ruled out: Secondary | ICD-10-CM

## 2018-06-22 DIAGNOSIS — C3490 Malignant neoplasm of unspecified part of unspecified bronchus or lung: Secondary | ICD-10-CM

## 2019-02-25 ENCOUNTER — Ambulatory Visit: Payer: Medicare Other | Admitting: Physician Assistant

## 2019-04-08 ENCOUNTER — Ambulatory Visit: Payer: Medicare Other | Admitting: Cardiovascular Disease

## 2019-04-25 ENCOUNTER — Other Ambulatory Visit: Payer: Self-pay

## 2019-04-25 ENCOUNTER — Emergency Department (HOSPITAL_COMMUNITY)
Admission: EM | Admit: 2019-04-25 | Discharge: 2019-04-25 | Disposition: A | Discharge status: Home / Self Care | Payer: Medicare Other | Attending: Emergency Medicine | Admitting: Emergency Medicine

## 2019-04-25 ENCOUNTER — Emergency Department (HOSPITAL_COMMUNITY): Payer: Medicare Other

## 2019-04-25 ENCOUNTER — Encounter (HOSPITAL_COMMUNITY): Payer: Self-pay | Admitting: Emergency Medicine

## 2019-04-25 DIAGNOSIS — Z79899 Other long term (current) drug therapy: Secondary | ICD-10-CM | POA: Diagnosis not present

## 2019-04-25 DIAGNOSIS — Z85118 Personal history of other malignant neoplasm of bronchus and lung: Secondary | ICD-10-CM | POA: Diagnosis not present

## 2019-04-25 DIAGNOSIS — R0789 Other chest pain: Secondary | ICD-10-CM | POA: Diagnosis present

## 2019-04-25 DIAGNOSIS — I2510 Atherosclerotic heart disease of native coronary artery without angina pectoris: Secondary | ICD-10-CM | POA: Insufficient documentation

## 2019-04-25 DIAGNOSIS — Z87891 Personal history of nicotine dependence: Secondary | ICD-10-CM | POA: Insufficient documentation

## 2019-04-25 DIAGNOSIS — Z7901 Long term (current) use of anticoagulants: Secondary | ICD-10-CM | POA: Diagnosis not present

## 2019-04-25 DIAGNOSIS — I480 Paroxysmal atrial fibrillation: Secondary | ICD-10-CM | POA: Insufficient documentation

## 2019-04-25 LAB — BASIC METABOLIC PANEL
Anion gap: 6 (ref 5–15)
BUN: 17 mg/dL (ref 8–23)
CO2: 24 mmol/L (ref 22–32)
Calcium: 8.2 mg/dL — ABNORMAL LOW (ref 8.9–10.3)
Chloride: 109 mmol/L (ref 98–111)
Creatinine, Ser: 0.99 mg/dL (ref 0.61–1.24)
GFR calc Af Amer: >60 mL/min (ref >60)
GFR calc non Af Amer: >60 mL/min (ref >60)
Glucose, Bld: 107 mg/dL — ABNORMAL HIGH (ref 70–99)
Potassium: 3.8 mmol/L (ref 3.5–5.1)
Sodium: 139 mmol/L (ref 135–145)

## 2019-04-25 LAB — CBC WITH DIFFERENTIAL/PLATELET
Abs Immature Granulocytes: 0.01 10*3/uL (ref 0.00–0.07)
Basophils Absolute: 0 10*3/uL (ref 0.0–0.1)
Basophils Relative: 1 %
Eosinophils Absolute: 0.4 10*3/uL (ref 0.0–0.5)
Eosinophils Relative: 7 %
HCT: 39.6 % (ref 39.0–52.0)
Hemoglobin: 13.4 g/dL (ref 13.0–17.0)
Immature Granulocytes: 0 %
Lymphocytes Relative: 35 %
Lymphs Abs: 2 10*3/uL (ref 0.7–4.0)
MCH: 33.6 pg (ref 26.0–34.0)
MCHC: 33.8 g/dL (ref 30.0–36.0)
MCV: 99.2 fL (ref 80.0–100.0)
Monocytes Absolute: 0.7 10*3/uL (ref 0.1–1.0)
Monocytes Relative: 12 %
Neutro Abs: 2.7 10*3/uL (ref 1.7–7.7)
Neutrophils Relative %: 45 %
Platelets: 116 10*3/uL — ABNORMAL LOW (ref 150–400)
RBC: 3.99 MIL/uL — ABNORMAL LOW (ref 4.22–5.81)
RDW: 13 % (ref 11.5–15.5)
WBC: 5.9 10*3/uL (ref 4.0–10.5)
nRBC: 0 % (ref 0.0–0.2)

## 2019-04-25 LAB — TROPONIN I (HIGH SENSITIVITY)
Troponin I (High Sensitivity): 19 ng/L — ABNORMAL HIGH (ref <18)
Troponin I (High Sensitivity): 32 ng/L — ABNORMAL HIGH (ref <18)

## 2019-04-25 MED ORDER — IOHEXOL 350 MG/ML SOLN
80.0000 mL | Freq: Once | INTRAVENOUS | Status: AC | PRN
  Administered 2019-04-25: 80 mL via INTRAVENOUS

## 2019-04-25 MED ORDER — MORPHINE SULFATE (PF) 4 MG/ML IV SOLN
4.0000 mg | Freq: Once | INTRAVENOUS | Status: AC
  Administered 2019-04-25: 4 mg via INTRAVENOUS
  Filled 2019-04-25: qty 1

## 2019-04-25 NOTE — ED Provider Notes (Signed)
[facility] EMERGENCY DEPARTMENT Provider Note   CSN: [id number] Arrival date & time: 04/25/19  2536   History Chief Complaint  Patient presents with  . Chest Pain  . Atrial Fibrillation    Gerald [name] is a 79 y.o. male.  The history is provided by the patient and a relative. A language interpreter was used.  Chest Pain Atrial Fibrillation Associated symptoms include chest pain.  He has history of hyperlipidemia, paroxysmal atrial fibrillation, lung cancer and comes in because of an episode of atrial fibrillation.  He was doing well until he woke up tonight with chest heaviness and sense that his heart was racing.  There is no associated dyspnea, nausea, diaphoresis.  EMS was called and noted atrial fibrillation with rapid ventricular response.  He was given aspirin, nitroglycerin, and intravenous diltiazem.  Heart rate came down to normal, but then spit up and he was given a second dose of diltiazem.  Patient now states that he feels much better but still has some residual heaviness in his chest.  Of note, he is anticoagulated on rivaroxaban.  Past Medical History:  Diagnosis Date  . A-fib ([place])   . Allergy    seasonal allergies  . 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 CAD (coronary artery disease)    last cath 2008 with noncritical CAD  . GERD (gastroesophageal reflux disease)   . Headache 12/17/2012  . Hyperlipidemia   . Leukemia ([place])   . Other pancytopenia ([place]) 04/09/2013  . 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 Smoking   . Unspecified deficiency anemia 04/16/2013    Patient Active Problem List   Diagnosis Date Noted  . Chest pain with moderate risk of acute coronary syndrome 11/08/2017  . CAD (coronary artery disease) 11/08/2017  . Paroxysmal atrial fibrillation ([place]) 04/10/2014  . Sinus bradycardia 04/10/2014  . 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 Unspecified deficiency anemia 04/16/2013  . Other pancytopenia ([place])  04/09/2013  . Chronic anticoagulation 01/09/2013  . Headache 12/17/2012  . Hyperlipidemia   . Smoking   . Small cell lung cancer ([place]) 12/27/2010    Past Surgical History:  Procedure Laterality Date  . CARDIAC CATHETERIZATION  01/12/2007   noncritical CAD, no change from cath of 05/20/2005. 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  Marland Kitchen NM MYOCAR PERF WALL MOTION  01/23/2007   mild perfusion defect seen in the basal inferior and mid inferior regions - c/w attenuation defect. No ischemia present.       Family History  Problem Relation Age of Onset  . Healthy Mother   . Thyroid cancer Sister   . Healthy Son     Social History   Tobacco Use  . Smoking status: Former Smoker    Packs/day: 1.00    Years: 50.00    Pack years: 50.00    Types: Cigarettes    Quit date: 03/28/2006    Years since quitting: 13.0  . Smokeless tobacco: Never Used  Substance Use Topics  . Alcohol use: No  . Drug use: No    Home Medications Prior to Admission medications   Medication Sig Start Date End Date Taking? Authorizing Provider  albuterol (PROVENTIL HFA;VENTOLIN HFA) 108 (90 Base) MCG/ACT inhaler Inhale 1-2 puffs into the lungs every 6 (six) hours as needed for wheezing or shortness of breath. 04/21/18   Jacqualine Mau, NP  benzonatate (TESSALON) 100 MG capsule Take 1 capsule (100 mg total) by mouth  every 8 (eight) hours. 04/21/18   Jacqualine Mau, NP  doxycycline (VIBRAMYCIN) 100 MG capsule Take 1 capsule (100 mg total) by mouth 2 (two) times daily. 04/21/18   Jacqualine Mau, NP  metoprolol tartrate (LOPRESSOR) 25 MG tablet TAKE 1 TABLET BY MOUTH TWICE A DAY 05/07/18   Troy Sine, MD  nitroGLYCERIN (NITROSTAT) 0.4 MG SL tablet Place 1 tablet (0.4 mg total) under the tongue every 5 (five) minutes as needed for chest pain. 09/01/17 02/19/18  Troy Sine, MD  simvastatin (ZOCOR) 40 MG tablet Take 1 tablet (40 mg total) by mouth at  bedtime. 03/20/15   Erlene Quan, [name]  XARELTO 20 MG TABS tablet TAKE 1 TABLET BY MOUTH EVERY DAY WITH SUPPER 06/22/18   Troy Sine, MD    Allergies    Patient has no known allergies.  Review of Systems   Review of Systems  Cardiovascular: Positive for chest pain.  All other systems reviewed and are negative.   Physical Exam Updated Vital Signs BP (!) 93/59 (BP Location: Right Arm)   Temp 98.3 F (36.8 C) (Oral)   Resp 16   Physical Exam Vitals and nursing note reviewed.   79 year old male, resting comfortably and in no acute distress. Vital signs are normal. Oxygen saturation is 96%, which is normal. Head is normocephalic and atraumatic. PERRLA, EOMI. Oropharynx is clear. Neck is nontender and supple without adenopathy or JVD. Back is nontender and there is no CVA tenderness. Lungs are clear without rales, wheezes, or rhonchi. Chest is nontender. Heart has regular rate and rhythm without murmur. Abdomen is soft, flat, nontender without masses or hepatosplenomegaly and peristalsis is normoactive. Extremities have no cyanosis or edema, full range of motion is present. Skin is warm and dry without rash. Neurologic: Mental status is normal, cranial nerves are intact, there are no motor or sensory deficits.  ED Results / Procedures / Treatments   Labs (all labs ordered are listed, but only abnormal results are displayed) Labs Reviewed  CBC WITH DIFFERENTIAL/PLATELET - Abnormal; Notable for the following components:      Result Value   RBC 3.99 (*)    Platelets 116 (*)    All other components within normal limits  BASIC METABOLIC PANEL  BASIC METABOLIC PANEL  TROPONIN I (HIGH SENSITIVITY)  TROPONIN I (HIGH SENSITIVITY)    EKG EKG Interpretation  Date/Time:  Thursday April 25 2019 06:29:22 EST Ventricular Rate:  146 PR Interval:    QRS Duration: 103 QT Interval:  283 QTC Calculation: 356 R Axis:   70 Text Interpretation: Sinus rhythm RSR' in V1 or V2,  probably normal variant Artifact in lead(s) I II III aVR aVL aVF V1 V2 V3 V4 V5 V6 and baseline wander in lead(s) V2 When compared with ECG of 01/29/2017, No significant change was found Confirmed by Delora Fuel ([id number]) on 04/25/2019 6:35:56 AM   Radiology DG Chest Port 1 View  Result Date: 04/25/2019 CLINICAL DATA:  Chest pain and atrial fibrillation EXAM: PORTABLE CHEST 1 VIEW COMPARISON:  Radiograph 04/21/2018 FINDINGS: Nodular masslike opacity is again seen in left mid to upper lung. More patchy mixed interstitial and airspace opacities are present in the lung bases, right greater than left. Stable calcified granuloma in the right lower lobe. Some fissural and septal thickening is present with indistinctness of the vascularity. Cardiomediastinal contours are stable with a calcified aorta. No visible pneumothorax. Trace bilateral effusions are present. IMPRESSION: Features suggesting pulmonary edema with bilateral effusions.  Atypical infection could have a similar appearance in the appropriate clinical setting. More masslike opacity in the left mid to upper lung. Could reflect an acute infection or inflammation though carcinoma is not excluded. Consider either short-term interval follow-up to assess for resolution or noncontrast chest CT for further characterization. Aortic Atherosclerosis (ICD10-I70.0). Electronically Signed   By: Lovena Le M.D.   On: 04/25/2019 06:48    Procedures Procedures  Medications Ordered in ED Medications  morphine 4 MG/ML injection 4 mg (4 mg Intravenous Given 04/25/19 1025)    ED Course  I have reviewed the triage vital signs and the nursing notes.  Pertinent labs & imaging results that were available during my care of the patient were reviewed by me and considered in my medical decision making (see chart for details).  MDM Rules/Calculators/A&P Paroxysmal atrial fibrillation.  Chest discomfort which has persisted.  When patient is placed on monitor after  arrival in the ED, he was noted to be in sinus rhythm.  He continues to have some chest discomfort, so will need serial troponins.  Old records are reviewed confirming history of paroxysmal atrial fibrillation which has required cardioversion in the past.  He is given a dose of morphine for pain.  ECG shows sinus rhythm, no acute changes.  Chest x-ray shows possible pulmonary edema, but he is not clinically in pulmonary edema.  Labs are pending.  Case is signed out to Dr. Sabra Heck.  CHA2DS2/VAS Stroke Risk Points  Current as of a minute ago     3 >= 2 Points: High Risk  1 - 1.99 Points: Medium Risk  0 Points: Low Risk    This is the only CHA2DS2/VAS Stroke Risk Points available for the past  year.: Last Change: N/A     Details    This score determines the patient's risk of having a stroke if the  patient has atrial fibrillation.       Points Metrics  0 Has Congestive Heart Failure:  No    Current as of a minute ago  1 Has Vascular Disease:  Yes    Current as of a minute ago  0 Has Hypertension:  No    Current as of a minute ago  2 Age:  46    Current as of a minute ago  0 Has Diabetes:  No    Current as of a minute ago  0 Had Stroke:  No  Had TIA:  No  Had thromboembolism:  No    Current as of a minute ago  0 Male:  No    Current as of a minute ago   Final Clinical Impression(s) / ED Diagnoses Final diagnoses:  Paroxysmal atrial fibrillation with rapid ventricular response ([place])  Chest discomfort    Rx / DC Orders ED Discharge Orders    None       Delora Fuel, MD 85/27/78 [phone]

## 2019-04-25 NOTE — ED Triage Notes (Signed)
Pt brought to ED by [facility] from home for c/o cp and afib with RVR. Pt has a hx of prior cardioversion for A fib, HR on EMS arrival 160-180 10 mg Cardizem IV given with some decrease on HR  To 90-100, HR back up to 140 another 50mg  Cardizem IV given with HR decrease to 60-80. Pt took 324 mg ASA and one nitro sl pta to ed. BP 120/60, R -18, 100% on RA. Pt is AO x 4 on arrival SR on the monitor.

## 2019-04-25 NOTE — ED Notes (Signed)
Patient verbalizes understanding of discharge instructions. Opportunity for questioning and answers were provided. Armband removed by staff, pt discharged from ED ambulatory w/ son

## 2019-04-25 NOTE — ED Provider Notes (Signed)
This patient is an 79 year old male with a known history of lung cancer treated and in remission, last saw his oncologist at the beginning of January at which time a CT scan at that time revealed no increases in size and no significant risk for aggressive disease, the patient is not currently getting chemotherapy or radiation.  He is getting CT scans every 3 months to monitor tumor size.  He presented with atrial fibrillation with rapid ventricular rate.  He had recently been seen at the urgent care for cough and chills which have been going on for a month.  She has some ongoing mild chest discomfort, his troponin has risen from 19-32  Reviewed medical record, August 2019 the patient had a stress test, which showed no significant ST changes, his last catheterization appears to be from 2012 at which time he had mild to moderate coronary disease, no interventions at that time.  On repeat evaluation the patient maintains sinus rhythm in fact he is in a mild sinus bradycardia  Discussed with cardiology at 12:20 PM, they recommend that the patient can be discharged home to follow-up in the outpatient setting as the slight rise in troponin is likely related to the atrial fibrillation.  The patient is not having any chest pain whatsoever and has not had any for the last couple of hours.  He remains in a stable condition feeling well, the family member at the bedside is been her translator and has adequately communicated these findings to the patient, he is agreeable to the plan  11:43 AM Cardiac monitoring reveals Sinus Bradycardia at 50 bpm (Rate & rhythm), as reviewed and interpreted by me. Cardiac monitoring was ordered due to Chest Pain and to monitor patient for dysrhythmia.     Noemi Chapel, MD 04/25/19 [phone]

## 2019-04-25 NOTE — Discharge Instructions (Signed)
Please follow-up with your cardiologist, they will call you to make an appointment.  Your testing today has been overall unremarkable, your heart rate has gone back to normal,  If you should develop increasing or severe or worsening symptoms including chest pain difficulty breathing fevers nausea vomiting or weakness please return to the emergency department immediately  Thank you for letting us take care of you today!  Please obtain all of your results from medical records or have your doctors office obtain the results - share them with your doctor - you should be seen at your doctors office in the next 2 days. Call today to arrange your follow up. Take the medications as prescribed. Please review all of the medicines and only take them if you do not have an allergy to them. Please be aware that if you are taking birth control pills, taking other prescriptions, ESPECIALLY ANTIBIOTICS may make the birth control ineffective - if this is the case, either do not engage in sexual activity or use alternative methods of birth control such as condoms until you have finished the medicine and your family doctor says it is OK to restart them. If you are on a blood thinner such as COUMADIN, be aware that any other medicine that you take may cause the coumadin to either work too much, or not enough - you should have your coumadin level rechecked in next 7 days if this is the case.  ? ?   You should return to the ER IMMEDIATELY if you develop severe or worsening symptoms.

## 2019-05-07 ENCOUNTER — Telehealth (INDEPENDENT_AMBULATORY_CARE_PROVIDER_SITE_OTHER): Payer: Medicare Other | Admitting: Cardiovascular Disease

## 2019-05-07 ENCOUNTER — Telehealth: Payer: Self-pay

## 2019-05-07 VITALS — BP 115/78 | Ht 72.0 in | Wt 152.0 lb

## 2019-05-07 DIAGNOSIS — I2510 Atherosclerotic heart disease of native coronary artery without angina pectoris: Secondary | ICD-10-CM

## 2019-05-07 DIAGNOSIS — C801 Malignant (primary) neoplasm, unspecified: Secondary | ICD-10-CM

## 2019-05-07 DIAGNOSIS — Z7901 Long term (current) use of anticoagulants: Secondary | ICD-10-CM

## 2019-05-07 DIAGNOSIS — R079 Chest pain, unspecified: Secondary | ICD-10-CM

## 2019-05-07 DIAGNOSIS — I480 Paroxysmal atrial fibrillation: Secondary | ICD-10-CM

## 2019-05-07 DIAGNOSIS — D696 Thrombocytopenia, unspecified: Secondary | ICD-10-CM

## 2019-05-07 DIAGNOSIS — E782 Mixed hyperlipidemia: Secondary | ICD-10-CM

## 2019-05-07 DIAGNOSIS — C3412 Malignant neoplasm of upper lobe, left bronchus or lung: Secondary | ICD-10-CM

## 2019-05-07 DIAGNOSIS — E7800 Pure hypercholesterolemia, unspecified: Secondary | ICD-10-CM

## 2019-05-07 MED ORDER — RIVAROXABAN 20 MG PO TABS: 20.0000 mg | ORAL_TABLET | Freq: Every day | ORAL | 3 refills | Status: DC

## 2019-05-07 MED ORDER — SIMVASTATIN 40 MG PO TABS: 40.0000 mg | ORAL_TABLET | Freq: Every day | ORAL | 3 refills | Status: DC

## 2019-05-07 MED ORDER — NITROGLYCERIN 0.4 MG SL SUBL: 0.4000 mg | SUBLINGUAL_TABLET | SUBLINGUAL | 3 refills | Status: DC | PRN

## 2019-05-07 MED ORDER — METOPROLOL TARTRATE 25 MG PO TABS: 25.0000 mg | ORAL_TABLET | Freq: Two times a day (BID) | ORAL | 3 refills | Status: DC

## 2019-05-07 NOTE — Telephone Encounter (Signed)
Spoke with pt's daughter regarding AVS from today's visit. Asked daughter if interpreter was needed, declined. Reviewed AVS and orders with daughter. She verbalized understanding and was agreeable. No other questions or concerns at this time.

## 2019-05-07 NOTE — Progress Notes (Signed)
Virtual Visit via Video Note   This visit type was conducted due to national recommendations for restrictions regarding the COVID-19 Pandemic (e.g. social distancing) in an effort to limit this patient's exposure and mitigate transmission in our community.  Due to his co-morbid illnesses, this patient is at least at moderate risk for complications without adequate follow up.  This format is felt to be most appropriate for this patient at this time.  All issues noted in this document were discussed and addressed.  A limited physical exam was performed with this format.  Please refer to the patient's chart for his consent to telehealth for [facility]   Date:  05/07/2019   ID:  Gerald [name], DOB 1940/12/11, MRN [id number]  Patient Location: Home Provider Location: Home  PCP:  Gerald Contras, MD  Cardiologist:  Gerald Mood, MD Electrophysiologist:  None   Evaluation Performed:  Follow-Up Visit of recent ER visit for CP and AF.  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 Between November 2012 in March 2013 he completed 6 cycles of carboplatin, etoposide. He is status post TEE guided cardioversion in 2011. Initially he was treated with Coumadin therapy. He develop recurrent atrial fibrillation in November 2012 and since that time has been on xarelto therapy.  Since I last saw him, he has seen several of the extenders.  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  In December 2017 he saw Gerald [name], and had nondiagnostic lateral T changes On  03/30/2016 a nuclear perfusion study was normal with note scar or ischemia.  Ejection fraction was 65%.  He has had some issues with bradycardia and his dose of her blocker had been  reduced.  He was recently seen in the emergency room in November 2018 and was in atrial fibrillation with rapid ventricular response.  He underwent cardioversion in the emergency room.  He was seen by Gerald [name] in follow-up.  He was bradycardic and as result, he was unable to further increase his metoprolol.  I last saw him in February 2019.  At that time he was doing well.  He was on metoprolol 25 mg twice a day in addition to simvastatin 40 mg and Xarelto 20 g daily.  He denied palpitations, chest pain, awareness of atrial fibrillation. Acute myeloid leukemia Over the past 9 months, Gerald [name] continued to feel well.  He was seen in August 2019 by [name] and at that time family had noticed that he had taken 2 sublingual nitroglycerin over the month previously.  He had atypical nonexertional chest pain.  He underwent a nuclear perfusion study on November 23, 2017 which continued to show normal perfusion.  There were no ECG changes.  Post stress ejection fraction was 64%.  Subsequently, he has continued to do well and specifically denies chest pain, PND orthopnea.  He is unaware of palpitations presyncope or syncope.   Since his last office visit with me, he has been diagnosed with acute myeloid leukemia also was found to have additional lung nodules.  He has undergone radiation therapy last month.  On the evening of April 25, 2019 while sleeping he was awakened with his heart racing and also a sensation of chest heaviness.  EMS  was called and he was found to be in atrial fibrillation with rapid ventricular response.  Presented to the emergency room and was given aspirin, nitroglycerin, and intravenous diltiazem.  He was anticoagulated on Xarelto.  L on the monitor he converted to sinus rhythm.  Follow-up Cardiologic evaluation was advised.  He now presents in a telemedicine visit for further evaluation.  The patient speaks [place] but his son was with him during the interview who acted as the  interpreter.  The patient does not have symptoms concerning for COVID-19 infection (fever, chills, cough, or new shortness of breath).    Past Medical History:  Diagnosis Date  . A-fib ([place])   . Allergy    seasonal allergies  . AML (acute myeloid leukemia) ([place]) 04/24/2013  . CAD (coronary artery disease)    last cath 2008 with noncritical CAD  . GERD (gastroesophageal reflux disease)   . Headache 12/17/2012  . Hyperlipidemia   . Leukemia ([place])   . Other pancytopenia ([place]) 04/09/2013  . Small cell lung cancer ([place]) 12/2010  . Smoking   . Unspecified deficiency anemia 04/16/2013   Past Surgical History:  Procedure Laterality Date  . CARDIAC CATHETERIZATION  01/12/2007   noncritical CAD, no change from cath of 05/20/2005. 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  Marland Kitchen NM MYOCAR PERF WALL MOTION  01/23/2007   mild perfusion defect seen in the basal inferior and mid inferior regions - c/w attenuation defect. No ischemia present.     Current Meds  Medication Sig  . albuterol (PROVENTIL HFA;VENTOLIN HFA) 108 (90 Base) MCG/ACT inhaler Inhale 1-2 puffs into the lungs every 6 (six) hours as needed for wheezing or shortness of breath.  . metoprolol tartrate (LOPRESSOR) 25 MG tablet TAKE 1 TABLET BY MOUTH TWICE A DAY  . nitroGLYCERIN (NITROSTAT) 0.4 MG SL tablet Place 1 tablet (0.4 mg total) under the tongue every 5 (five) minutes as needed for chest pain.  . simvastatin (ZOCOR) 40 MG tablet Take 1 tablet (40 mg total) by mouth at bedtime.  Marland Kitchen VITAMIN D PO Take 1 tablet by mouth.  Alveda Reasons 20 MG TABS tablet TAKE 1 TABLET BY MOUTH EVERY DAY WITH SUPPER  . [DISCONTINUED] benzonatate (TESSALON) 100 MG capsule Take 1 capsule (100 mg total) by mouth every 8 (eight) hours.  . [DISCONTINUED] doxycycline (VIBRAMYCIN) 100 MG capsule Take 1 capsule (100 mg total) by mouth 2 (two) times daily.     Allergies:   Patient has no known allergies.   Social  History   Tobacco Use  . Smoking status: Former Smoker    Packs/day: 1.00    Years: 50.00    Pack years: 50.00    Types: Cigarettes    Quit date: 03/28/2006    Years since quitting: 13.1  . Smokeless tobacco: Never Used  Substance Use Topics  . Alcohol use: No  . Drug use: No     Family Hx: The patient's family history includes Healthy in his mother and son; Thyroid cancer in his sister.  ROS:   Please see the history of present illness.    No fevers chills or night sweat No cough No awareness of recurrent atrial fibrillation since his ER visit No chest pain with walking No bleeding No swelling Sleeping well All other systems reviewed and are negative.   Prior CV studies:   The following studies were reviewed today:  Lexiscan Myoview 03/29/2016 Study Highlights     The left ventricular ejection  fraction is normal (55-65%).  Nuclear stress EF: 65%.  There was no ST segment deviation noted during stress.  The study is normal.  Normal study, no evidence for ischemia or infarction.     Carlton Adam Myoview 11/23/2017 Study Highlights     The left ventricular ejection fraction is normal (55-65%).  Nuclear stress EF: 64%.  There was no ST segment deviation noted during stress.  The study is normal.  1. EF 64%, normal wall motion.  2. No evidence for ischemia or infarction by perfusion images.   Normal study.       Labs/Other Tests and Data Reviewed:    EKG:  An ECG dated April 25, 2019 was personally reviewed today and demonstrated: Sinus rhythm.  There is baseline artifact.  Recent Labs: 04/25/2019: BUN 17; Creatinine, Ser 0.99; Hemoglobin 13.4; Platelets 116; Potassium 3.8; Sodium 139   Recent Lipid Panel Lab Results  Component Value Date/Time   CHOL  09/11/2009 04:45 AM    121        ATP III CLASSIFICATION:  <200     mg/dL   Desirable  200-239  mg/dL   Borderline High  >=240    mg/dL   High          TRIG 29 09/11/2009 04:45 AM    HDL 41 09/11/2009 04:45 AM   CHOLHDL 3.0 09/11/2009 04:45 AM   LDLCALC  09/11/2009 04:45 AM    74        Total Cholesterol/HDL:CHD Risk Coronary Heart Disease Risk Table                     Men   Women  1/2 Average Risk   3.4   3.3  Average Risk       5.0   4.4  2 X Average Risk   9.6   7.1  3 X Average Risk  23.4   11.0        Use the calculated Patient Ratio above and the CHD Risk Table to determine the patient's CHD Risk.        ATP III CLASSIFICATION (LDL):  <100     mg/dL   Optimal  100-129  mg/dL   Near or Above                    Optimal  130-159  mg/dL   Borderline  160-189  mg/dL   High  >190     mg/dL   Very High    Wt Readings from Last 3 Encounters:  05/07/19 152 lb (68.9 kg)  02/19/18 158 lb 6.4 oz (71.8 kg)  11/23/17 160 lb (72.6 kg)     Objective:    Vital Signs:  BP 115/78   Ht 6' (1.829 m)   Wt 152 lb (68.9 kg)   BMI 20.61 kg/m    This was a video telemedicine visit. The patient appeared in no acute distress. Breathing was normal and not labored No audible wheezing His heart rhythm remained regular He did not have any chest wall tenderness to palpation He did not have abdominal tenderness to palpation There was no swelling He had normal affect    ASSESSMENT & PLAN:    1. PAF: Patient has a history of PAF and has been on anticoagulation therapy.  He recently developed an episode of rapid atrial fibrillation while sleeping associated with chest heaviness and pressure leading to his ER presentation.  With IV of diltiazem he pharmacologically cardioverted to  sinus rhythm.  He continues to be on Xarelto.  I have recommended a follow-up echo Doppler study.  Currently he is in sinus rhythm on metoprolol 25 mg twice a day. 2. Chest pain: This occurred in the setting of AF.  It  is uncertain if the chest pain preceded the atrial fibrillation or the rapid atrial fibrillation induced the chest pain.  I have recommended the patient undergo a follow-up  Lexiscan Myoview study to further evaluate potential ischemic etiology. 3. Hyperlipidemia: Continues to be on simvastatin. 4. Primary cancer of left upper lobe of lung: Underwent radiation therapy last month. 5. 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 Remote history of small cell CA of lung  COVID-19 Education: The signs and symptoms of COVID-19 were discussed with the patient and how to seek care for testing (follow up with PCP or arrange E-visit).  The importance of social distancing was discussed today.  Time:   Today, I have spent 25 minutes with the patient with telehealth technology discussing the above problems.     Medication Adjustments/Labs and Tests Ordered: Current medicines are reviewed at length with the patient today.  Concerns regarding medicines are outlined above.   Tests Ordered: No orders of the defined types were placed in this encounter.   Medication Changes: No orders of the defined types were placed in this encounter.   Follow Up: In person in 4 to 6 weeks  Signed, Shelva Majestic, MD  05/07/2019 10:19 AM    [place]

## 2019-05-07 NOTE — Patient Instructions (Signed)
Medication Instructions:  CONTINUE WITH CURRENT MEDICATIONS. NO CHANGES. *If you need a refill on your cardiac medications before your next appointment, please call your pharmacy*  Testing/Procedures: Your physician has requested that you have an echocardiogram. Echocardiography is a painless test that uses sound waves to create images of your heart. It provides your doctor with information about the size and shape of your heart and how well your heart's chambers and valves are working. This procedure takes approximately one hour. There are no restrictions for this procedure.  [place] has requested that you have a lexiscan myoview. For further information please visit [email or web address]. Please follow instruction sheet, as given.  DONE AT DR.KELLY'S OFFICE  Follow-Up: FOLLOWING YOUR TESTS: At [facility], you and your health needs are our priority.  As part of our continuing mission to provide you with exceptional heart care, we have created designated Provider Care Teams.  These Care Teams include your primary Cardiologist (physician) and Advanced Practice Providers (APPs -  Physician Assistants and Nurse Practitioners) who all work together to provide you with the care you need, when you need it.  Your next appointment:   4-6 week(s)  The format for your next appointment:   In Person  Provider:   Shelva Majestic, MD  Other Instructions PLEASE [place]. [place]

## 2019-05-09 ENCOUNTER — Encounter: Payer: Self-pay | Admitting: Cardiovascular Disease

## 2019-05-12 ENCOUNTER — Ambulatory Visit: Payer: Medicare Other | Attending: Internal Medicine

## 2019-05-12 DIAGNOSIS — Z23 Encounter for immunization: Secondary | ICD-10-CM | POA: Insufficient documentation

## 2019-05-12 NOTE — Progress Notes (Signed)
   Covid-19 Vaccination Clinic  Name:  Gerald [name]    MRN: [id number] DOB: 1940/12/13  05/12/2019  Gerald [name] was observed post Covid-19 immunization for 15 minutes without incidence. He was provided with Vaccine Information Sheet and instruction to access the V-Safe system.   Gerald [name] was instructed to call 911 with any severe reactions post vaccine: Marland Kitchen Difficulty breathing  . Swelling of your face and throat  . A fast heartbeat  . A bad rash all over your body  . Dizziness and weakness    Immunizations Administered    Name Date Dose VIS Date Route   Pfizer COVID-19 Vaccine 05/12/2019 12:35 PM 0.3 mL 03/08/2019 Intramuscular   Manufacturer: [place]   Lot: [id number]   [place]: [id number]

## 2019-05-27 ENCOUNTER — Telehealth (HOSPITAL_COMMUNITY): Payer: Self-pay

## 2019-05-27 NOTE — Telephone Encounter (Signed)
Detailed instructions left on the patient's answering per his DPR. Asked to call back with any questions. S.[place] EMTP

## 2019-05-28 ENCOUNTER — Other Ambulatory Visit: Payer: Self-pay

## 2019-05-28 ENCOUNTER — Ambulatory Visit (HOSPITAL_BASED_OUTPATIENT_CLINIC_OR_DEPARTMENT_OTHER): Payer: Medicare Other

## 2019-05-28 ENCOUNTER — Ambulatory Visit (HOSPITAL_COMMUNITY): Payer: Medicare Other | Attending: Cardiology

## 2019-05-28 ENCOUNTER — Encounter (HOSPITAL_COMMUNITY): Payer: Self-pay | Admitting: *Deleted

## 2019-05-28 DIAGNOSIS — I480 Paroxysmal atrial fibrillation: Secondary | ICD-10-CM | POA: Diagnosis present

## 2019-05-28 LAB — ECHOCARDIOGRAM COMPLETE
Height: 68 in
Weight: 2528 oz

## 2019-05-28 LAB — MYOCARDIAL PERFUSION IMAGING
LV dias vol: 65 mL (ref 62–150)
LV sys vol: 24 mL
Peak HR: 75 {beats}/min
Rest HR: 59 {beats}/min
SDS: 0
SRS: 0
SSS: 0
TID: 0.98

## 2019-05-28 MED ORDER — TECHNETIUM TC 99M TETROFOSMIN IV KIT
10.2000 | PACK | Freq: Once | INTRAVENOUS | Status: AC | PRN
  Administered 2019-05-28: 10.2 via INTRAVENOUS
  Filled 2019-05-28: qty 11

## 2019-05-28 MED ORDER — TECHNETIUM TC 99M TETROFOSMIN IV KIT
32.5000 | PACK | Freq: Once | INTRAVENOUS | Status: AC | PRN
  Administered 2019-05-28: 32.5 via INTRAVENOUS
  Filled 2019-05-28: qty 33

## 2019-05-28 MED ORDER — REGADENOSON 0.4 MG/5ML IV SOLN
0.4000 mg | Freq: Once | INTRAVENOUS | Status: AC
  Administered 2019-05-28: 0.4 mg via INTRAVENOUS

## 2019-05-28 NOTE — Progress Notes (Signed)
Patient speaks Gerald [name] and Interpreter Marijean Bravo interpreted the explanation of test to patient.[name], [name]

## 2019-06-04 ENCOUNTER — Ambulatory Visit: Payer: Medicare Other | Attending: Internal Medicine

## 2019-06-04 DIAGNOSIS — Z23 Encounter for immunization: Secondary | ICD-10-CM | POA: Insufficient documentation

## 2019-06-04 NOTE — Progress Notes (Signed)
   Covid-19 Vaccination Clinic  Name:  Tore Carreker    MRN: [id number] DOB: 12/22/1940  06/04/2019  Mr. Meeker was observed post Covid-19 immunization for 15 minutes without incident. He was provided with Vaccine Information Sheet and instruction to access the V-Safe system.   Mr. Code was instructed to call 911 with any severe reactions post vaccine: Marland Kitchen Difficulty breathing  . Swelling of face and throat  . A fast heartbeat  . A bad rash all over body  . Dizziness and weakness   Immunizations Administered    Name Date Dose VIS Date Route   Pfizer COVID-19 Vaccine 06/04/2019  5:08 PM 0.3 mL 03/08/2019 Intramuscular   Manufacturer: [place]   Lot: [id number]   [place]: [id number]

## 2019-07-09 ENCOUNTER — Telehealth: Payer: Self-pay | Admitting: Cardiovascular Disease

## 2019-07-09 NOTE — Telephone Encounter (Signed)
Gerald [name] is requesting results from echocardiogram completed on 05/28/19. Please return call to discuss and leave a detailed voice message if [place] is not available.

## 2019-07-10 NOTE — Telephone Encounter (Signed)
Called and left a detail message on phone per DPR and daughter's request, regarding Echo results. Notified that if there were any questions to please call the office back.

## 2019-07-11 ENCOUNTER — Telehealth: Payer: Self-pay | Admitting: Cardiovascular Disease

## 2019-07-11 NOTE — Telephone Encounter (Signed)
Spoke with pt's daughter who report pt experienced CP on Monday and again today. She voiced symptoms are associated with chest tightness and some SOB. Daughter report symptoms both times were relieved with 1 Nitro. Appointment scheduled for tomorrow 4/16 for further evaluations. Daughter advised to have pt report to ER if symptoms reoccur. Daughter verbalized understanding.

## 2019-07-11 NOTE — Telephone Encounter (Signed)
Pt c/o of Chest Pain: STAT if CP now or developed within 24 hours  1. Are you having CP right now? yes  2. Are you experiencing any other symptoms (ex. SOB, nausea, vomiting, sweating)? SOB  3. How long have you been experiencing CP? Started Monday   4. Is your CP continuous or coming and going? Started Monday and came back this morning  5. Have you taken Nitroglycerin? Yes   Patient's daughter states the patient started having chest tightness Monday. She states he was told to take metoprolol every 5 minutes and it helped. She states it started again today and he is still having the tightness. She states she is not with the patient now and that her brother is. ?

## 2019-07-12 ENCOUNTER — Ambulatory Visit (INDEPENDENT_AMBULATORY_CARE_PROVIDER_SITE_OTHER): Payer: Medicare Other | Admitting: Physician Assistant

## 2019-07-12 ENCOUNTER — Other Ambulatory Visit: Payer: Self-pay

## 2019-07-12 ENCOUNTER — Encounter: Payer: Self-pay | Admitting: Physician Assistant

## 2019-07-12 VITALS — BP 116/84 | HR 149 | Temp 97.2°F | Ht 68.0 in | Wt 154.6 lb

## 2019-07-12 DIAGNOSIS — I4891 Unspecified atrial fibrillation: Secondary | ICD-10-CM | POA: Diagnosis not present

## 2019-07-12 DIAGNOSIS — C3490 Malignant neoplasm of unspecified part of unspecified bronchus or lung: Secondary | ICD-10-CM | POA: Diagnosis not present

## 2019-07-12 DIAGNOSIS — I2510 Atherosclerotic heart disease of native coronary artery without angina pectoris: Secondary | ICD-10-CM | POA: Diagnosis not present

## 2019-07-12 NOTE — Progress Notes (Signed)
Cardiology Office Note:    Date:  07/14/2019   ID:  Gerald [name], Gerald [name] 08-24-1940, MRN [id number]  PCP:  Gerald Contras, MD  Cardiologist:  Gerald Majestic, MD  Electrophysiologist:  None   Referring MD: Gerald Contras, MD   Chief Complaint  Patient presents with  . Follow-up    seen for Gerald [name].     History of Present Illness:    Gerald [name] is a 79 y.o. 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 He underwent TEE guided cardioversion in 2011. He was treated with Coumadin therapy. He developed recurrent atrial fibrillation in November 2012 and has since been switched to Xarelto therapy. More remotely, it appears he had a cardiac catheterization on 05/20/2005 and again on 01/12/2007. In 2007, he did not have significant coronary artery disease, EF was 60%. Cardiac catheterization in 2008 did show nonobstructive CAD with eccentric 30-40% narrowing in mid RCA, 40% proximal LAD lesion, EF 60%.  Myoview August 2019 showed normal perfusion, EF 64%.  More recently, patient has been diagnosed with acute myeloid leukemia and found to have additional lung nodules.  He underwent radiation therapy.  Patient had recurrent atrial fibrillation with RVR in January 2021 and was seen in the ED.  He converted to sinus rhythm on IV Cardizem.  CT angiogram of the chest obtained on 04/25/2019 showed a 3.4 x 2.4 cm left upper lobe nodule concerning for a focus of neoplasm, ascending thoracic aorta measuring at 4.3 x 4.2 cm, coronary artery calcification.  He was last seen for post hospital follow-up by Gerald [name] on 05/07/2019.  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  Myoview obtained on 05/28/2019 showed EF 62%, normal  perfusion.  Patient presents today for cardiology office visit along with her daughter.  Being the needs translator Gerald [name] was also present.  Although initial blood pressure was stable, however patient says he has been having more dizziness since onset of palpitation and chest discomfort around 5 days ago on Monday.  Blood pressure today via my manual recheck was 100/56  EKG showed a atrial fibrillation with RVR, heart rate of 149, T wave inversion in V2 and V3.  According to her daughter, symptoms started Monday night with dizziness, chest discomfort and the palpitation.  His symptom went away by Wednesday, however came back Thursday morning and persisted until now.  On further investigation, it appears instead of 25 mg twice daily of metoprolol, he has been taking 25 mg daily instead.  Since patient had a history of self converted back to sinus rhythm on higher dose of rate control therapy, I recommended start taking metoprolol tartrate at 25 mg twice daily.  I will hold off on adding amiodarone at this point.  Otherwise he has been compliant with anticoagulation therapy Xarelto.  I suspect he will self convert in the next 24 to 48 hours.  As far as his borderline low blood pressure, I recommend adequate hydration between 32 to 64 ounces.  I plan to bring the patient back next Tuesday for reassessment, I suspect he will self converted prior to that time.  Past Medical History:  Diagnosis Date  . A-fib ([place])   . Allergy    seasonal allergies  . AML (  acute myeloid leukemia) ([place]) 04/24/2013  . CAD (coronary artery disease)    last cath 2008 with noncritical CAD  . GERD (gastroesophageal reflux disease)   . Headache 12/17/2012  . Hyperlipidemia   . Leukemia ([place])   . Other pancytopenia ([place]) 04/09/2013  . Small cell lung cancer ([place]) 12/2010  . Smoking   . Unspecified deficiency anemia 04/16/2013    Past Surgical History:  Procedure Laterality Date  . CARDIAC CATHETERIZATION  01/12/2007    noncritical CAD, no change from cath of 05/20/2005. 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  Marland Kitchen NM MYOCAR PERF WALL MOTION  01/23/2007   mild perfusion defect seen in the basal inferior and mid inferior regions - c/w attenuation defect. No ischemia present.    Current Medications: Current Meds  Medication Sig  . metoprolol tartrate (LOPRESSOR) 25 MG tablet Take 1 tablet (25 mg total) by mouth 2 (two) times daily.  . nitroGLYCERIN (NITROSTAT) 0.4 MG SL tablet Place 1 tablet (0.4 mg total) under the tongue every 5 (five) minutes as needed for chest pain.  . rivaroxaban (XARELTO) 20 MG TABS tablet Take 1 tablet (20 mg total) by mouth daily with supper.  . simvastatin (ZOCOR) 40 MG tablet Take 1 tablet (40 mg total) by mouth at bedtime.  Marland Kitchen VITAMIN D PO Take 1 tablet by mouth.  . [DISCONTINUED] albuterol (PROVENTIL HFA;VENTOLIN HFA) 108 (90 Base) MCG/ACT inhaler Inhale 1-2 puffs into the lungs every 6 (six) hours as needed for wheezing or shortness of breath.     Allergies:   Patient has no known allergies.   Social History   Socioeconomic History  . Marital status: Married    Spouse name: Not on file  . Number of children: Not on file  . Years of education: Not on file  . Highest education level: Not on file  Occupational History  . Not on file  Tobacco Use  . Smoking status: Former Smoker    Packs/day: 1.00    Years: 50.00    Pack years: 50.00    Types: Cigarettes    Quit date: 03/28/2006    Years since quitting: 13.3  . Smokeless tobacco: Never Used  Substance and Sexual Activity  . Alcohol use: No  . Drug use: No  . Sexual activity: Not on file  Other Topics Concern  . Not on file  Social History Narrative  . Not on file   Social Determinants of Health   Financial Resource Strain:   . Difficulty of Paying Living Expenses:   Food Insecurity:   . Worried About Charity fundraiser in the Last Year:   . Arboriculturist in the  Last Year:   Transportation Needs:   . Film/video editor (Medical):   Marland Kitchen Lack of Transportation (Non-Medical):   Physical Activity:   . Days of Exercise per Week:   . Minutes of Exercise per Session:   Stress:   . Feeling of Stress :   Social Connections:   . Frequency of Communication with Friends and Family:   . Frequency of Social Gatherings with Friends and Family:   . Attends Religious Services:   . Active Member of Clubs or Organizations:   . Attends Archivist Meetings:   Marland Kitchen Marital Status:      Family History: The patient's family history includes Healthy in his mother and son; Thyroid cancer in his sister.  ROS:   Please see the history of  present illness.     All other systems reviewed and are negative.  EKGs/Labs/Other Studies Reviewed:    The following studies were reviewed today:  Echo 05/28/2019 1. Left ventricular ejection fraction, by estimation, is 65 to 70%. The  left ventricle has normal function. The left ventricle has no regional  wall motion abnormalities. Left ventricular diastolic parameters are  consistent with Grade I diastolic  dysfunction (impaired relaxation).  2. Right ventricular systolic function is normal. The right ventricular  size is normal. There is mildly elevated pulmonary artery systolic  pressure.  3. The mitral valve is abnormal. Trivial mitral valve regurgitation.  4. The aortic valve is tricuspid. Aortic valve regurgitation is mild to  moderate. Mild aortic valve sclerosis is present, with no evidence of  aortic valve stenosis.  5. Aortic dilatation noted. There is mild dilatation of the ascending  aorta measuring 40 mm.  6. The inferior vena cava is normal in size with greater than 50%  respiratory variability, suggesting right atrial pressure of 3 mmHg.   Myoview 05/28/2019  Nuclear stress EF: 62%.  The left ventricular ejection fraction is normal (55-65%).  There was no ST segment deviation noted during  stress.  The study is normal.  This is a low risk study.  EKG:  EKG is ordered today.  The ekg ordered today demonstrates atrial fibrillation with RVR, heart rate 147, T wave inversion in the anterior leads.  Recent Labs: 07/14/2019: BUN 18; Creatinine, Ser 1.28; Hemoglobin 13.0; Platelets 132; Potassium 4.0; Sodium 139  Recent Lipid Panel    Component Value Date/Time   CHOL  09/11/2009 0445    121        ATP III CLASSIFICATION:  <200     mg/dL   Desirable  200-239  mg/dL   Borderline High  >=240    mg/dL   High          TRIG 29 09/11/2009 0445   HDL 41 09/11/2009 0445   CHOLHDL 3.0 09/11/2009 0445   VLDL 6 09/11/2009 0445   LDLCALC  09/11/2009 0445    74        Total Cholesterol/HDL:CHD Risk Coronary Heart Disease Risk Table                     Men   Women  1/2 Average Risk   3.4   3.3  Average Risk       5.0   4.4  2 X Average Risk   9.6   7.1  3 X Average Risk  23.4   11.0        Use the calculated Patient Ratio above and the CHD Risk Table to determine the patient's CHD Risk.        ATP III CLASSIFICATION (LDL):  <100     mg/dL   Optimal  100-129  mg/dL   Near or Above                    Optimal  130-159  mg/dL   Borderline  160-189  mg/dL   High  >190     mg/dL   Very High    Physical Exam:    VS:  BP 116/84   Pulse (!) 149   Temp (!) 97.2 F (36.2 C)   Ht 5\' 8"  (1.727 m)   Wt 154 lb 9.6 oz (70.1 kg)   SpO2 92%   BMI 23.51 kg/m     Wt Readings from Last  3 Encounters:  07/12/19 154 lb 9.6 oz (70.1 kg)  05/28/19 158 lb (71.7 kg)  05/07/19 152 lb (68.9 kg)     GEN:  Well nourished, well developed in no acute distress HEENT: Normal NECK: No JVD; No carotid bruits LYMPHATICS: No lymphadenopathy CARDIAC: RRR, no murmurs, rubs, gallops RESPIRATORY:  Clear to auscultation without rales, wheezing or rhonchi  ABDOMEN: Soft, non-tender, non-distended MUSCULOSKELETAL:  No edema; No deformity  SKIN: Warm and dry NEUROLOGIC:  Alert and oriented x  3 PSYCHIATRIC:  Normal affect   ASSESSMENT:    1. Atrial fibrillation with RVR ([place])   2. Coronary artery disease involving native coronary artery of native heart without angina pectoris   3. Small cell lung cancer ([place])    PLAN:    In order of problems listed above:  1. Atrial fibrillation: Blood pressure borderline, I instructed the patient to increase fluid hydration.  Apparently he has been only taking metoprolol 25 mg daily instead of twice a day as instructed.  I discussed the case with Gerald [name], he had a history of self converting with good rate control therapy.  I recommended he start taking metoprolol 25 mg twice daily to see if he can convert on the higher dose of beta-blocker.  I plan to see the patient back early next week for reassessment.  He has been compliant with anticoagulation therapy.  Case has been discussed with Gerald [name] who was agreeable with the plan.  2. CAD: Recent Myoview obtained in March 2021 was low risk  3. Small cell lung cancer: Underwent chemotherapy.   Medication Adjustments/Labs and Tests Ordered: Current medicines are reviewed at length with the patient today.  Concerns regarding medicines are outlined above.  Orders Placed This Encounter  Procedures  . EKG 12-Lead   No orders of the defined types were placed in this encounter.   Patient Instructions  Medication Instructions:   TAKE Metoprolol Tartrate 25 mg 2 times a day  *If you need a refill on your cardiac medications before your next appointment, please call your pharmacy*  Lab Work: NONE ordered at this time of appointment   If you have labs (blood work) drawn today and your tests are completely normal, you will receive your results only by: Marland Kitchen MyChart Message (if you have MyChart) OR . A paper copy in the mail If you have any lab test that is abnormal or we need to change your treatment, we will call you to review the results.  Testing/Procedures: NONE ordered at this time of  appointment   Follow-Up: At [facility], you and your health needs are our priority.  As part of our continuing mission to provide you with exceptional heart care, we have created designated Provider Care Teams.  These Care Teams include your primary Cardiologist (physician) and Advanced Practice Providers (APPs -  Physician Assistants and Nurse Practitioners) who all work together to provide you with the care you need, when you need it.  Your next appointment:   4 day(s)  The format for your next appointment:   In Person  Provider:   Almyra Deforest, [name]  Other Instructions  Fluid intake should be 32-64 oz per day     SignedAlmyra Deforest, PA  07/14/2019 10:40 PM    [facility]

## 2019-07-12 NOTE — Patient Instructions (Signed)
Medication Instructions:   TAKE Metoprolol Tartrate 25 mg 2 times a day  *If you need a refill on your cardiac medications before your next appointment, please call your pharmacy*  Lab Work: NONE ordered at this time of appointment   If you have labs (blood work) drawn today and your tests are completely normal, you will receive your results only by: Marland Kitchen MyChart Message (if you have MyChart) OR . A paper copy in the mail If you have any lab test that is abnormal or we need to change your treatment, we will call you to review the results.  Testing/Procedures: NONE ordered at this time of appointment   Follow-Up: At [facility], you and your health needs are our priority.  As part of our continuing mission to provide you with exceptional heart care, we have created designated Provider Care Teams.  These Care Teams include your primary Cardiologist (physician) and Advanced Practice Providers (APPs -  Physician Assistants and Nurse Practitioners) who all work together to provide you with the care you need, when you need it.  Your next appointment:   4 day(s)  The format for your next appointment:   In Person  Provider:   Almyra Deforest, [name]  Other Instructions  Fluid intake should be 32-64 oz per day

## 2019-07-14 ENCOUNTER — Emergency Department (HOSPITAL_COMMUNITY): Payer: Medicare Other

## 2019-07-14 ENCOUNTER — Encounter (HOSPITAL_COMMUNITY): Payer: Self-pay

## 2019-07-14 ENCOUNTER — Emergency Department (HOSPITAL_COMMUNITY)
Admission: EM | Admit: 2019-07-14 | Discharge: 2019-07-14 | Disposition: A | Discharge status: Home / Self Care | Payer: Medicare Other | Attending: Emergency Medicine | Admitting: Emergency Medicine

## 2019-07-14 ENCOUNTER — Encounter: Payer: Self-pay | Admitting: Physician Assistant

## 2019-07-14 ENCOUNTER — Other Ambulatory Visit: Payer: Self-pay

## 2019-07-14 DIAGNOSIS — M549 Dorsalgia, unspecified: Secondary | ICD-10-CM | POA: Diagnosis present

## 2019-07-14 DIAGNOSIS — I4891 Unspecified atrial fibrillation: Secondary | ICD-10-CM | POA: Insufficient documentation

## 2019-07-14 DIAGNOSIS — Z5321 Procedure and treatment not carried out due to patient leaving prior to being seen by health care provider: Secondary | ICD-10-CM | POA: Diagnosis not present

## 2019-07-14 LAB — CBC
HCT: 38.3 % — ABNORMAL LOW (ref 39.0–52.0)
Hemoglobin: 13 g/dL (ref 13.0–17.0)
MCH: 33.6 pg (ref 26.0–34.0)
MCHC: 33.9 g/dL (ref 30.0–36.0)
MCV: 99 fL (ref 80.0–100.0)
Platelets: 132 10*3/uL — ABNORMAL LOW (ref 150–400)
RBC: 3.87 MIL/uL — ABNORMAL LOW (ref 4.22–5.81)
RDW: 13.1 % (ref 11.5–15.5)
WBC: 5.1 10*3/uL (ref 4.0–10.5)
nRBC: 0 % (ref 0.0–0.2)

## 2019-07-14 LAB — BASIC METABOLIC PANEL
Anion gap: 8 (ref 5–15)
BUN: 18 mg/dL (ref 8–23)
CO2: 22 mmol/L (ref 22–32)
Calcium: 9 mg/dL (ref 8.9–10.3)
Chloride: 109 mmol/L (ref 98–111)
Creatinine, Ser: 1.28 mg/dL — ABNORMAL HIGH (ref 0.61–1.24)
GFR calc Af Amer: >60 mL/min (ref >60)
GFR calc non Af Amer: 53 mL/min — ABNORMAL LOW (ref >60)
Glucose, Bld: 121 mg/dL — ABNORMAL HIGH (ref 70–99)
Potassium: 4 mmol/L (ref 3.5–5.1)
Sodium: 139 mmol/L (ref 135–145)

## 2019-07-14 LAB — TROPONIN I (HIGH SENSITIVITY): Troponin I (High Sensitivity): 7 ng/L (ref <18)

## 2019-07-14 MED ORDER — SODIUM CHLORIDE 0.9% FLUSH: 3.0000 mL | Freq: Once | INTRAVENOUS | Status: DC

## 2019-07-14 NOTE — ED Notes (Signed)
Pt has decided to leave and see his cardiologist in the morning

## 2019-07-14 NOTE — ED Triage Notes (Signed)
Pt arrives to ED w/ c/o back pain. Pt has hx of a-fib and had lopressor dose increased. Pt presents to ED w/ back pain. Pt denies chest pain, SOB, n/v. Pt reports 8/10 pain.

## 2019-07-15 ENCOUNTER — Telehealth: Payer: Self-pay | Admitting: Cardiovascular Disease

## 2019-07-15 NOTE — Telephone Encounter (Signed)
Spoke to patient's daughter Billey Co.She stated father went to ED last night with pain in mid back radiating into right shoulder.Stated he had lab and chest xray done.He was told to call office for results.Advised I will send message to [facility] for review.Stated father continues to have pain in mid back with pain radiating up into right shoulder off and on.No pain at present.Advised to keep appointment with Almyra Deforest PA 4/20 at 12:15 pm.Advised to take NTG if needed.Advised to go to ED if needed.

## 2019-07-15 NOTE — Telephone Encounter (Signed)
Billey Co the patient's daughter is calling requesting the results of the patient's labs performed in the hospital yesterday 07/14/19. She states the hospital advised him that if the results were bad he would need to be seen again today 07/15/19 either by the ED or in Dr. Evette Georges office. Please advise.

## 2019-07-16 ENCOUNTER — Ambulatory Visit (INDEPENDENT_AMBULATORY_CARE_PROVIDER_SITE_OTHER): Payer: Medicare Other | Admitting: Physician Assistant

## 2019-07-16 ENCOUNTER — Other Ambulatory Visit: Payer: Self-pay

## 2019-07-16 ENCOUNTER — Encounter: Payer: Self-pay | Admitting: Physician Assistant

## 2019-07-16 VITALS — BP 100/68 | HR 94 | Temp 98.1°F | Ht 68.0 in | Wt 155.2 lb

## 2019-07-16 DIAGNOSIS — I4891 Unspecified atrial fibrillation: Secondary | ICD-10-CM | POA: Diagnosis not present

## 2019-07-16 DIAGNOSIS — Z01810 Encounter for preprocedural cardiovascular examination: Secondary | ICD-10-CM

## 2019-07-16 NOTE — Progress Notes (Signed)
Cardiology Office Note:    Date:  07/18/2019   ID:  Tahmid, Stonehocker [birth year], MRN [id number]  PCP:  Antony Contras, MD  Cardiologist:  Shelva Majestic, MD  Electrophysiologist:  None   Referring MD: Antony Contras, MD   Chief Complaint  Patient presents with  . Follow-up    History of Present Illness:    Gerald [name] is a 79 y.o. 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 He underwent TEE guided cardioversion in 2011. He was treated with Coumadin therapy. He developed recurrent atrial fibrillation in November 2012 and has since been switched to Xarelto therapy. More remotely, it appears he had a cardiac catheterization on 05/20/2005 and again on 01/12/2007. In 2007, he did not have significant coronary artery disease, EF was 60%.Cardiac catheterization in 2008 did show nonobstructive CAD with eccentric 30-40% narrowing in mid RCA, 40% proximal LAD lesion, EF 60%.  Myoview August 2019 showed normal perfusion, EF 64%.  More recently, patient has been diagnosed with acute myeloid leukemia and found to have additional lung nodules.  He underwent radiation therapy.  Patient had recurrent atrial fibrillation with RVR in January 2021 and was seen in the ED.  He converted to sinus rhythm on IV Cardizem.  CT angiogram of the chest obtained on 04/25/2019 showed a 3.4 x 2.4 cm left upper lobe nodule concerning for a focus of neoplasm, ascending thoracic aorta measuring at 4.3 x 4.2 cm, coronary artery calcification.  He was last seen for post hospital follow-up by Dr. Claiborne Billings on 05/07/2019.  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  Myoview obtained on 05/28/2019 showed EF 62%, normal perfusion.  I  last saw the patient on 07/12/2019, he complained of 5-day onset of dizziness, chest discomfort and palpitation.  EKG shows he went back into atrial fibrillation with RVR, initial heart rate was in the 140s.  However according to the daughter, instead of taking metoprolol twice a day, he was only taking metoprolol daily.  We discussed various options, I eventually increase his metoprolol to 25 mg twice daily.   He went to the hospital on Sunday was chest discomfort and right shoulder pain.  He left the ED before evaluation could be completed.  Lab work shows slightly worsening renal function and normal troponin.  He presents today for cardiology follow-up.  For the past 3 days, the right shoulder pain has been persistent.  It is not worse with arm movement.  EKG shows persistent atrial fibrillation however no obvious ST changes.  I recommended proceeding with outpatient cardioversion.  This has been discussed with Dr. Claiborne Billings as well.  I plan to see the patient back in 2 to 3 weeks.  If he is still symptomatic with chest pain afterward, then I will proceed with additional work-up.  Note patient had a normal Myoview in March.  Past Medical History:  Diagnosis Date  . A-fib ([place])   . Allergy    seasonal allergies  . AML (acute myeloid leukemia) ([place]) 04/24/2013  . CAD (coronary artery disease)    last cath 2008 with noncritical CAD  . GERD (gastroesophageal reflux disease)   . Headache 12/17/2012  . Hyperlipidemia   . Leukemia ([place])   . Other  pancytopenia ([place]) 04/09/2013  . Small cell lung cancer ([place]) 12/2010  . Smoking   . Unspecified deficiency anemia 04/16/2013    Past Surgical History:  Procedure Laterality Date  . CARDIAC CATHETERIZATION  01/12/2007   noncritical CAD, no change from cath of 05/20/2005. 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  Marland Kitchen NM MYOCAR PERF WALL MOTION  01/23/2007   mild perfusion defect seen in the basal inferior and mid  inferior regions - c/w attenuation defect. No ischemia present.    Current Medications: Current Meds  Medication Sig  . metoprolol tartrate (LOPRESSOR) 25 MG tablet Take 1 tablet (25 mg total) by mouth 2 (two) times daily.  . nitroGLYCERIN (NITROSTAT) 0.4 MG SL tablet Place 1 tablet (0.4 mg total) under the tongue every 5 (five) minutes as needed for chest pain.  . rivaroxaban (XARELTO) 20 MG TABS tablet Take 1 tablet (20 mg total) by mouth daily with supper.  . simvastatin (ZOCOR) 40 MG tablet Take 1 tablet (40 mg total) by mouth at bedtime.  Marland Kitchen VITAMIN D PO Take 5,000 Units by mouth 3 (three) times a week.      Allergies:   Patient has no known allergies.   Social History   Socioeconomic History  . Marital status: Married    Spouse name: Not on file  . Number of children: Not on file  . Years of education: Not on file  . Highest education level: Not on file  Occupational History  . Not on file  Tobacco Use  . Smoking status: Former Smoker    Packs/day: 1.00    Years: 50.00    Pack years: 50.00    Types: Cigarettes    Quit date: 03/28/2006    Years since quitting: 13.3  . Smokeless tobacco: Never Used  Substance and Sexual Activity  . Alcohol use: No  . Drug use: No  . Sexual activity: Not on file  Other Topics Concern  . Not on file  Social History Narrative  . Not on file   Social Determinants of Health   Financial Resource Strain:   . Difficulty of Paying Living Expenses:   Food Insecurity:   . Worried About Charity fundraiser in the Last Year:   . Arboriculturist in the Last Year:   Transportation Needs:   . Film/video editor (Medical):   Marland Kitchen Lack of Transportation (Non-Medical):   Physical Activity:   . Days of Exercise per Week:   . Minutes of Exercise per Session:   Stress:   . Feeling of Stress :   Social Connections:   . Frequency of Communication with Friends and Family:   . Frequency of Social Gatherings with Friends and Family:   . Attends  Religious Services:   . Active Member of Clubs or Organizations:   . Attends Archivist Meetings:   Marland Kitchen Marital Status:      Family History: The patient's family history includes Healthy in his mother and son; Thyroid cancer in his sister.  ROS:   Please see the history of present illness.     All other systems reviewed and are negative.  EKGs/Labs/Other Studies Reviewed:    The following studies were reviewed today:  Echo 05/28/2019 1. Left ventricular ejection fraction, by estimation, is 65 to 70%. The  left ventricle has normal function. The left ventricle has no regional  wall motion abnormalities. Left ventricular diastolic parameters are  consistent with Grade I  diastolic  dysfunction (impaired relaxation).  2. Right ventricular systolic function is normal. The right ventricular  size is normal. There is mildly elevated pulmonary artery systolic  pressure.  3. The mitral valve is abnormal. Trivial mitral valve regurgitation.  4. The aortic valve is tricuspid. Aortic valve regurgitation is mild to  moderate. Mild aortic valve sclerosis is present, with no evidence of  aortic valve stenosis.  5. Aortic dilatation noted. There is mild dilatation of the ascending  aorta measuring 40 mm.  6. The inferior vena cava is normal in size with greater than 50%  respiratory variability, suggesting right atrial pressure of 3 mmHg.    Myoview 05/28/2019  Nuclear stress EF: 62%.  The left ventricular ejection fraction is normal (55-65%).  There was no ST segment deviation noted during stress.  The study is normal.  This is a low risk study   EKG:  EKG is ordered today.  The ekg ordered today demonstrates atrial fibrillation, heart rate 94.  Recent Labs: 07/17/2019: BUN 16; Creatinine, Ser 1.12; Hemoglobin 13.7; Platelets 157; Potassium 4.3; Sodium 140  Recent Lipid Panel    Component Value Date/Time   CHOL  09/11/2009 0445    121        ATP III CLASSIFICATION:   <200     mg/dL   Desirable  200-239  mg/dL   Borderline High  >=240    mg/dL   High          TRIG 29 09/11/2009 0445   HDL 41 09/11/2009 0445   CHOLHDL 3.0 09/11/2009 0445   VLDL 6 09/11/2009 0445   LDLCALC  09/11/2009 0445    74        Total Cholesterol/HDL:CHD Risk Coronary Heart Disease Risk Table                     Men   Women  1/2 Average Risk   3.4   3.3  Average Risk       5.0   4.4  2 X Average Risk   9.6   7.1  3 X Average Risk  23.4   11.0        Use the calculated Patient Ratio above and the CHD Risk Table to determine the patient's CHD Risk.        ATP III CLASSIFICATION (LDL):  <100     mg/dL   Optimal  100-129  mg/dL   Near or Above                    Optimal  130-159  mg/dL   Borderline  160-189  mg/dL   High  >190     mg/dL   Very High    Physical Exam:    VS:  BP 100/68   Pulse 94   Temp 98.1 F (36.7 C) Comment: Forehead  Ht 5\' 8"  (1.727 m)   Wt 155 lb 3.2 oz (70.4 kg)   SpO2 99%   BMI 23.60 kg/m     Wt Readings from Last 3 Encounters:  07/18/19 155 lb (70.3 kg)  07/16/19 155 lb 3.2 oz (70.4 kg)  07/12/19 154 lb 9.6 oz (70.1 kg)     GEN:  Well nourished, well developed in no acute distress HEENT: Normal NECK: No JVD; No carotid bruits LYMPHATICS: No lymphadenopathy CARDIAC: RRR, no murmurs, rubs, gallops RESPIRATORY:  Clear to auscultation without rales, wheezing or rhonchi  ABDOMEN: Soft, non-tender, non-distended MUSCULOSKELETAL:  No edema; No  deformity  SKIN: Warm and dry NEUROLOGIC:  Alert and oriented x 3 PSYCHIATRIC:  Normal affect   ASSESSMENT:    1. Atrial fibrillation with RVR ([place])   2. Pre-procedural cardiovascular examination    PLAN:    In order of problems listed above:  1. Persistent atrial fibrillation: Heart rate improved after increasing metoprolol to 25 mg twice daily.  However patient continued to be symptomatic with occasional chest pain.  Myoview in March 2021 was normal.  After discussing various  options, we will proceed with outpatient cardioversion.  Case has been discussed with Dr. Claiborne Billings who was agreeable.  -Risk and benefit of electrical cardioversion has been discussed with the patient including but not limited to hypotension, bradycardia, aspiration event.  He was agreeable to proceed.  2. Nonobstructive CAD: Previous cardiac catheterization in 2008 showed mild nonobstructive disease.  Recent Myoview was negative.  3. History of small cell carcinoma completed chemotherapy   Medication Adjustments/Labs and Tests Ordered: Current medicines are reviewed at length with the patient today.  Concerns regarding medicines are outlined above.  Orders Placed This Encounter  Procedures  . Basic metabolic panel  . CBC  . EKG 12-Lead   No orders of the defined types were placed in this encounter.   Patient Instructions  Medication Instructions:  Your physician recommends that you continue on your current medications as directed. Please refer to the Current Medication list given to you today.  *If you need a refill on your cardiac medications before your next appointment, please call your pharmacy*  Lab Work: Your physician recommends that you return for lab work in Today or tomrrow:   BMET  CBC  If you have labs (blood work) drawn today and your tests are completely normal, you will receive your results only by: Marland Kitchen MyChart Message (if you have MyChart) OR . A paper copy in the mail If you have any lab test that is abnormal or we need to change your treatment, we will call you to review the results.  Testing/Procedures: Your physician has recommended that you have a Cardioversion (DCCV). Electrical Cardioversion uses a jolt of electricity to your heart either through paddles or wired patches attached to your chest. This is a controlled, usually prescheduled, procedure. Defibrillation is done under light anesthesia in the hospital, and you usually go home the day of the procedure.  This is done to get your heart back into a normal rhythm. You are not awake for the procedure. Please see the instruction sheet given to you today.  Follow-Up: At [facility], you and your health needs are our priority.  As part of our continuing mission to provide you with exceptional heart care, we have created designated Provider Care Teams.  These Care Teams include your primary Cardiologist (physician) and Advanced Practice Providers (APPs -  Physician Assistants and Nurse Practitioners) who all work together to provide you with the care you need, when you need it.  Your next appointment:   1-2 week(s)  The format for your next appointment:   In Person  Provider:   Shelva Majestic, MD or Almyra Deforest, [name]  Other Instructions   Dear Earney Navy,  You are scheduled for a Cardioversion on Monday 07/22/2019 with Dr. Marlou Porch.  Please arrive at the [facility] (Main Entrance A) at [facility]: [address] [place], [place] [address] at 10:30 AM. (1 hour prior to procedure unless lab work is needed; if lab work is needed arrive 1.5 hours ahead)  DIET: Nothing to eat or drink after midnight except a sip of water with medications (see medication instructions below)  Medication Instructions:  Continue your anticoagulant: Xarelto You will need to continue your anticoagulant after your procedure until you  are told by your  Provider that it is safe to stop   Labs: If patient is on Coumadin, patient needs pt/INR, CBC, BMET within 3 days (No pt/INR needed for patients taking Xarelto, Eliquis, Pradaxa) For patients receiving anesthesia for TEE and all Cardioversion patients: BMET, CBC within 1 week  Come to: [place] to the lab at [facility] [address] between the hours of 8:00 am and 4:30 pm. You do not have to be fasting.  You must have a responsible person to drive you home and stay in the waiting area during your procedure. Failure to do so could  result in cancellation.  Bring your insurance cards.  *Special Note: Every effort is made to have your procedure done on time. Occasionally there are emergencies that occur at the hospital that may cause delays. Please be patient if a delay does occur.       Hilbert Corrigan, [place]  07/18/2019 11:41 PM    [facility]

## 2019-07-16 NOTE — Patient Instructions (Addendum)
Medication Instructions:  Your physician recommends that you continue on your current medications as directed. Please refer to the Current Medication list given to you today.  *If you need a refill on your cardiac medications before your next appointment, please call your pharmacy*  Lab Work: Your physician recommends that you return for lab work in Today or tomrrow:   BMET  CBC  If you have labs (blood work) drawn today and your tests are completely normal, you will receive your results only by: Marland Kitchen MyChart Message (if you have MyChart) OR . A paper copy in the mail If you have any lab test that is abnormal or we need to change your treatment, we will call you to review the results.  Testing/Procedures: Your physician has recommended that you have a Cardioversion (DCCV). Electrical Cardioversion uses a jolt of electricity to your heart either through paddles or wired patches attached to your chest. This is a controlled, usually prescheduled, procedure. Defibrillation is done under light anesthesia in the hospital, and you usually go home the day of the procedure. This is done to get your heart back into a normal rhythm. You are not awake for the procedure. Please see the instruction sheet given to you today.  Follow-Up: At [facility], you and your health needs are our priority.  As part of our continuing mission to provide you with exceptional heart care, we have created designated Provider Care Teams.  These Care Teams include your primary Cardiologist (physician) and Advanced Practice Providers (APPs -  Physician Assistants and Nurse Practitioners) who all work together to provide you with the care you need, when you need it.  Your next appointment:   1-2 week(s)  The format for your next appointment:   In Person  Provider:   Shelva Majestic, MD or Almyra Deforest, [name]  Other Instructions   Dear Gerald [name],  You are scheduled for a Cardioversion on Monday 07/22/2019 with Dr. Marlou Porch.   Please arrive at the [facility] (Main Entrance A) at [facility]: [address] [place], [place] [address] at 10:30 AM. (1 hour prior to procedure unless lab work is needed; if lab work is needed arrive 1.5 hours ahead)  DIET: Nothing to eat or drink after midnight except a sip of water with medications (see medication instructions below)  Medication Instructions:  Continue your anticoagulant: Xarelto You will need to continue your anticoagulant after your procedure until you  are told by your  Provider that it is safe to stop   Labs: If patient is on Coumadin, patient needs pt/INR, CBC, BMET within 3 days (No pt/INR needed for patients taking Xarelto, Eliquis, Pradaxa) For patients receiving anesthesia for TEE and all Cardioversion patients: BMET, CBC within 1 week  Come to: [place] to the lab at [facility] [address] between the hours of 8:00 am and 4:30 pm. You do not have to be fasting.  You must have a responsible person to drive you home and stay in the waiting area during your procedure. Failure to do so could result in cancellation.  Bring your insurance cards.  *Special Note: Every effort is made to have your procedure done on time. Occasionally there are emergencies that occur at the hospital that may cause delays. Please be patient if a delay does occur.

## 2019-07-16 NOTE — H&P (View-Only) (Signed)
Cardiology Office Note:    Date:  07/18/2019   ID:  Gerald [name] 15-May-1940, MRN [id number]  PCP:  Antony Contras, MD  Cardiologist:  Shelva Majestic, MD  Electrophysiologist:  None   Referring MD: Antony Contras, MD   Chief Complaint  Patient presents with  . Follow-up    History of Present Illness:    Gerald [name] is a 79 y.o. 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 He underwent TEE guided cardioversion in 2011. He was treated with Coumadin therapy. He developed recurrent atrial fibrillation in November 2012 and has since been switched to Xarelto therapy. More remotely, it appears he had a cardiac catheterization on 05/20/2005 and again on 01/12/2007. In 2007, he did not have significant coronary artery disease, EF was 60%.Cardiac catheterization in 2008 did show nonobstructive CAD with eccentric 30-40% narrowing in mid RCA, 40% proximal LAD lesion, EF 60%.  Myoview August 2019 showed normal perfusion, EF 64%.  More recently, patient has been diagnosed with acute myeloid leukemia and found to have additional lung nodules.  He underwent radiation therapy.  Patient had recurrent atrial fibrillation with RVR in January 2021 and was seen in the ED.  He converted to sinus rhythm on IV Cardizem.  CT angiogram of the chest obtained on 04/25/2019 showed a 3.4 x 2.4 cm left upper lobe nodule concerning for a focus of neoplasm, ascending thoracic aorta measuring at 4.3 x 4.2 cm, coronary artery calcification.  He was last seen for post hospital follow-up by Dr. Claiborne Billings on 05/07/2019.  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  Myoview obtained on 05/28/2019 showed EF 62%, normal perfusion.  I  last saw the patient on 07/12/2019, he complained of 5-day onset of dizziness, chest discomfort and palpitation.  EKG shows he went back into atrial fibrillation with RVR, initial heart rate was in the 140s.  However according to the daughter, instead of taking metoprolol twice a day, he was only taking metoprolol daily.  We discussed various options, I eventually increase his metoprolol to 25 mg twice daily.   He went to the hospital on Sunday was chest discomfort and right shoulder pain.  He left the ED before evaluation could be completed.  Lab work shows slightly worsening renal function and normal troponin.  He presents today for cardiology follow-up.  For the past 3 days, the right shoulder pain has been persistent.  It is not worse with arm movement.  EKG shows persistent atrial fibrillation however no obvious ST changes.  I recommended proceeding with outpatient cardioversion.  This has been discussed with Dr. Claiborne Billings as well.  I plan to see the patient back in 2 to 3 weeks.  If he is still symptomatic with chest pain afterward, then I will proceed with additional work-up.  Note patient had a normal Myoview in March.  Past Medical History:  Diagnosis Date  . A-fib ([place])   . Allergy    seasonal allergies  . AML (acute myeloid leukemia) ([place]) 04/24/2013  . CAD (coronary artery disease)    last cath 2008 with noncritical CAD  . GERD (gastroesophageal reflux disease)   . Headache 12/17/2012  . Hyperlipidemia   . Leukemia ([place])   . Other  pancytopenia ([place]) 04/09/2013  . Small cell lung cancer ([place]) 12/2010  . Smoking   . Unspecified deficiency anemia 04/16/2013    Past Surgical History:  Procedure Laterality Date  . CARDIAC CATHETERIZATION  01/12/2007   noncritical CAD, no change from cath of 05/20/2005. 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  Marland Kitchen NM MYOCAR PERF WALL MOTION  01/23/2007   mild perfusion defect seen in the basal inferior and mid  inferior regions - c/w attenuation defect. No ischemia present.    Current Medications: Current Meds  Medication Sig  . metoprolol tartrate (LOPRESSOR) 25 MG tablet Take 1 tablet (25 mg total) by mouth 2 (two) times daily.  . nitroGLYCERIN (NITROSTAT) 0.4 MG SL tablet Place 1 tablet (0.4 mg total) under the tongue every 5 (five) minutes as needed for chest pain.  . rivaroxaban (XARELTO) 20 MG TABS tablet Take 1 tablet (20 mg total) by mouth daily with supper.  . simvastatin (ZOCOR) 40 MG tablet Take 1 tablet (40 mg total) by mouth at bedtime.  Marland Kitchen VITAMIN D PO Take 5,000 Units by mouth 3 (three) times a week.      Allergies:   Patient has no known allergies.   Social History   Socioeconomic History  . Marital status: Married    Spouse name: Not on file  . Number of children: Not on file  . Years of education: Not on file  . Highest education level: Not on file  Occupational History  . Not on file  Tobacco Use  . Smoking status: Former Smoker    Packs/day: 1.00    Years: 50.00    Pack years: 50.00    Types: Cigarettes    Quit date: 03/28/2006    Years since quitting: 13.3  . Smokeless tobacco: Never Used  Substance and Sexual Activity  . Alcohol use: No  . Drug use: No  . Sexual activity: Not on file  Other Topics Concern  . Not on file  Social History Narrative  . Not on file   Social Determinants of Health   Financial Resource Strain:   . Difficulty of Paying Living Expenses:   Food Insecurity:   . Worried About Charity fundraiser in the Last Year:   . Arboriculturist in the Last Year:   Transportation Needs:   . Film/video editor (Medical):   Marland Kitchen Lack of Transportation (Non-Medical):   Physical Activity:   . Days of Exercise per Week:   . Minutes of Exercise per Session:   Stress:   . Feeling of Stress :   Social Connections:   . Frequency of Communication with Friends and Family:   . Frequency of Social Gatherings with Friends and Family:   . Attends  Religious Services:   . Active Member of Clubs or Organizations:   . Attends Archivist Meetings:   Marland Kitchen Marital Status:      Family History: The patient's family history includes Healthy in his mother and son; Thyroid cancer in his sister.  ROS:   Please see the history of present illness.     All other systems reviewed and are negative.  EKGs/Labs/Other Studies Reviewed:    The following studies were reviewed today:  Echo 05/28/2019 1. Left ventricular ejection fraction, by estimation, is 65 to 70%. The  left ventricle has normal function. The left ventricle has no regional  wall motion abnormalities. Left ventricular diastolic parameters are  consistent with Grade I  diastolic  dysfunction (impaired relaxation).  2. Right ventricular systolic function is normal. The right ventricular  size is normal. There is mildly elevated pulmonary artery systolic  pressure.  3. The mitral valve is abnormal. Trivial mitral valve regurgitation.  4. The aortic valve is tricuspid. Aortic valve regurgitation is mild to  moderate. Mild aortic valve sclerosis is present, with no evidence of  aortic valve stenosis.  5. Aortic dilatation noted. There is mild dilatation of the ascending  aorta measuring 40 mm.  6. The inferior vena cava is normal in size with greater than 50%  respiratory variability, suggesting right atrial pressure of 3 mmHg.    Myoview 05/28/2019  Nuclear stress EF: 62%.  The left ventricular ejection fraction is normal (55-65%).  There was no ST segment deviation noted during stress.  The study is normal.  This is a low risk study   EKG:  EKG is ordered today.  The ekg ordered today demonstrates atrial fibrillation, heart rate 94.  Recent Labs: 07/17/2019: BUN 16; Creatinine, Ser 1.12; Hemoglobin 13.7; Platelets 157; Potassium 4.3; Sodium 140  Recent Lipid Panel    Component Value Date/Time   CHOL  09/11/2009 0445    121        ATP III  CLASSIFICATION:  <200     mg/dL   Desirable  200-239  mg/dL   Borderline High  >=240    mg/dL   High          TRIG 29 09/11/2009 0445   HDL 41 09/11/2009 0445   CHOLHDL 3.0 09/11/2009 0445   VLDL 6 09/11/2009 0445   LDLCALC  09/11/2009 0445    74        Total Cholesterol/HDL:CHD Risk Coronary Heart Disease Risk Table                     Men   Women  1/2 Average Risk   3.4   3.3  Average Risk       5.0   4.4  2 X Average Risk   9.6   7.1  3 X Average Risk  23.4   11.0        Use the calculated Patient Ratio above and the CHD Risk Table to determine the patient's CHD Risk.        ATP III CLASSIFICATION (LDL):  <100     mg/dL   Optimal  100-129  mg/dL   Near or Above                    Optimal  130-159  mg/dL   Borderline  160-189  mg/dL   High  >190     mg/dL   Very High    Physical Exam:    VS:  BP 100/68   Pulse 94   Temp 98.1 F (36.7 C) Comment: Forehead  Ht 5\' 8"  (1.727 m)   Wt 155 lb 3.2 oz (70.4 kg)   SpO2 99%   BMI 23.60 kg/m     Wt Readings from Last 3 Encounters:  07/18/19 155 lb (70.3 kg)  07/16/19 155 lb 3.2 oz (70.4 kg)  07/12/19 154 lb 9.6 oz (70.1 kg)     GEN:  Well nourished, well developed in no acute distress HEENT: Normal NECK: No JVD; No carotid bruits LYMPHATICS: No lymphadenopathy CARDIAC: RRR, no murmurs, rubs, gallops RESPIRATORY:  Clear to auscultation without rales, wheezing or rhonchi  ABDOMEN: Soft, non-tender, non-distended MUSCULOSKELETAL:  No edema; No  deformity  SKIN: Warm and dry NEUROLOGIC:  Alert and oriented x 3 PSYCHIATRIC:  Normal affect   ASSESSMENT:    1. Atrial fibrillation with RVR ([place])   2. Pre-procedural cardiovascular examination    PLAN:    In order of problems listed above:  1. Persistent atrial fibrillation: Heart rate improved after increasing metoprolol to 25 mg twice daily.  However patient continued to be symptomatic with occasional chest pain.  Myoview in March 2021 was normal.  After discussing  various options, we will proceed with outpatient cardioversion.  Case has been discussed with Dr. Claiborne Billings who was agreeable.  -Risk and benefit of electrical cardioversion has been discussed with the patient including but not limited to hypotension, bradycardia, aspiration event.  He was agreeable to proceed.  2. Nonobstructive CAD: Previous cardiac catheterization in 2008 showed mild nonobstructive disease.  Recent Myoview was negative.  3. History of small cell carcinoma completed chemotherapy   Medication Adjustments/Labs and Tests Ordered: Current medicines are reviewed at length with the patient today.  Concerns regarding medicines are outlined above.  Orders Placed This Encounter  Procedures  . Basic metabolic panel  . CBC  . EKG 12-Lead   No orders of the defined types were placed in this encounter.   Patient Instructions  Medication Instructions:  Your physician recommends that you continue on your current medications as directed. Please refer to the Current Medication list given to you today.  *If you need a refill on your cardiac medications before your next appointment, please call your pharmacy*  Lab Work: Your physician recommends that you return for lab work in Today or tomrrow:   BMET  CBC  If you have labs (blood work) drawn today and your tests are completely normal, you will receive your results only by: Marland Kitchen MyChart Message (if you have MyChart) OR . A paper copy in the mail If you have any lab test that is abnormal or we need to change your treatment, we will call you to review the results.  Testing/Procedures: Your physician has recommended that you have a Cardioversion (DCCV). Electrical Cardioversion uses a jolt of electricity to your heart either through paddles or wired patches attached to your chest. This is a controlled, usually prescheduled, procedure. Defibrillation is done under light anesthesia in the hospital, and you usually go home the day of the  procedure. This is done to get your heart back into a normal rhythm. You are not awake for the procedure. Please see the instruction sheet given to you today.  Follow-Up: At [facility], you and your health needs are our priority.  As part of our continuing mission to provide you with exceptional heart care, we have created designated Provider Care Teams.  These Care Teams include your primary Cardiologist (physician) and Advanced Practice Providers (APPs -  Physician Assistants and Nurse Practitioners) who all work together to provide you with the care you need, when you need it.  Your next appointment:   1-2 week(s)  The format for your next appointment:   In Person  Provider:   Shelva Majestic, MD or Almyra Deforest, [name]  Other Instructions   Dear Earney Navy,  You are scheduled for a Cardioversion on Monday 07/22/2019 with Dr. Marlou Porch.  Please arrive at the [facility] (Main Entrance A) at [facility]: [address] [place], [place] [address] at 10:30 AM. (1 hour prior to procedure unless lab work is needed; if lab work is needed arrive 1.5 hours ahead)  DIET: Nothing to eat or drink after midnight except a sip of water with medications (see medication instructions below)  Medication Instructions:  Continue your anticoagulant: Xarelto You will need to continue your anticoagulant after your procedure until you  are told by your  Provider that it is safe to stop   Labs: If patient is on Coumadin, patient needs pt/INR, CBC, BMET within 3 days (No pt/INR needed for patients taking Xarelto, Eliquis, Pradaxa) For patients receiving anesthesia for TEE and all Cardioversion patients: BMET, CBC within 1 week  Come to: [place] to the lab at [facility] [address] between the hours of 8:00 am and 4:30 pm. You do not have to be fasting.  You must have a responsible person to drive you home and stay in the waiting area during your procedure. Failure to do so  could result in cancellation.  Bring your insurance cards.  *Special Note: Every effort is made to have your procedure done on time. Occasionally there are emergencies that occur at the hospital that may cause delays. Please be patient if a delay does occur.       Hilbert Corrigan, [place]  07/18/2019 11:41 PM    [facility]

## 2019-07-17 ENCOUNTER — Ambulatory Visit: Payer: Medicare Other | Attending: Internal Medicine

## 2019-07-17 DIAGNOSIS — Z20822 Contact with and (suspected) exposure to covid-19: Secondary | ICD-10-CM

## 2019-07-17 NOTE — Telephone Encounter (Signed)
Dr.Kelly and Isaac Laud discussed in office

## 2019-07-18 ENCOUNTER — Other Ambulatory Visit: Payer: Self-pay

## 2019-07-18 ENCOUNTER — Ambulatory Visit (HOSPITAL_COMMUNITY)
Admission: EM | Admit: 2019-07-18 | Discharge: 2019-07-18 | Disposition: A | Discharge status: Home / Self Care | Payer: Medicare Other | Attending: Family Medicine | Admitting: Family Medicine

## 2019-07-18 ENCOUNTER — Encounter (HOSPITAL_COMMUNITY): Payer: Self-pay

## 2019-07-18 ENCOUNTER — Inpatient Hospital Stay (HOSPITAL_COMMUNITY): Admission: RE | Admit: 2019-07-18 | Payer: Medicare Other | Source: Ambulatory Visit

## 2019-07-18 DIAGNOSIS — M7918 Myalgia, other site: Secondary | ICD-10-CM

## 2019-07-18 LAB — CBC
Hematocrit: 41.2 % (ref 37.5–51.0)
Hemoglobin: 13.7 g/dL (ref 13.0–17.7)
MCH: 34.2 pg — ABNORMAL HIGH (ref 26.6–33.0)
MCHC: 33.3 g/dL (ref 31.5–35.7)
MCV: 103 fL — ABNORMAL HIGH (ref 79–97)
Platelets: 157 10*3/uL (ref 150–450)
RBC: 4.01 x10E6/uL — ABNORMAL LOW (ref 4.14–5.80)
RDW: 13.3 % (ref 11.6–15.4)
WBC: 4.9 10*3/uL (ref 3.4–10.8)

## 2019-07-18 LAB — BASIC METABOLIC PANEL
BUN/Creatinine Ratio: 14 (ref 10–24)
BUN: 16 mg/dL (ref 8–27)
CO2: 18 mmol/L — ABNORMAL LOW (ref 20–29)
Calcium: 8.6 mg/dL (ref 8.6–10.2)
Chloride: 108 mmol/L — ABNORMAL HIGH (ref 96–106)
Creatinine, Ser: 1.12 mg/dL (ref 0.76–1.27)
GFR calc Af Amer: 71 mL/min/{1.73_m2} (ref >59)
GFR calc non Af Amer: 62 mL/min/{1.73_m2} (ref >59)
Glucose: 92 mg/dL (ref 65–99)
Potassium: 4.3 mmol/L (ref 3.5–5.2)
Sodium: 140 mmol/L (ref 134–144)

## 2019-07-18 LAB — SARS-COV-2, NAA 2 DAY TAT

## 2019-07-18 LAB — NOVEL CORONAVIRUS, NAA: SARS-CoV-2, NAA: NOT DETECTED

## 2019-07-18 MED ORDER — PREDNISONE 5 MG PO TABS: ORAL_TABLET | ORAL | 0 refills | Status: DC

## 2019-07-18 NOTE — Progress Notes (Signed)
Stable kidney function, electrolyte and red blood cell count

## 2019-07-18 NOTE — Discharge Instructions (Signed)
Please try heat  Please try the exercises  Please follow up if your symptoms fail to improve.

## 2019-07-18 NOTE — ED Provider Notes (Signed)
Gerald [name]    CSN: [id number] Arrival date & time: 07/18/19  1753      History   Chief Complaint Chief Complaint  Patient presents with  . Shoulder Pain    HPI Gerald [name] is a 79 y.o. male.   He is presenting with right trapezius pain.  He was seen by his cardiologist and they do not appreciate to be cardiac in nature.  The pain is been ongoing for a few days.  Seems to be associated around the superior border of the scapula.  Seems worse in the morning.  He denies any new or different exercises or activities.  Has tried over-the-counter medications with no improvement.  No radicular symptoms.  No shortness of breath.  No nausea.  No history of surgery  HPI  Past Medical History:  Diagnosis Date  . A-fib ([place])   . Allergy    seasonal allergies  . AML (acute myeloid leukemia) ([place]) 04/24/2013  . CAD (coronary artery disease)    last cath 2008 with noncritical CAD  . GERD (gastroesophageal reflux disease)   . Headache 12/17/2012  . Hyperlipidemia   . Leukemia ([place])   . Other pancytopenia ([place]) 04/09/2013  . Small cell lung cancer ([place]) 12/2010  . Smoking   . Unspecified deficiency anemia 04/16/2013    Patient Active Problem List   Diagnosis Date Noted  . Chest pain with moderate risk of acute coronary syndrome 11/08/2017  . CAD (coronary artery disease) 11/08/2017  . Paroxysmal atrial fibrillation ([place]) 04/10/2014  . Sinus bradycardia 04/10/2014  . AML (acute myeloid leukemia) ([place]) 04/24/2013  . Unspecified deficiency anemia 04/16/2013  . Other pancytopenia ([place]) 04/09/2013  . Chronic anticoagulation 01/09/2013  . Headache 12/17/2012  . Hyperlipidemia   . Smoking   . Small cell lung cancer ([place]) 12/27/2010    Past Surgical History:  Procedure Laterality Date  . CARDIAC CATHETERIZATION  01/12/2007   noncritical CAD, no change from cath of 05/20/2005. 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  Marland Kitchen NM MYOCAR  PERF WALL MOTION  01/23/2007   mild perfusion defect seen in the basal inferior and mid inferior regions - c/w attenuation defect. No ischemia present.       Home Medications    Prior to Admission medications   Medication Sig Start Date End Date Taking? Authorizing Provider  metoprolol tartrate (LOPRESSOR) 25 MG tablet Take 1 tablet (25 mg total) by mouth 2 (two) times daily. 05/07/19   Troy Sine, MD  nitroGLYCERIN (NITROSTAT) 0.4 MG SL tablet Place 1 tablet (0.4 mg total) under the tongue every 5 (five) minutes as needed for chest pain. 05/07/19 08/05/19  Troy Sine, MD  predniSONE (DELTASONE) 5 MG tablet Take 6 pills for first day, 5 pills second day, 4 pills third day, 3 pills fourth day, 2 pills the fifth day, and 1 pill sixth day. 07/18/19   Rosemarie Ax, MD  rivaroxaban (XARELTO) 20 MG TABS tablet Take 1 tablet (20 mg total) by mouth daily with supper. 05/07/19   Troy Sine, MD  simvastatin (ZOCOR) 40 MG tablet Take 1 tablet (40 mg total) by mouth at bedtime. 05/07/19 08/05/19  Troy Sine, MD  VITAMIN D PO Take 5,000 Units by mouth 3 (three) times a week.     [provider]    Family History Family History  Problem Relation Age of Onset  . Healthy Mother   . Thyroid cancer Sister   .  Healthy Son     Social History Social History   Tobacco Use  . Smoking status: Former Smoker    Packs/day: 1.00    Years: 50.00    Pack years: 50.00    Types: Cigarettes    Quit date: 03/28/2006    Years since quitting: 13.3  . Smokeless tobacco: Never Used  Substance Use Topics  . Alcohol use: No  . Drug use: No     Allergies   Patient has no known allergies.   Review of Systems Review of Systems  See HPI  Physical Exam Triage Vital Signs ED Triage Vitals  Enc Vitals Group     BP 07/18/19 1846 (!) 127/93     Pulse Rate 07/18/19 1846 71     Resp 07/18/19 1846 16     Temp 07/18/19 1846 97.7 F (36.5 C)     Temp Source 07/18/19 1846 Oral     SpO2  07/18/19 1846 100 %     Weight 07/18/19 1853 155 lb (70.3 kg)     Height 07/18/19 1853 6' (1.829 m)     Head Circumference --      Peak Flow --      Pain Score 07/18/19 1852 9     Pain Loc --      Pain Edu? --      Excl. in [place]? --    No data found.  Updated Vital Signs BP (!) 127/93   Pulse 71   Temp 97.7 F (36.5 C) (Oral)   Resp 16   Ht 6' (1.829 m)   Wt 70.3 kg   SpO2 100%   BMI 21.02 kg/m   Visual Acuity Right Eye Distance:   Left Eye Distance:   Bilateral Distance:    Right Eye Near:   Left Eye Near:    Bilateral Near:     Physical Exam Gen: NAD, alert, cooperative with exam, well-appearing ENT: normal lips, normal nasal mucosa,  Eye: normal EOM, normal conjunctiva and lids CV:  no edema, +2 pedal pulses   Resp: no accessory muscle use, non-labored,  Skin: no rashes, no areas of induration  Neuro: normal tone, normal sensation to touch Psych:  normal insight, alert and oriented MSK:  Right shoulder: Normal range of motion and active flexion abduction. Normal internal and external rotation. Normal empty can. Normal strength resistance. Trigger points appreciated over the superior border of the scapula. Neurovascularly intact   UC Treatments / Results  Labs (all labs ordered are listed, but only abnormal results are displayed) Labs Reviewed - No data to display  EKG   Radiology No results found.  Procedures Procedures (including critical care time)  Medications Ordered in UC Medications - No data to display  Initial Impression / Assessment and Plan / UC Course  I have reviewed the triage vital signs and the nursing notes.  Pertinent labs & imaging results that were available during my care of the patient were reviewed by me and considered in my medical decision making (see chart for details).     [name] is an 79 year old male is presenting with right trapezius and periscapular pain.  Seems more musculoskeletal in nature and myofascial.   Less likely for cardiac or pulmonary in nature.  Provided prednisone.  Counseled on home exercise therapy and supportive care.  Given indications to follow-up and return.  Final Clinical Impressions(s) / UC Diagnoses   Final diagnoses:  Myofascial pain     Discharge Instructions  Please try heat  Please try the exercises  Please follow up if your symptoms fail to improve.     ED Prescriptions    Medication Sig Dispense Auth. Provider   predniSONE (DELTASONE) 5 MG tablet Take 6 pills for first day, 5 pills second day, 4 pills third day, 3 pills fourth day, 2 pills the fifth day, and 1 pill sixth day. 21 tablet Rosemarie Ax, MD     PDMP not reviewed this encounter.   Rosemarie Ax, MD 07/18/19 [phone]

## 2019-07-18 NOTE — ED Triage Notes (Addendum)
Pt c/o 9/10 squeezing pain in right shoulderx1 wk. Pt states the pain is worse when he's just waking up. Pt states the muscle feels tight. Pt has tried OTC remedies such as icy hot, ice, etc to no avail. Pt has full ROM of shoulder and arm. Pt denies injuring shoulder

## 2019-07-19 ENCOUNTER — Other Ambulatory Visit: Payer: Self-pay | Admitting: Physician Assistant

## 2019-07-22 ENCOUNTER — Ambulatory Visit (HOSPITAL_COMMUNITY): Payer: Medicare Other | Admitting: Certified Registered Nurse Anesthetist

## 2019-07-22 ENCOUNTER — Other Ambulatory Visit: Payer: Self-pay

## 2019-07-22 ENCOUNTER — Encounter (HOSPITAL_COMMUNITY): Payer: Self-pay | Admitting: Cardiology

## 2019-07-22 ENCOUNTER — Ambulatory Visit (HOSPITAL_COMMUNITY)
Admission: RE | Admit: 2019-07-22 | Discharge: 2019-07-22 | Disposition: A | Discharge status: Home / Self Care | Payer: Medicare Other | Attending: Cardiology | Admitting: Cardiology

## 2019-07-22 ENCOUNTER — Encounter (HOSPITAL_COMMUNITY)
Admission: RE | Disposition: A | Discharge status: Home / Self Care | Payer: Medicare Other | Source: Home / Self Care | Attending: Cardiology

## 2019-07-22 DIAGNOSIS — I4819 Other persistent atrial fibrillation: Secondary | ICD-10-CM | POA: Insufficient documentation

## 2019-07-22 DIAGNOSIS — I480 Paroxysmal atrial fibrillation: Secondary | ICD-10-CM

## 2019-07-22 DIAGNOSIS — Z87891 Personal history of nicotine dependence: Secondary | ICD-10-CM | POA: Diagnosis not present

## 2019-07-22 DIAGNOSIS — E785 Hyperlipidemia, unspecified: Secondary | ICD-10-CM | POA: Insufficient documentation

## 2019-07-22 DIAGNOSIS — Z856 Personal history of leukemia: Secondary | ICD-10-CM | POA: Diagnosis not present

## 2019-07-22 DIAGNOSIS — Z79899 Other long term (current) drug therapy: Secondary | ICD-10-CM | POA: Diagnosis not present

## 2019-07-22 DIAGNOSIS — I2510 Atherosclerotic heart disease of native coronary artery without angina pectoris: Secondary | ICD-10-CM | POA: Diagnosis not present

## 2019-07-22 DIAGNOSIS — K219 Gastro-esophageal reflux disease without esophagitis: Secondary | ICD-10-CM | POA: Insufficient documentation

## 2019-07-22 DIAGNOSIS — Z7901 Long term (current) use of anticoagulants: Secondary | ICD-10-CM | POA: Diagnosis not present

## 2019-07-22 DIAGNOSIS — Z85118 Personal history of other malignant neoplasm of bronchus and lung: Secondary | ICD-10-CM | POA: Insufficient documentation

## 2019-07-22 DIAGNOSIS — Z923 Personal history of irradiation: Secondary | ICD-10-CM | POA: Insufficient documentation

## 2019-07-22 HISTORY — PX: CARDIOVERSION: SHX1299

## 2019-07-22 SURGERY — CARDIOVERSION
Anesthesia: General

## 2019-07-22 MED ORDER — SODIUM CHLORIDE 0.9 % IV SOLN
INTRAVENOUS | Status: AC | PRN
  Administered 2019-07-22: 500 mL via INTRAVENOUS

## 2019-07-22 MED ORDER — PROPOFOL 10 MG/ML IV BOLUS
INTRAVENOUS | Status: DC | PRN
  Administered 2019-07-22: 40 mg via INTRAVENOUS
  Administered 2019-07-22: 20 mg via INTRAVENOUS

## 2019-07-22 NOTE — Transfer of Care (Signed)
Immediate Anesthesia Transfer of Care Note  Patient: Gerald [name]  Procedure(s) Performed: CARDIOVERSION (N/A )  Patient Location: Endoscopy Unit  Anesthesia Type:General  Level of Consciousness: drowsy  Airway & Oxygen Therapy: Patient Spontanous Breathing and Patient connected to nasal cannula oxygen  Post-op Assessment: Report given to RN and Post -op Vital signs reviewed and stable  Post vital signs: Reviewed and stable  Last Vitals:  Vitals Value Taken Time  BP    Temp    Pulse    Resp    SpO2      Last Pain:  Vitals:   07/22/19 1010  PainSc: 0-No pain         Complications: No apparent anesthesia complications

## 2019-07-22 NOTE — CV Procedure (Signed)
    Electrical Cardioversion Procedure Note Bravlio Luca [id number] [birth year]  Procedure: Electrical Cardioversion Indications:  Atrial Fibrillation  Time Out: Verified patient identification, verified procedure,medications/allergies/relevent history reviewed, required imaging and test results available.  Performed  Procedure Details  The patient was NPO after midnight. Anesthesia was administered at the beside  by Dr.Moser with 60mg  of propofol.  Cardioversion was performed with synchronized biphasic defibrillation via AP pads with 120 joules.  1 attempt(s) were performed.  The patient converted to normal sinus rhythm. The patient tolerated the procedure well   IMPRESSION:  Successful cardioversion of atrial fibrillation    Candee Furbish 07/22/2019, 11:58 AM

## 2019-07-22 NOTE — Anesthesia Postprocedure Evaluation (Signed)
Anesthesia Post Note  Patient: Gerald [name]  Procedure(s) Performed: CARDIOVERSION (N/A )     Patient location during evaluation: Endoscopy Anesthesia Type: General Level of consciousness: awake and patient cooperative Pain management: pain level controlled Vital Signs Assessment: post-procedure vital signs reviewed and stable Respiratory status: spontaneous breathing, nonlabored ventilation, respiratory function stable and patient connected to nasal cannula oxygen Cardiovascular status: blood pressure returned to baseline and stable Postop Assessment: no apparent nausea or vomiting Anesthetic complications: no    Last Vitals:  Vitals:   07/22/19 1205 07/22/19 1215  BP: 123/81 113/80  Pulse: (!) 59 (!) 56  Resp: 17 (!) 21  Temp: (!) 36.3 C   SpO2: 98% 100%    Last Pain:  Vitals:   07/22/19 1215  TempSrc:   PainSc: 0-No pain                 Adilson Grafton

## 2019-07-22 NOTE — Anesthesia Preprocedure Evaluation (Signed)
Anesthesia Evaluation  Patient identified by MRN, date of birth, ID band Patient awake    Reviewed: Allergy & Precautions, NPO status , Patient's Chart, lab work & pertinent test results, reviewed documented beta blocker date and time   History of Anesthesia Complications Negative for: history of anesthetic complications  Airway Mallampati: II  TM Distance: >3 FB Neck ROM: Full    Dental  (+) Dental Advisory Given   Pulmonary neg pulmonary ROS, neg recent URI, former smoker,    breath sounds clear to auscultation       Cardiovascular + CAD  + dysrhythmias Atrial Fibrillation  Rhythm:Irregular  2021:  Nuclear stress EF: 62%.  The left ventricular ejection fraction is normal (55-65%).  There was no ST segment deviation noted during stress.  The study is normal.  This is a low risk study.   Neuro/Psych  Headaches, neg Seizures negative psych ROS   GI/Hepatic Neg liver ROS, GERD  ,  Endo/Other  negative endocrine ROS  Renal/GU negative Renal ROS     Musculoskeletal   Abdominal   Peds  Hematology  (+) Blood dyscrasia, anemia , xarelto   Anesthesia Other Findings   Reproductive/Obstetrics                             Anesthesia Physical Anesthesia Plan  ASA: II  Anesthesia Plan: General   Post-op Pain Management:    Induction: Intravenous  PONV Risk Score and Plan: 2 and Treatment may vary due to age or medical condition  Airway Management Planned: Nasal Cannula  Additional Equipment: None  Intra-op Plan:   Post-operative Plan:   Informed Consent: I have reviewed the patients History and Physical, chart, labs and discussed the procedure including the risks, benefits and alternatives for the proposed anesthesia with the patient or authorized representative who has indicated his/her understanding and acceptance.     Dental advisory given  Plan Discussed with: CRNA and  Surgeon  Anesthesia Plan Comments:         Anesthesia Quick Evaluation

## 2019-07-22 NOTE — Discharge Instructions (Signed)
Electrical Cardioversion Electrical cardioversion is the delivery of a jolt of electricity to restore a normal rhythm to the heart. A rhythm that is too fast or is not regular keeps the heart from pumping well. In this procedure, sticky patches or metal paddles are placed on the chest to deliver electricity to the heart from a device. This procedure may be done in an emergency if:  There is low or no blood pressure as a result of the heart rhythm.  Normal rhythm must be restored as fast as possible to protect the brain and heart from further damage.  It may save a life. This may also be a scheduled procedure for irregular or fast heart rhythms that are not immediately life-threatening. Tell a health care provider about:  Any allergies you have.  All medicines you are taking, including vitamins, herbs, eye drops, creams, and over-the-counter medicines.  Any problems you or family members have had with anesthetic medicines.  Any blood disorders you have.  Any surgeries you have had.  Any medical conditions you have.  Whether you are pregnant or may be pregnant. What are the risks? Generally, this is a safe procedure. However, problems may occur, including:  Allergic reactions to medicines.  A blood clot that breaks free and travels to other parts of your body.  The possible return of an abnormal heart rhythm within hours or days after the procedure.  Your heart stopping (cardiac arrest). This is rare. What happens before the procedure? Medicines  Your health care provider may have you start taking: ? Blood-thinning medicines (anticoagulants) so your blood does not clot as easily. ? Medicines to help stabilize your heart rate and rhythm.  Ask your health care provider about: ? Changing or stopping your regular medicines. This is especially important if you are taking diabetes medicines or blood thinners. ? Taking medicines such as aspirin and ibuprofen. These medicines can  thin your blood. Do not take these medicines unless your health care provider tells you to take them. ? Taking over-the-counter medicines, vitamins, herbs, and supplements. General instructions  Follow instructions from your health care provider about eating or drinking restrictions.  Plan to have someone take you home from the hospital or clinic.  If you will be going home right after the procedure, plan to have someone with you for 24 hours.  Ask your health care provider what steps will be taken to help prevent infection. These may include washing your skin with a germ-killing soap. What happens during the procedure?   An IV will be inserted into one of your veins.  Sticky patches (electrodes) or metal paddles may be placed on your chest.  You will be given a medicine to help you relax (sedative).  An electrical shock will be delivered. The procedure may vary among health care providers and hospitals. What can I expect after the procedure?  Your blood pressure, heart rate, breathing rate, and blood oxygen level will be monitored until you leave the hospital or clinic.  Your heart rhythm will be watched to make sure it does not change.  You may have some redness on the skin where the shocks were given. Follow these instructions at home:  Do not drive for 24 hours if you were given a sedative during your procedure.  Take over-the-counter and prescription medicines only as told by your health care provider.  Ask your health care provider how to check your pulse. Check it often.  Rest for 48 hours after the procedure or  as told by your health care provider.  Avoid or limit your caffeine use as told by your health care provider.  Keep all follow-up visits as told by your health care provider. This is important. Contact a health care provider if:  You feel like your heart is beating too quickly or your pulse is not regular.  You have a serious muscle cramp that does not go  away. Get help right away if:  You have discomfort in your chest.  You are dizzy or you feel faint.  You have trouble breathing or you are short of breath.  Your speech is slurred.  You have trouble moving an arm or leg on one side of your body.  Your fingers or toes turn cold or blue. Summary  Electrical cardioversion is the delivery of a jolt of electricity to restore a normal rhythm to the heart.  This procedure may be done right away in an emergency or may be a scheduled procedure if the condition is not an emergency.  Generally, this is a safe procedure.  After the procedure, check your pulse often as told by your health care provider. This information is not intended to replace advice given to you by your health care provider. Make sure you discuss any questions you have with your health care provider. Document Revised: 10/15/2018 Document Reviewed: 10/15/2018 Elsevier Patient Education  [place] After These instructions provide you with information about caring for yourself after your procedure. Your health care provider may also give you more specific instructions. Your treatment has been planned according to current medical practices, but problems sometimes occur. Call your health care provider if you have any problems or questions after your procedure. What can I expect after the procedure? After your procedure, you may:  Feel sleepy for several hours.  Feel clumsy and have poor balance for several hours.  Feel forgetful about what happened after the procedure.  Have poor judgment for several hours.  Feel nauseous or vomit.  Have a sore throat if you had a breathing tube during the procedure. Follow these instructions at home: For at least 24 hours after the procedure:      Have a responsible adult stay with you. It is important to have someone help care for you until you are awake and alert.  Rest as  needed.  Do not: ? Participate in activities in which you could fall or become injured. ? Drive. ? Use heavy machinery. ? Drink alcohol. ? Take sleeping pills or medicines that cause drowsiness. ? Make important decisions or sign legal documents. ? Take care of children on your own. Eating and drinking  Follow the diet that is recommended by your health care provider.  If you vomit, drink water, juice, or soup when you can drink without vomiting.  Make sure you have little or no nausea before eating solid foods. General instructions  Take over-the-counter and prescription medicines only as told by your health care provider.  If you have sleep apnea, surgery and certain medicines can increase your risk for breathing problems. Follow instructions from your health care provider about wearing your sleep device: ? Anytime you are sleeping, including during daytime naps. ? While taking prescription pain medicines, sleeping medicines, or medicines that make you drowsy.  If you smoke, do not smoke without supervision.  Keep all follow-up visits as told by your health care provider. This is important. Contact a health care provider if:  You  keep feeling nauseous or you keep vomiting.  You feel light-headed.  You develop a rash.  You have a fever. Get help right away if:  You have trouble breathing. Summary  For several hours after your procedure, you may feel sleepy and have poor judgment.  Have a responsible adult stay with you for at least 24 hours or until you are awake and alert. This information is not intended to replace advice given to you by your health care provider. Make sure you discuss any questions you have with your health care provider. Document Revised: 06/12/2017 Document Reviewed: 07/05/2015 Elsevier Patient Education  [place].

## 2019-07-22 NOTE — Interval H&P Note (Signed)
History and Physical Interval Note:  07/22/2019 10:45 AM  Gerald [name]  has presented today for surgery, with the diagnosis of A-FIB.  The various methods of treatment have been discussed with the patient and family. After consideration of risks, benefits and other options for treatment, the patient has consented to  Procedure(s): CARDIOVERSION (N/A) as a surgical intervention.  The patient's history has been reviewed, patient examined, no change in status, stable for surgery.  I have reviewed the patient's chart and labs.  Questions were answered to the patient's satisfaction.     [employer]

## 2019-07-22 NOTE — Anesthesia Procedure Notes (Signed)
Procedure Name: General with mask airway Performed by: Valda Favia, CRNA Pre-anesthesia Checklist: Patient identified, Emergency Drugs available, Suction available, Patient being monitored and Timeout performed Patient Re-evaluated:Patient Re-evaluated prior to induction Oxygen Delivery Method: Ambu bag Preoxygenation: Pre-oxygenation with 100% oxygen Induction Type: IV induction Ventilation: Mask ventilation without difficulty Number of attempts: 1 Placement Confirmation: positive ETCO2 Dental Injury: Teeth and Oropharynx as per pre-operative assessment

## 2019-07-31 ENCOUNTER — Ambulatory Visit (INDEPENDENT_AMBULATORY_CARE_PROVIDER_SITE_OTHER): Payer: Medicare Other | Admitting: Physician Assistant

## 2019-07-31 ENCOUNTER — Other Ambulatory Visit: Payer: Self-pay

## 2019-07-31 VITALS — BP 122/64 | HR 52 | Ht 72.0 in | Wt 155.0 lb

## 2019-07-31 DIAGNOSIS — C3490 Malignant neoplasm of unspecified part of unspecified bronchus or lung: Secondary | ICD-10-CM

## 2019-07-31 DIAGNOSIS — I351 Nonrheumatic aortic (valve) insufficiency: Secondary | ICD-10-CM

## 2019-07-31 DIAGNOSIS — I712 Thoracic aortic aneurysm, without rupture, unspecified: Secondary | ICD-10-CM

## 2019-07-31 DIAGNOSIS — I2510 Atherosclerotic heart disease of native coronary artery without angina pectoris: Secondary | ICD-10-CM | POA: Diagnosis not present

## 2019-07-31 DIAGNOSIS — I4819 Other persistent atrial fibrillation: Secondary | ICD-10-CM

## 2019-07-31 DIAGNOSIS — E785 Hyperlipidemia, unspecified: Secondary | ICD-10-CM

## 2019-07-31 NOTE — Patient Instructions (Addendum)
Medication Instructions:  Your physician recommends that you continue on your current medications as directed. Please refer to the Current Medication list given to you today.  *If you need a refill on your cardiac medications before your next appointment, please call your pharmacy*  Lab Work: NONE ordered at this time of appointment   If you have labs (blood work) drawn today and your tests are completely normal, you will receive your results only by: Marland Kitchen MyChart Message (if you have MyChart) OR . A paper copy in the mail If you have any lab test that is abnormal or we need to change your treatment, we will call you to review the results.  Testing/Procedures: NONE ordered at this time of appointment   Follow-Up: At [facility], you and your health needs are our priority.  As part of our continuing mission to provide you with exceptional heart care, we have created designated Provider Care Teams.  These Care Teams include your primary Cardiologist (physician) and Advanced Practice Providers (APPs -  Physician Assistants and Nurse Practitioners) who all work together to provide you with the care you need, when you need it.  Your next appointment:   3-4 month(s)  The format for your next appointment:   In Person  Provider:   Shelva Majestic, MD  Other Instructions

## 2019-07-31 NOTE — Progress Notes (Signed)
Cardiology Office Note:    Date:  08/02/2019   ID:  Gerald, [name] 1941-03-20, MRN [id number]  PCP:  Antony Contras, MD  Cardiologist:  Shelva Majestic, MD  Electrophysiologist:  None   Referring MD: Antony Contras, MD   Chief Complaint  Patient presents with  . Follow-up    seen for Dr. Claiborne Billings.     History of Present Illness:    Gerald [name] is a 79 y.o. 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 He underwent TEE guided cardioversion in 2011. He was treated with Coumadin therapy. He developed recurrent atrial fibrillation in November 2012 and has since been switched to Xarelto therapy. More remotely, it appears he had a cardiac catheterization on 05/20/2005 and again on 01/12/2007. In 2007, he did not have significant coronary artery disease, EF was 60%.Cardiac catheterization in 2008 did show nonobstructive CAD with eccentric 30-40% narrowing in mid RCA, 40% proximal LAD lesion, EF 60%.Myoview August 2019 showed normal perfusion, EF 64%. More recently, patient has been diagnosed with acute myeloid leukemia and found to have additional lung nodules. He underwent radiation therapy. Patient had recurrent atrial fibrillation with RVR in January 2021 and was seen in the ED. He converted to sinus rhythm on IV Cardizem. CT angiogram of the chest obtained on 04/25/2019 showed a 3.4 x 2.4 cm left upper lobe nodule concerning for a focus of neoplasm, ascending thoracic aorta measuring at 4.3 x 4.2 cm, coronary artery calcification. He was last seen for post hospital follow-up by Dr. Claiborne Billings on 05/07/2019. 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 Myoview obtained on 05/28/2019 showed EF 62%,  normal perfusion.  I saw the patient in April 2021 at which time she complained of 5-day onset of dizziness chest discomfort and palpitation.  EKG shows that he has went back into atrial fibrillation with RVR with initial heart rate in the 140s.  I attempted to increase his metoprolol to 25 mg twice daily from the previous 25 mg daily, however he failed to convert by himself.  He went to the ED for right shoulder pain, troponin was negative.  He eventually underwent successful cardioversion on 07/22/2019 by Dr. Marlou Porch.  Patient presents today along with his son who acted as the Optometrist.  After cardioversion, pretty much all of his symptom has completely resolved.  He denies any dizziness, shortness of breath, chest pain or shoulder pain.  He is feeling well again.  I recommended continue on the current therapy and follow-up with Dr. Claiborne Billings in 3 to 4 months.  Otherwise he does not have any heart failure symptoms on physical exam.  Past Medical History:  Diagnosis Date  . A-fib ([place])   . Allergy    seasonal allergies  . AML (acute myeloid leukemia) ([place]) 04/24/2013  . CAD (coronary artery disease)    last cath 2008 with noncritical CAD  . GERD (gastroesophageal reflux disease)   . Headache 12/17/2012  . Hyperlipidemia   . Leukemia ([place])   . Other pancytopenia ([place]) 04/09/2013  . Small cell lung cancer ([place]) 12/2010  . Smoking   . Unspecified deficiency anemia 04/16/2013    Past Surgical History:  Procedure Laterality Date  . CARDIAC CATHETERIZATION  01/12/2007  noncritical CAD, no change from cath of 05/20/2005. 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  Marland Kitchen CARDIOVERSION N/A 07/22/2019   Procedure: CARDIOVERSION;  Surgeon: Jerline Pain, MD;  Location: [facility] ENDOSCOPY;  Service: Cardiovascular;  Laterality: N/A;  . NM [place]  01/23/2007   mild perfusion defect seen in the basal inferior and mid inferior regions - c/w attenuation  defect. No ischemia present.    Current Medications: Current Meds  Medication Sig  . metoprolol tartrate (LOPRESSOR) 25 MG tablet Take 1 tablet (25 mg total) by mouth 2 (two) times daily.  . nitroGLYCERIN (NITROSTAT) 0.4 MG SL tablet Place 1 tablet (0.4 mg total) under the tongue every 5 (five) minutes as needed for chest pain.  . predniSONE (DELTASONE) 5 MG tablet Take 6 pills for first day, 5 pills second day, 4 pills third day, 3 pills fourth day, 2 pills the fifth day, and 1 pill sixth day.  . rivaroxaban (XARELTO) 20 MG TABS tablet Take 1 tablet (20 mg total) by mouth daily with supper.  . simvastatin (ZOCOR) 40 MG tablet Take 1 tablet (40 mg total) by mouth at bedtime.  Marland Kitchen VITAMIN D PO Take 5,000 Units by mouth 3 (three) times a week.      Allergies:   Patient has no known allergies.   Social History   Socioeconomic History  . Marital status: Married    Spouse name: Not on file  . Number of children: Not on file  . Years of education: Not on file  . Highest education level: Not on file  Occupational History  . Not on file  Tobacco Use  . Smoking status: Former Smoker    Packs/day: 1.00    Years: 50.00    Pack years: 50.00    Types: Cigarettes    Quit date: 03/28/2006    Years since quitting: 13.3  . Smokeless tobacco: Never Used  Substance and Sexual Activity  . Alcohol use: No  . Drug use: No  . Sexual activity: Not on file  Other Topics Concern  . Not on file  Social History Narrative  . Not on file   Social Determinants of Health   Financial Resource Strain:   . Difficulty of Paying Living Expenses:   Food Insecurity:   . Worried About Charity fundraiser in the Last Year:   . Arboriculturist in the Last Year:   Transportation Needs:   . Film/video editor (Medical):   Marland Kitchen Lack of Transportation (Non-Medical):   Physical Activity:   . Days of Exercise per Week:   . Minutes of Exercise per Session:   Stress:   . Feeling of Stress :   Social  Connections:   . Frequency of Communication with Friends and Family:   . Frequency of Social Gatherings with Friends and Family:   . Attends Religious Services:   . Active Member of Clubs or Organizations:   . Attends Archivist Meetings:   Marland Kitchen Marital Status:      Family History: The patient's family history includes Healthy in his mother and son; Thyroid cancer in his sister.  ROS:   Please see the history of present illness.     All other systems reviewed and are negative.  EKGs/Labs/Other Studies Reviewed:    The following studies were reviewed today:  Echo 05/28/2019 1. Left ventricular ejection fraction, by estimation, is 65 to 70%. The  left ventricle has normal  function. The left ventricle has no regional  wall motion abnormalities. Left ventricular diastolic parameters are  consistent with Grade I diastolic  dysfunction (impaired relaxation).  2. Right ventricular systolic function is normal. The right ventricular  size is normal. There is mildly elevated pulmonary artery systolic  pressure.  3. The mitral valve is abnormal. Trivial mitral valve regurgitation.  4. The aortic valve is tricuspid. Aortic valve regurgitation is mild to  moderate. Mild aortic valve sclerosis is present, with no evidence of  aortic valve stenosis.  5. Aortic dilatation noted. There is mild dilatation of the ascending  aorta measuring 40 mm.  6. The inferior vena cava is normal in size with greater than 50%  respiratory variability, suggesting right atrial pressure of 3 mmHg.    Myoview 05/28/2019  Nuclear stress EF: 62%.  The left ventricular ejection fraction is normal (55-65%).  There was no ST segment deviation noted during stress.  The study is normal.  This is a low risk study.  EKG:  EKG is ordered today.  The ekg ordered today demonstrates sinus bradycardia, heart rate 52, no significant ST-T wave changes.  Recent Labs: 07/17/2019: BUN 16; Creatinine, Ser 1.12;  Hemoglobin 13.7; Platelets 157; Potassium 4.3; Sodium 140  Recent Lipid Panel    Component Value Date/Time   CHOL  09/11/2009 0445    121        ATP III CLASSIFICATION:  <200     mg/dL   Desirable  200-239  mg/dL   Borderline High  >=240    mg/dL   High          TRIG 29 09/11/2009 0445   HDL 41 09/11/2009 0445   CHOLHDL 3.0 09/11/2009 0445   VLDL 6 09/11/2009 0445   LDLCALC  09/11/2009 0445    74        Total Cholesterol/HDL:CHD Risk Coronary Heart Disease Risk Table                     Men   Women  1/2 Average Risk   3.4   3.3  Average Risk       5.0   4.4  2 X Average Risk   9.6   7.1  3 X Average Risk  23.4   11.0        Use the calculated Patient Ratio above and the CHD Risk Table to determine the patient's CHD Risk.        ATP III CLASSIFICATION (LDL):  <100     mg/dL   Optimal  100-129  mg/dL   Near or Above                    Optimal  130-159  mg/dL   Borderline  160-189  mg/dL   High  >190     mg/dL   Very High    Physical Exam:    VS:  BP 122/64   Pulse (!) 52   Ht 6' (1.829 m)   Wt 155 lb (70.3 kg)   SpO2 96%   BMI 21.02 kg/m     Wt Readings from Last 3 Encounters:  07/31/19 155 lb (70.3 kg)  07/22/19 155 lb (70.3 kg)  07/18/19 155 lb (70.3 kg)     GEN:  Well nourished, well developed in no acute distress HEENT: Normal NECK: No JVD; No carotid bruits LYMPHATICS: No lymphadenopathy CARDIAC: RRR, no murmurs, rubs, gallops RESPIRATORY:  Clear to auscultation without rales, wheezing or  rhonchi  ABDOMEN: Soft, non-tender, non-distended MUSCULOSKELETAL:  No edema; No deformity  SKIN: Warm and dry NEUROLOGIC:  Alert and oriented x 3 PSYCHIATRIC:  Normal affect   ASSESSMENT:    1. Atrial fibrillation, persistent ([place])   2. Coronary artery disease involving native coronary artery of native heart without angina pectoris   3. Small cell lung cancer ([place])   4. Thoracic aortic aneurysm without rupture ([place])   5. Nonrheumatic aortic valve  insufficiency   6. Hyperlipidemia LDL goal <70    PLAN:    In order of problems listed above:  1. Persistent atrial fibrillation: Recently underwent DCCV, currently maintaining sinus rhythm.  Continue metoprolol and Xarelto  2. CAD: Denies any recent chest pain.  3. Hyperlipidemia: On Zocor  4. Small cell lung cancer: Managed by oncology service   5. Thoracic aortic aneurysm: Recent image showed a 4 cm ascending aorta  6. Aortic valve insufficiency: Last echocardiogram obtained in March 2021 showed mild to moderate AI   Medication Adjustments/Labs and Tests Ordered: Current medicines are reviewed at length with the patient today.  Concerns regarding medicines are outlined above.  Orders Placed This Encounter  Procedures  . EKG 12-Lead   No orders of the defined types were placed in this encounter.   Patient Instructions  Medication Instructions:  Your physician recommends that you continue on your current medications as directed. Please refer to the Current Medication list given to you today.  *If you need a refill on your cardiac medications before your next appointment, please call your pharmacy*  Lab Work: NONE ordered at this time of appointment   If you have labs (blood work) drawn today and your tests are completely normal, you will receive your results only by: Marland Kitchen MyChart Message (if you have MyChart) OR . A paper copy in the mail If you have any lab test that is abnormal or we need to change your treatment, we will call you to review the results.  Testing/Procedures: NONE ordered at this time of appointment   Follow-Up: At [facility], you and your health needs are our priority.  As part of our continuing mission to provide you with exceptional heart care, we have created designated Provider Care Teams.  These Care Teams include your primary Cardiologist (physician) and Advanced Practice Providers (APPs -  Physician Assistants and Nurse Practitioners) who all  work together to provide you with the care you need, when you need it.  Your next appointment:   3-4 month(s)  The format for your next appointment:   In Person  Provider:   Shelva Majestic, MD  Other Instructions      Signed, Almyra Deforest, [place]  08/02/2019 11:26 PM    [place]

## 2019-08-02 ENCOUNTER — Encounter: Payer: Self-pay | Admitting: Physician Assistant

## 2019-10-28 ENCOUNTER — Other Ambulatory Visit: Payer: Self-pay | Admitting: Family Medicine

## 2019-10-28 DIAGNOSIS — M810 Age-related osteoporosis without current pathological fracture: Secondary | ICD-10-CM

## 2019-11-18 ENCOUNTER — Encounter: Payer: Self-pay | Admitting: Cardiovascular Disease

## 2019-11-18 ENCOUNTER — Ambulatory Visit (INDEPENDENT_AMBULATORY_CARE_PROVIDER_SITE_OTHER): Payer: Medicare Other | Admitting: Cardiovascular Disease

## 2019-11-18 ENCOUNTER — Other Ambulatory Visit: Payer: Self-pay

## 2019-11-18 VITALS — BP 130/74 | HR 51 | Ht 72.0 in | Wt 156.8 lb

## 2019-11-18 DIAGNOSIS — C3490 Malignant neoplasm of unspecified part of unspecified bronchus or lung: Secondary | ICD-10-CM

## 2019-11-18 DIAGNOSIS — Z7901 Long term (current) use of anticoagulants: Secondary | ICD-10-CM | POA: Diagnosis not present

## 2019-11-18 DIAGNOSIS — I712 Thoracic aortic aneurysm, without rupture, unspecified: Secondary | ICD-10-CM

## 2019-11-18 DIAGNOSIS — E785 Hyperlipidemia, unspecified: Secondary | ICD-10-CM

## 2019-11-18 DIAGNOSIS — I2510 Atherosclerotic heart disease of native coronary artery without angina pectoris: Secondary | ICD-10-CM

## 2019-11-18 DIAGNOSIS — I480 Paroxysmal atrial fibrillation: Secondary | ICD-10-CM | POA: Diagnosis not present

## 2019-11-18 NOTE — Patient Instructions (Signed)

## 2019-11-18 NOTE — Progress Notes (Signed)
Cardiology Office Note    Date:  11/18/2019   ID:  Gerald [name] 12/15/1940, MRN [id number]  PCP:  Antony Contras, MD  Cardiologist:  Shelva Majestic, MD   No chief complaint on file.   History of Present Illness:  Gerald [name] is a 79 y.o.  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 Between November 2012 in March 2013 he completed 6 cycles of carboplatin, etoposide. He is status post TEE guided cardioversion in 2011. Initially he was treated with Coumadin therapy. He develop recurrent atrial fibrillation in November 2012 and since that time has been on xarelto therapy.  Since I last saw him, he has seen several of the extenders. 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 In December 2017 he saw Almyra Deforest, and had nondiagnostic lateral T changes On 03/30/2016 a nuclear perfusion study was normal with note scar or ischemia. Ejection fraction was 65%. He has had some issues with bradycardia and his dose of her blocker had been reduced.  He was recently seen in the emergency room in November 2018 and was in atrial fibrillation with rapid ventricular response. He underwent cardioversion in the emergency room. He was seen by Almyra Deforest in follow-up. He was bradycardic and as result, he was unable to further increase his metoprolol.  When I saw him in February 2019  he was doing well. He was onmetoprolol 25 mg twice a day in addition to simvastatin 40 mg and Xarelto 20 g daily.He denied palpitations, chest pain, awareness of atrial fibrillation.   He was seen in August 2019 by [name] and at that [facility] had noticed that he had taken 2 sublingual nitroglycerin over the month previously. He had atypical nonexertional chest pain. He underwent a nuclear perfusion study on November 23, 2017  which continued to show normal perfusion. There were no ECG changes. Post stress ejection fraction was 64%. Subsequently, he has continued to do well and specifically denies chest pain, PND orthopnea. He is unaware of palpitations presyncope or syncope.   He has been diagnosed with acute myeloid leukemia also was found to have additional lung nodules.  He has undergone radiation therapy last month.  On the evening of April 25, 2019 while sleeping he was awakened with his heart racing and also a sensation of chest heaviness.  EMS was called and he was found to be in atrial fibrillation with rapid ventricular response.  Presented to the emergency room and was given aspirin, nitroglycerin, and intravenous diltiazem.  He was anticoagulated on Xarelto.  L on the monitor he converted to sinus rhythm.  Follow-up Cardiologic evaluation was advised.    I last evaluated him in a telemedicine visit in February 2021.   The patient speaks [place] but his son was with him during the interview who acted as the interpreter.  Since my last evaluation, he has been seen by Almyra Deforest, PA on several occasions.  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  A Myoview study showed an EF at 62% with normal perfusion.  In April 2021 he developed recurrent atrial fibrillation with RVR when evaluated by Loma Sousa.  He  did not convert with further titration of metoprolol and ultimately went to the emergency room with right shoulder pain.  Troponin was negative.  And underwent successful cardioversion on July 22, 2019.  He was last evaluated by Almyra Deforest, PA on Jul 31, 2019.  The patient is here in the office today with his son who acts as his interpreter.  Patient feels well.  He is unaware of any breakthrough heart rate  irregularity.  I discussed with his son whether or not there is a potential component of sleep apnea since he has had recurrent atrial fibrillation.  Patient has been told that he does snore mildly by his wife.  However he sleeps well.  He denies daytime sleepiness.  There is no issues of gasping for breath.  He does take an occasional daytime nap.  With reference to his lung cancer this apparently has been stable.  He presents for reevaluation.  Past Medical History:  Diagnosis Date  . A-fib ([place])   . Allergy    seasonal allergies  . AML (acute myeloid leukemia) ([place]) 04/24/2013  . CAD (coronary artery disease)    last cath 2008 with noncritical CAD  . GERD (gastroesophageal reflux disease)   . Headache 12/17/2012  . Hyperlipidemia   . Leukemia ([place])   . Other pancytopenia ([place]) 04/09/2013  . Small cell lung cancer ([place]) 12/2010  . Smoking   . Unspecified deficiency anemia 04/16/2013    Past Surgical History:  Procedure Laterality Date  . CARDIAC CATHETERIZATION  01/12/2007   noncritical CAD, no change from cath of 05/20/2005. 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  Marland Kitchen CARDIOVERSION N/A 07/22/2019   Procedure: CARDIOVERSION;  Surgeon: Jerline Pain, MD;  Location: [facility] END[facility]COPY;  Service: Cardiovascular;  Laterality: N/A;  . NM [place]  01/23/2007   mild perfusion defect seen in the basal inferior and mid inferior regions - c/w attenuation defect. No ischemia present.    Current Medications: Outpatient Medications Prior to Visit  Medication Sig Dispense Refill  . metoprolol tartrate (LOPRESSOR) 25 MG tablet Take 1 tablet (25 mg total) by mouth 2 (two) times daily. 180 tablet 3  . nitroGLYCERIN (NITROSTAT) 0.4 MG SL tablet Place 1 tablet (0.4 mg total) under the tongue every 5 (five) minutes as needed for chest pain. 10 tablet 3  . predniSONE (DELTASONE) 5 MG tablet Take 6 pills for first day, 5 pills second day, 4 pills  third day, 3 pills fourth day, 2 pills the fifth day, and 1 pill sixth day. 21 tablet 0  . rivaroxaban (XARELTO) 20 MG TABS tablet Take 1 tablet (20 mg total) by mouth daily with supper. 90 tablet 3  . simvastatin (ZOCOR) 40 MG tablet Take 1 tablet (40 mg total) by mouth at bedtime. 90 tablet 3  . VITAMIN D PO Take 5,000 Units by mouth 3 (three) times a week.      No facility-administered medications prior to visit.     Allergies:   Patient has no known allergies.   Social History   Socioeconomic History  . Marital status: Married    Spouse name: Not on file  . Number of children: Not on file  . Years of education: Not on file  . Highest education level: Not on file  Occupational History  . Not on file  Tobacco Use  . Smoking status: Former Smoker    Packs/day: 1.00    Years: 50.00    Pack  years: 50.00    Types: Cigarettes    Quit date: 03/28/2006    Years since quitting: 13.6  . Smokeless tobacco: Never Used  Substance and Sexual Activity  . Alcohol use: No  . Drug use: No  . Sexual activity: Not on file  Other Topics Concern  . Not on file  Social History Narrative  . Not on file   Social Determinants of Health   Financial Resource Strain:   . Difficulty of Paying Living Expenses: Not on file  Food Insecurity:   . Worried About Charity fundraiser in the Last Year: Not on file  . Ran Out of Food in the Last Year: Not on file  Transportation Needs:   . Lack of Transportation (Medical): Not on file  . Lack of Transportation (Non-Medical): Not on file  Physical Activity:   . Days of Exercise per Week: Not on file  . Minutes of Exercise per Session: Not on file  Stress:   . Feeling of Stress : Not on file  Social Connections:   . Frequency of Communication with Friends and Family: Not on file  . Frequency of Social Gatherings with Friends and Family: Not on file  . Attends Religious Services: Not on file  . Active Member of Clubs or Organizations: Not on file  .  Attends Archivist Meetings: Not on file  . Marital Status: Not on file     Family History:  The patient's family history includes Healthy in his mother and son; Thyroid cancer in his sister.   ROS General: Negative; No fevers, chills, or night sweats;  HEENT: Negative; No changes in vision or hearing, sinus congestion, difficulty swallowing Pulmonary: Negative; No cough, wheezing, shortness of breath, hemoptysis Cardiovascular: Negative; No chest pain, presyncope, syncope, palpitations GI: Negative; No nausea, vomiting, diarrhea, or abdominal pain GU: Negative; No dysuria, hematuria, or difficulty voiding Musculoskeletal: Negative; no myalgias, joint pain, or weakness Hematologic/Oncology: Negative; no easy bruising, bleeding Endocrine: Negative; no heat/cold intolerance; no diabetes Neuro: Negative; no changes in balance, headaches Skin: Negative; No rashes or skin lesions Psychiatric: Negative; No behavioral problems, depression Sleep: Negative; No snoring, daytime sleepiness, hypersomnolence, bruxism, restless legs, hypnogognic hallucinations, no cataplexy Other comprehensive 14 point system review is negative.   PHYSICAL EXAM:   VS:  BP 130/74   Pulse (!) 51   Ht 6' (1.829 m)   Wt 156 lb 12.8 oz (71.1 kg)   SpO2 98%   BMI 21.27 kg/m     Repeat blood pressure by me was 130/76  Wt Readings from Last 3 Encounters:  11/18/19 156 lb 12.8 oz (71.1 kg)  07/31/19 155 lb (70.3 kg)  07/22/19 155 lb (70.3 kg)    General: Alert, oriented, no distress.  Skin: normal turgor, no rashes, warm and dry HEENT: Normocephalic, atraumatic. Pupils equal round and reactive to light; sclera anicteric; extraocular muscles intact; Nose without nasal septal hypertrophy Mouth/Parynx benign; Mallinpatti scale 3 Neck: No JVD, no carotid bruits; normal carotid upstroke Lungs: clear to ausculatation and percussion; no wheezing or rales Chest wall: without tenderness to palpitation Heart:  PMI not displaced, RRR, s1 s2 normal, 1/6 systolic murmur, no diastolic murmur, no rubs, gallops, thrills, or heaves Abdomen: soft, nontender; no hepatosplenomehaly, BS+; abdominal aorta nontender and not dilated by palpation. Back: no CVA tenderness Pulses 2+ Musculoskeletal: full range of motion, normal strength, no joint deformities Extremities: no clubbing cyanosis or edema, Homan's sign negative  Neurologic: grossly nonfocal; Cranial nerves grossly wnl  Psychologic: Normal mood and affect   Studies/Labs Reviewed:   EKG:  EKG is ordered today.  ECG (independently read by me): Sinus bradycardia 51 bpm.  No ectopy.  QTc interval 433 ms.  Recent Labs: BMP Latest Ref Rng & Units 07/17/2019 07/14/2019 04/25/2019  Glucose 65 - 99 mg/dL 92 121(H) 107(H)  BUN 8 - 27 mg/dL 16 18 17   Creatinine 0.76 - 1.27 mg/dL 1.12 1.28(H) 0.99  BUN/Creat Ratio 10 - 24 14 - -  Sodium 134 - 144 mmol/L 140 139 139  Potassium 3.5 - 5.2 mmol/L 4.3 4.0 3.8  Chloride 96 - 106 mmol/L 108(H) 109 109  CO2 20 - 29 mmol/L 18(L) 22 24  Calcium 8.6 - 10.2 mg/dL 8.6 9.0 8.2(L)     Hepatic Function Latest Ref Rng & Units 03/20/2015 04/24/2013 04/09/2013  Total Protein 6.1 - 8.1 g/dL 7.0 7.1 6.7  Albumin 3.6 - 5.1 g/dL 4.0 3.9 3.6  AST 10 - 35 U/L 23 14 14   ALT 9 - 46 U/L 30 11 10   Alk Phosphatase 40 - 115 U/L 64 69 61  Total Bilirubin 0.2 - 1.2 mg/dL 0.4 0.51 0.40    CBC Latest Ref Rng & Units 07/17/2019 07/14/2019 04/25/2019  WBC 3.4 - 10.8 x10E3/uL 4.9 5.1 5.9  Hemoglobin 13.0 - 17.7 g/dL 13.7 13.0 13.4  Hematocrit 37.5 - 51.0 % 41.2 38.3(L) 39.6  Platelets 150 - 450 x10E3/uL 157 132(L) 116(L)   Lab Results  Component Value Date   MCV 103 (H) 07/17/2019   MCV 99.0 07/14/2019   MCV 99.2 04/25/2019   Lab Results  Component Value Date   TSH 2.342 03/20/2015   Lab Results  Component Value Date   HGBA1C 5.8 (H) 01/23/2011     BNP    Component Value Date/Time   BNP 175.7 (H) 01/29/2017 0440    ProBNP  No results found for: PROBNP   Lipid Panel     Component Value Date/Time   CHOL  09/11/2009 0445    121        ATP III CLASSIFICATION:  <200     mg/dL   Desirable  200-239  mg/dL   Borderline High  >=240    mg/dL   High          TRIG 29 09/11/2009 0445   HDL 41 09/11/2009 0445   CHOLHDL 3.0 09/11/2009 0445   VLDL 6 09/11/2009 0445   LDLCALC  09/11/2009 0445    74        Total Cholesterol/HDL:CHD Risk Coronary Heart Disease Risk Table                     Men   Women  1/2 Average Risk   3.4   3.3  Average Risk       5.0   4.4  2 X Average Risk   9.6   7.1  3 X Average Risk  23.4   11.0        Use the calculated Patient Ratio above and the CHD Risk Table to determine the patient's CHD Risk.        ATP III CLASSIFICATION (LDL):  <100     mg/dL   Optimal  100-129  mg/dL   Near or Above                    Optimal  130-159  mg/dL   Borderline  160-189  mg/dL   High  >190  mg/dL   Very High     RADIOLOGY: No results found.   Additional studies/ records that were reviewed today include:  I reviewed the subsequent evaluations since his last office visit with me.   ASSESSMENT:    1. Paroxysmal atrial fibrillation (HCC)   2. Chronic anticoagulation   3. Small cell lung cancer ([place])   4. Thoracic aortic aneurysm without rupture ([place])   5. Coronary artery disease involving native coronary artery of native heart without angina pectoris   6. Hyperlipidemia LDL goal <70     PLAN:  Gerald [name] is an 79 year old [place] gentleman who has history of paroxysmal atrial fibrillation, nonobstructive CAD, small cell carcinoma of the lung, hyperlipidemia, as well as documented mild thoracic aortic dilatation.  Since his last evaluation with me in February 2021 he developed recurrent atrial fibrillation and required an additional cardioversion which was performed on July 22, 2019.  He is now on a medical regimen consisting of 25 mg twice a day.  ECG shows sinus bradycardia at 51  bpm.  Intervals are normal.  He continues to be on Xarelto for anticoagulation he denies bleeding.  He is on simvastatin 40 mg for hyperlipidemia.  LDL cholesterol in July 2021 was excellent at 3.  Small cell carcinoma is stable and he completed 6 cycles of carboplatin and etoposide between November 2012 in March 2013.  He does have mild aortic sclerosis without stenosis on echo Doppler study with mild to moderate aortic insufficiency.  With his PAF I did discuss the potential for sleep apnea.  There is a history of snoring.  He does take an occasional daytime nap.  He denies any awareness of awakening gasping for breath.  He believes his sleep is restorative.  He will continue current therapy.  I will see him in 6 months for reevaluation.   Medication Adjustments/Labs and Tests Ordered: Current medicines are reviewed at length with the patient today.  Concerns regarding medicines are outlined above.  Medication changes, Labs and Tests ordered today are listed in the Patient Instructions below. Patient Instructions  Medication Instructions:  CONTINUE WITH CURRENT MEDICATIONS. NO CHANGES.  *If you need a refill on your cardiac medications before your next appointment, please call your pharmacy*   Follow-Up: At [facility], you and your health needs are our priority.  As part of our continuing mission to provide you with exceptional heart care, we have created designated Provider Care Teams.  These Care Teams include your primary Cardiologist (physician) and Advanced Practice Providers (APPs -  Physician Assistants and Nurse Practitioners) who all work together to provide you with the care you need, when you need it.  We recommend signing up for the patient portal called "MyChart".  Sign up information is provided on this After Visit Summary.  MyChart is used to connect with patients for Virtual Visits (Telemedicine).  Patients are able to view lab/test results, encounter notes, upcoming  appointments, etc.  Non-urgent messages Gerald be sent to your provider as well.   To learn more about what you can do with MyChart, go to [email or web address].    Your next appointment:   6 month(s)  The format for your next appointment:   In Person  Provider:   Shelva Majestic, MD      Signed, Shelva Majestic, MD  11/18/2019 10:16 AM    [facility] [address], [place], [place], [place]  [address] Phone: [phone]

## 2020-01-07 ENCOUNTER — Telehealth: Payer: Self-pay | Admitting: Cardiovascular Disease

## 2020-01-07 NOTE — Telephone Encounter (Signed)
Pt c/o of Chest Pain: STAT if CP now or developed within 24 hours  1. Are you having CP right now? yes  2. Are you experiencing any other symptoms (ex. SOB, nausea, vomiting, sweating)? SOB  3. How long have you been experiencing CP? yesterday  4. Is your CP continuous or coming and going? Yesterday came and went, today continuous  5. Have you taken Nitroglycerin? Yes   Patient's daughter states the patient started having a heaviness in his chest yesterday. She states he took a nitroglycerin last night which helped, but it came back this morning. She states he is laying on the couch and is also having SOB. She states she is currently with the patient. ?

## 2020-01-07 NOTE — Telephone Encounter (Signed)
Per Dr Claiborne Billings. Schedule appt w/APP.  Returned call to daughter, Maudie Mercury. Scheduled appt 01/09/2020 @10 :45 AM w/Krista [name], [name], this is the only available appt this week. They will arrive early wearing a mask. Should any new sx occur or current sx worsen she will take pt to the ER. If unable to come to scheduled appt she will call and cancel. I have left future follow up w/ TK as tbey may need it.

## 2020-01-07 NOTE — Telephone Encounter (Signed)
Spoke with Maudie Mercury, daughter she states that pt is not having CP just heaviness and some dizziness. She has not taken his BP. This started Sunday.it has been intermittent since this Sunday. she states that the pt took nitro X3 on Sunday this did relieve the CP. He did take nitro this am and it did not help so, he does not want to take another. Informed daughter should heaviness or dizziness gets worse or CP should develop she should take pt to ER for evaluation. Please advise.

## 2020-01-08 ENCOUNTER — Telehealth: Payer: Self-pay | Admitting: Physician Assistant

## 2020-01-08 NOTE — Telephone Encounter (Signed)
Spoke with the pts daughter and she reported the pt has had palpitations with some dizziness and some chest pressure on and off since Saturday.. she says he is feeling well today except very tired... he has taken his meds daily and 1 nitro that seemed to help the chest pressure.   The pt has an appt tomorrow with Roby Lofts PA in the morning but advised her that if he does not feel well today then she should consider taking him to the ED. She verbalized understanding.

## 2020-01-08 NOTE — Telephone Encounter (Signed)
Follow Up:     Daughter called and wanted to see if pt coyld be seen by somebody today. She said  He is better, but still having symptoms.    Pt c/o of Chest Pain: STAT if CP now or developed within 24 hours  1. Are you having CP right now?  Heaviness iin chest  2. Are you experiencing any other symptoms (ex. SOB, nausea, vomiting, sweating)? dizziness  3. How long have you been experiencing CP? Since Saturday  4. Is your CP continuous or coming and feeling  this way for the last 2 days.  5. Have you taken Nitroglycerin? yes

## 2020-01-08 NOTE — Progress Notes (Signed)
Cardiology Office Note   Date:  01/09/2020   ID:  Gerald [name] 12/08/1940, MRN [id number]  PCP:  Antony Contras, MD  Cardiologist:  Shelva Majestic, MD EP: None  Chief Complaint  Patient presents with  . Chest Pain      History of Present Illness: Gerald [name] is a 79 y.o. male with a PMH of non-obstructive CAD, paroxysmal atrial fibrillation, HTN, HLD, SCLC s/p chemo and more recently NSCLC s/p radiation, AML in remission, who presents for the evaluation of chest pain.  He was last evaluated by cardiology at an outpatient visit with Dr. Claiborne Billings 11/18/19, at which time he was doing well from a cardiac standpoint without recent breakthrough atrial fibrillation. No medication changes occurred at this visit and he was recommended to follow-up in 6 months. His last echocardiogram 05/2019 showed EF 65-60%, G1DD, no RWMA, mildly elevated PA pressures, mild-moderate AI, and mild dilation of the ascending aorta. His last ischemic evaluation was a NST 05/2019 which showed no ischemia. His last cardiac catheterization was in 2008 which showed mild-moderate non-obstructive CAD in the LAD and RCA.   His daughter called the office 01/07/20 to report the patient had been experiencing chest pain, relieved with SL nitro the night before. He did have recurrent chest pain on the morning of 01/07/20 though did hot improve with nitro at that time. He was scheduled for this visit to further evaluate his symptoms. Daughter again called 01/08/20 to see if any visits were available for that day. She was advised to take him to the ED if symptoms worsened.   He presents today with his daughter for follow-up of his recent chest pain. He reports symptoms started on Sunday with sudden onset dizziness, palpitations, and chest pressure. He took 3 SL nitro and his chest pressure resolved. He continued to have intermittent symptoms since that time. Also with DOE since Sunday. Prior to Sunday he had no complaints of chest pain or  DOE. He has ongoing dizziness with position changes though no focal neurologic deficits. He reports compliance with his xarelto. No complaints of syncope, orthopnea, PND, LE edema, or bleeding.     Past Medical History:  Diagnosis Date  . A-fib ([place])   . Allergy    seasonal allergies  . 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 CAD (coronary artery disease)    last cath 2008 with noncritical CAD  . GERD (gastroesophageal reflux disease)   . Headache 12/17/2012  . Hyperlipidemia   . Leukemia ([place])   . Other pancytopenia ([place]) 04/09/2013  . 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 Smoking   . Unspecified deficiency anemia 04/16/2013    Past Surgical History:  Procedure Laterality Date  . CARDIAC CATHETERIZATION  01/12/2007   noncritical CAD, no change from cath of 05/20/2005. 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  Marland Kitchen CARDIOVERSION N/A 07/22/2019   Procedure: CARDIOVERSION;  Surgeon: Jerline Pain, MD;  Location: [facility] ENDOSCOPY;  Service: Cardiovascular;  Laterality: N/A;  . NM [place]  01/23/2007   mild perfusion defect seen in the basal inferior and mid inferior regions - c/w attenuation defect. No ischemia present.     Current Outpatient Medications  Medication Sig Dispense Refill  . metoprolol tartrate (LOPRESSOR) 25 MG tablet Take 1 tablet (25 mg total) by mouth 2 (two) times daily. 180 tablet 3  . nitroGLYCERIN (NITROSTAT) 0.4 MG SL tablet Place 1 tablet (0.4 mg total) under the  tongue every 5 (five) minutes as needed for chest pain. 10 tablet 3  . rivaroxaban (XARELTO) 20 MG TABS tablet Take 1 tablet (20 mg total) by mouth daily with supper. 90 tablet 3  . simvastatin (ZOCOR) 40 MG tablet Take 1 tablet (40 mg total) by mouth at bedtime. 90 tablet 3  . VITAMIN D PO Take 5,000 Units by mouth 3 (three) times a week.     . isosorbide mononitrate (IMDUR) 30 MG 24 hr tablet Take 1 tablet (30 mg total) by mouth  daily. 30 tablet 3   No current facility-administered medications for this visit.    Allergies:   Patient has no known allergies.    Social History:  The patient  reports that he quit smoking about 13 years ago. His smoking use included cigarettes. He has a 50.00 pack-year smoking history. He has never used smokeless tobacco. He reports that he does not drink alcohol and does not use drugs.   Family History:  The patient's family history includes Healthy in his mother and son; Thyroid cancer in his sister.    ROS:  Please see the history of present illness.   Otherwise, review of systems are positive for none.   All other systems are reviewed and negative.    PHYSICAL EXAM: VS:  BP 116/80   Pulse 93   Ht 5\' 10"  (1.778 m)   Wt 157 lb (71.2 kg)   SpO2 99%   BMI 22.53 kg/m  , BMI Body mass index is 22.53 kg/m. GEN: Well nourished, well developed, in no acute distress HEENT: sclera anicteric  Neck: no JVD, carotid bruits, or masses Cardiac: IRIR; no murmurs, rubs, or gallops,no edema  Respiratory:  clear to auscultation bilaterally, normal work of breathing GI: soft, nontender, nondistended, + BS MS: no deformity or atrophy Skin: warm and dry, no rash Neuro:  Strength and sensation are intact Psych: euthymic mood, full affect   EKG:  EKG is ordered today. The ekg ordered today demonstrates atrial fibrillation with rate 93 bpm, no STE/D.    Recent Labs: 07/17/2019: BUN 16; Creatinine, Ser 1.12; Hemoglobin 13.7; Platelets 157; Potassium 4.3; Sodium 140    Lipid Panel    Component Value Date/Time   CHOL  09/11/2009 0445    121        ATP III CLASSIFICATION:  <200     mg/dL   Desirable  200-239  mg/dL   Borderline High  >=240    mg/dL   High          TRIG 29 09/11/2009 0445   HDL 41 09/11/2009 0445   CHOLHDL 3.0 09/11/2009 0445   VLDL 6 09/11/2009 0445   LDLCALC  09/11/2009 0445    74        Total Cholesterol/HDL:CHD Risk Coronary Heart Disease Risk Table                      Men   Women  1/2 Average Risk   3.4   3.3  Average Risk       5.0   4.4  2 X Average Risk   9.6   7.1  3 X Average Risk  23.4   11.0        Use the calculated Patient Ratio above and the CHD Risk Table to determine the patient's CHD Risk.        ATP III CLASSIFICATION (LDL):  <100     mg/dL   Optimal  100-129  mg/dL   Near or Above                    Optimal  130-159  mg/dL   Borderline  160-189  mg/dL   High  >190     mg/dL   Very High      Wt Readings from Last 3 Encounters:  01/09/20 157 lb (71.2 kg)  11/18/19 156 lb 12.8 oz (71.1 kg)  07/31/19 155 lb (70.3 kg)      Other studies Reviewed: Additional studies/ records that were reviewed today include:   Echocardiogram 05/2019: 1. Left ventricular ejection fraction, by estimation, is 65 to 70%. The  left ventricle has normal function. The left ventricle has no regional  wall motion abnormalities. Left ventricular diastolic parameters are  consistent with Grade I diastolic  dysfunction (impaired relaxation).  2. Right ventricular systolic function is normal. The right ventricular  size is normal. There is mildly elevated pulmonary artery systolic  pressure.  3. The mitral valve is abnormal. Trivial mitral valve regurgitation.  4. The aortic valve is tricuspid. Aortic valve regurgitation is mild to  moderate. Mild aortic valve sclerosis is present, with no evidence of  aortic valve stenosis.  5. Aortic dilatation noted. There is mild dilatation of the ascending  aorta measuring 40 mm.  6. The inferior vena cava is normal in size with greater than 50%  respiratory variability, suggesting right atrial pressure of 3 mmHg.   NST 05/2019:  Nuclear stress EF: 62%.  The left ventricular ejection fraction is normal (55-65%).  There was no ST segment deviation noted during stress.  The study is normal.  This is a low risk study.     ASSESSMENT AND PLAN:  1. Chest pain in patient with a history of  non-obstructive CAD: Suspect chest pain is related to his atrial fibrillation though cannot exclude ischemia given prior history of mild-moderate disease on cath 10+ years ago.  Not on aspirin given need for xarelto.  - Low threshold to present to ED - precautions discussed - Continue metoprolol  - Will start imdur 30mg  daily given improvement in symptoms with SL nitro - Will check an echocardiogram to evaluate LV function  - If chest pain persists despite restoration of NSR will consider a cardiac catheterization  2. Paroxysmal atrial fibrillation: EKG with atrial fibrillation with rate 93 bpm today. Possible he has had variable rates which correlate with exacerbated symptoms. Suspect chest pain is related to recurrent Afib. He has not missed any doses of xarelto - Will plan for DCCV next week - Continue metoprolol tartrate for rate control - Continue xarelto for stroke ppx  3. HTN: BP 116/80 today - Continue metoprolol tatrate  4. HLD: LDL 69 09/2019 - Continue simvastatin  5. Ascending aortic aneurysm: mild at 97mm on last echo 05/2019 - Continue surveillance echos   Plan reviewed with Dr. Margaretann Loveless, DOD today.    Current medicines are reviewed at length with the patient today.  The patient does not have concerns regarding medicines.  The following changes have been made:  As above  Labs/ tests ordered today include:   Orders Placed This Encounter  Procedures  . CBC  . Basic metabolic panel  . EKG 12-Lead  . ECHOCARDIOGRAM COMPLETE     Disposition:   FU with me in 2 weeks  Signed, Abigail Butts, [name]  01/09/2020 1:57 PM

## 2020-01-09 ENCOUNTER — Telehealth: Payer: Self-pay | Admitting: Cardiovascular Disease

## 2020-01-09 ENCOUNTER — Other Ambulatory Visit: Payer: Self-pay

## 2020-01-09 ENCOUNTER — Encounter: Payer: Self-pay | Admitting: Medical

## 2020-01-09 ENCOUNTER — Ambulatory Visit (INDEPENDENT_AMBULATORY_CARE_PROVIDER_SITE_OTHER): Payer: Medicare Other | Admitting: Medical

## 2020-01-09 VITALS — BP 116/80 | HR 93 | Ht 70.0 in | Wt 157.0 lb

## 2020-01-09 DIAGNOSIS — I25119 Atherosclerotic heart disease of native coronary artery with unspecified angina pectoris: Secondary | ICD-10-CM

## 2020-01-09 DIAGNOSIS — Z79899 Other long term (current) drug therapy: Secondary | ICD-10-CM

## 2020-01-09 DIAGNOSIS — I480 Paroxysmal atrial fibrillation: Secondary | ICD-10-CM | POA: Diagnosis not present

## 2020-01-09 DIAGNOSIS — E785 Hyperlipidemia, unspecified: Secondary | ICD-10-CM

## 2020-01-09 DIAGNOSIS — R079 Chest pain, unspecified: Secondary | ICD-10-CM

## 2020-01-09 DIAGNOSIS — I712 Thoracic aortic aneurysm, without rupture, unspecified: Secondary | ICD-10-CM

## 2020-01-09 DIAGNOSIS — I10 Essential (primary) hypertension: Secondary | ICD-10-CM

## 2020-01-09 MED ORDER — ISOSORBIDE MONONITRATE ER 30 MG PO TB24
30.0000 mg | ORAL_TABLET | Freq: Every day | ORAL | 3 refills | Status: DC
Start: 2020-01-09 — End: 2021-05-28

## 2020-01-09 NOTE — Patient Instructions (Addendum)
Testing/Procedures:  Echocardiogram - Your physician has requested that you have an echocardiogram. Echocardiography is a painless test that uses sound waves to create images of your heart. It provides your doctor with information about the size and shape of your heart and how well your heart's chambers and valves are working. This procedure takes approximately one hour. There are no restrictions for this procedure. This will be performed at our [facility] location - [address].  Dear Mr Gerald [name] are scheduled for a Cardioversion on 01-16-2019 with Dr. Gardiner Rhyme at 930am.  Please arrive at the [facility] (Main Entrance A) at [facility]: [place], [place] [address] at 830 am. (1 hour prior to procedure)  COVID TESTING: Monday at [place], [place]  DIET: Nothing to eat or drink after midnight except a sip of water with medications (see medication instructions below)  Medication Instructions: Continue your anticoagulant: Xarelto  You will need to continue your anticoagulant after your procedure until you are told by your Provider that it is safe to stop.   Labs:  Come to: Here in our office-Today   You must have a responsible person to drive you home and stay in the waiting area during your procedure. Failure to do so could result in cancellation.  Bring your insurance cards.  *Special Note: Every effort is made to have your procedure done on time. Occasionally there are emergencies that occur at the hospital that may cause delays. Please be patient if a delay does occur.

## 2020-01-09 NOTE — Telephone Encounter (Signed)
Called patient daughter Olean Ree)  to give details about rescheduled echo that is needed before cardioversion, she would like someone to call her and explain why he needs the cardioversion that he just had not long ago  Best contact number [phone]

## 2020-01-10 ENCOUNTER — Other Ambulatory Visit (HOSPITAL_COMMUNITY): Payer: Medicare Other

## 2020-01-10 LAB — BASIC METABOLIC PANEL
BUN/Creatinine Ratio: 18 (ref 10–24)
BUN: 20 mg/dL (ref 8–27)
CO2: 23 mmol/L (ref 20–29)
Calcium: 8.8 mg/dL (ref 8.6–10.2)
Chloride: 106 mmol/L (ref 96–106)
Creatinine, Ser: 1.09 mg/dL (ref 0.76–1.27)
GFR calc Af Amer: 74 mL/min/{1.73_m2} (ref >59)
GFR calc non Af Amer: 64 mL/min/{1.73_m2} (ref >59)
Glucose: 95 mg/dL (ref 65–99)
Potassium: 4.4 mmol/L (ref 3.5–5.2)
Sodium: 141 mmol/L (ref 134–144)

## 2020-01-10 LAB — CBC
Hematocrit: 42.1 % (ref 37.5–51.0)
Hemoglobin: 13.9 g/dL (ref 13.0–17.7)
MCH: 34 pg — ABNORMAL HIGH (ref 26.6–33.0)
MCHC: 33 g/dL (ref 31.5–35.7)
MCV: 103 fL — ABNORMAL HIGH (ref 79–97)
Platelets: 130 10*3/uL — ABNORMAL LOW (ref 150–450)
RBC: 4.09 x10E6/uL — ABNORMAL LOW (ref 4.14–5.80)
RDW: 12.8 % (ref 11.6–15.4)
WBC: 6.4 10*3/uL (ref 3.4–10.8)

## 2020-01-13 ENCOUNTER — Other Ambulatory Visit (HOSPITAL_COMMUNITY)
Admission: RE | Admit: 2020-01-13 | Discharge: 2020-01-13 | Disposition: A | Discharge status: Home / Self Care | Payer: Medicare Other | Source: Ambulatory Visit | Attending: Cardiology | Admitting: Cardiology

## 2020-01-13 DIAGNOSIS — Z20822 Contact with and (suspected) exposure to covid-19: Secondary | ICD-10-CM | POA: Diagnosis not present

## 2020-01-13 DIAGNOSIS — Z01818 Encounter for other preprocedural examination: Secondary | ICD-10-CM | POA: Insufficient documentation

## 2020-01-13 LAB — SARS CORONAVIRUS 2 (TAT 6-24 HRS): SARS Coronavirus 2: NEGATIVE

## 2020-01-14 ENCOUNTER — Ambulatory Visit (HOSPITAL_COMMUNITY)
Admission: RE | Admit: 2020-01-14 | Discharge: 2020-01-14 | Disposition: A | Discharge status: Home / Self Care | Payer: Medicare Other | Source: Ambulatory Visit | Attending: Medical | Admitting: Medical

## 2020-01-14 ENCOUNTER — Other Ambulatory Visit: Payer: Self-pay

## 2020-01-14 DIAGNOSIS — F17200 Nicotine dependence, unspecified, uncomplicated: Secondary | ICD-10-CM | POA: Diagnosis not present

## 2020-01-14 DIAGNOSIS — R079 Chest pain, unspecified: Secondary | ICD-10-CM | POA: Diagnosis present

## 2020-01-14 DIAGNOSIS — I480 Paroxysmal atrial fibrillation: Secondary | ICD-10-CM | POA: Diagnosis not present

## 2020-01-14 DIAGNOSIS — E785 Hyperlipidemia, unspecified: Secondary | ICD-10-CM | POA: Insufficient documentation

## 2020-01-14 DIAGNOSIS — I351 Nonrheumatic aortic (valve) insufficiency: Secondary | ICD-10-CM | POA: Insufficient documentation

## 2020-01-14 LAB — ECHOCARDIOGRAM COMPLETE
P 1/2 time: 460 msec
Radius: 0.4 cm
S' Lateral: 2.2 cm

## 2020-01-14 NOTE — Progress Notes (Signed)
  Echocardiogram 2D Echocardiogram has been performed.  Fidel Levy 01/14/2020, 4:07 PM

## 2020-01-16 ENCOUNTER — Encounter (HOSPITAL_COMMUNITY): Admission: RE | Payer: Self-pay | Source: Home / Self Care

## 2020-01-16 ENCOUNTER — Telehealth: Payer: Self-pay | Admitting: Cardiovascular Disease

## 2020-01-16 ENCOUNTER — Ambulatory Visit (HOSPITAL_COMMUNITY): Admission: RE | Admit: 2020-01-16 | Payer: Medicare Other | Source: Home / Self Care | Admitting: Cardiology

## 2020-01-16 ENCOUNTER — Encounter (HOSPITAL_COMMUNITY): Payer: Self-pay | Admitting: Anesthesiology

## 2020-01-16 SURGERY — CARDIOVERSION
Anesthesia: Monitor Anesthesia Care

## 2020-01-16 NOTE — Telephone Encounter (Signed)
New Message  Pts daughter called after hours line and is wanting to cancel cardioversion appt on 10/21@ 9:30am. He's feeling better and will call back if he needs to reschedule

## 2020-01-16 NOTE — Telephone Encounter (Signed)
Attempted to call the daughter back. Someone answered and then the line was dropped. Attempted to call back and there was no answer.   Please see other phone note from Pre-Admit. The patient has a follow up with [place] on 01/20/20

## 2020-01-16 NOTE — Progress Notes (Signed)
Admitting called patient with interpreter and patient stated he was not coming. Dr. Gardiner Rhyme aware

## 2020-01-18 NOTE — Progress Notes (Signed)
Cardiology Office Note   Date:  01/20/2020   ID:  [name], [name] [birth year], MRN [id number]  PCP:  Antony Contras, MD  Cardiologist:  Shelva Majestic, MD EP: None  Chief Complaint  Patient presents with  . Follow-up    chest pain, atrial fibrillation      History of Present Illness: Gerald [name] is a 79 y.o. male with a PMH of non-obstructive CAD, paroxysmal atrial fibrillation, HTN, HLD, SCLC s/p chemo and more recently NSCLC s/p radiation, AML in remission, who presents for follow-up evaluation of chest pain.  He was last evaluated by cardiology at an outpatient visit with myself 01/09/20, at which time he reported recent chest pain, dizziness, palpitations, and DOE. He was found to be in atrial fibrillation at that visit. Decision made to pursue DCCV given compliance with his xarelto and symptoms onset likely coincided with recurrent afib. An echocardiogram was obtained to evaluate LV function and wall motion which showed EF 60-65%, indeterminate LV diastolic function, mild LVH, no RWMA, mild LAE, mild MR/AI, and mild dilation of the ascending aorta (4mm). DCCV was scheduled for 01/16/20, however patient cancelled due to feeling better. His last ischemic evaluation was a NST 05/2019 which showed no ischemia. His last cardiac catheterization was in 2008 which showed mild-moderate non-obstructive CAD in the LAD and RCA.   He presents today with his daughter and a vietnamese interpreter for follow-up of his recent chest pain. His symptoms have 100% resolved. He is back in sinus rhythm today. Seems as though he tolerates his atrial fibrillation episodes very poorly. He has no complaints of chest pain, SOB, DOE, palpitations, dizziness, lightheadedness, or syncope.      Past Medical History:  Diagnosis Date  . A-fib ([place])   . Allergy    seasonal allergies  . 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 CAD (coronary artery disease)    last cath 2008 with noncritical CAD  . GERD  (gastroesophageal reflux disease)   . Headache 12/17/2012  . Hyperlipidemia   . Leukemia ([place])   . Other pancytopenia ([place]) 04/09/2013  . 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 Smoking   . Unspecified deficiency anemia 04/16/2013    Past Surgical History:  Procedure Laterality Date  . CARDIAC CATHETERIZATION  01/12/2007   noncritical CAD, no change from cath of 05/20/2005. 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  Marland Kitchen CARDIOVERSION N/A 07/22/2019   Procedure: CARDIOVERSION;  Surgeon: Jerline Pain, MD;  Location: [facility] ENDOSCOPY;  Service: Cardiovascular;  Laterality: N/A;  . NM [place]  01/23/2007   mild perfusion defect seen in the basal inferior and mid inferior regions - c/w attenuation defect. No ischemia present.     Current Outpatient Medications  Medication Sig Dispense Refill  . isosorbide mononitrate (IMDUR) 30 MG 24 hr tablet Take 1 tablet (30 mg total) by mouth daily. 30 tablet 3  . metoprolol tartrate (LOPRESSOR) 25 MG tablet Take 1 tablet (25 mg total) by mouth 2 (two) times daily. 180 tablet 3  . nitroGLYCERIN (NITROSTAT) 0.4 MG SL tablet Place 1 tablet (0.4 mg total) under the tongue every 5 (five) minutes as needed for chest pain. 10 tablet 3  . rivaroxaban (XARELTO) 20 MG TABS tablet Take 1 tablet (20 mg total) by mouth daily with supper. 90 tablet 3  . simvastatin (ZOCOR) 40 MG tablet Take 1 tablet (40 mg total) by mouth at bedtime. 90 tablet 3  .  VITAMIN D PO Take 2,000 Units by mouth every other day.      No current facility-administered medications for this visit.    Allergies:   Patient has no known allergies.    Social History:  The patient  reports that he quit smoking about 13 years ago. His smoking use included cigarettes. He has a 50.00 pack-year smoking history. He has never used smokeless tobacco. He reports that he does not drink alcohol and does not use drugs.   Family History:  The patient's  family history includes Healthy in his mother and son; Thyroid cancer in his sister.    ROS:  Please see the history of present illness.   Otherwise, review of systems are positive for none.   All other systems are reviewed and negative.    PHYSICAL EXAM: VS:  BP 120/72   Pulse (!) 53   Ht 5\' 10"  (1.778 m)   Wt 159 lb 9.6 oz (72.4 kg)   BMI 22.90 kg/m  , BMI Body mass index is 22.9 kg/m. GEN: Well nourished, well developed, in no acute distress HEENT: normal Neck: no JVD, carotid bruits, or masses Cardiac: RRR; no murmurs, rubs, or gallops,no edema  Respiratory:  clear to auscultation bilaterally, normal work of breathing GI: soft, nontender, nondistended, + BS MS: no deformity or atrophy Skin: warm and dry, no rash Neuro:  Strength and sensation are intact Psych: euthymic mood, full affect   EKG:  EKG is ordered today. The ekg ordered today demonstrates sinus bradycardia, rate 53 bpm, no STE/D, no TWI; prior EKG with atrial fibillation   Recent Labs: 01/09/2020: BUN 20; Creatinine, Ser 1.09; Hemoglobin 13.9; Platelets 130; Potassium 4.4; Sodium 141    Lipid Panel    Component Value Date/Time   CHOL  09/11/2009 0445    121        ATP III CLASSIFICATION:  <200     mg/dL   Desirable  200-239  mg/dL   Borderline High  >=240    mg/dL   High          TRIG 29 09/11/2009 0445   HDL 41 09/11/2009 0445   CHOLHDL 3.0 09/11/2009 0445   VLDL 6 09/11/2009 0445   LDLCALC  09/11/2009 0445    74        Total Cholesterol/HDL:CHD Risk Coronary Heart Disease Risk Table                     Men   Women  1/2 Average Risk   3.4   3.3  Average Risk       5.0   4.4  2 X Average Risk   9.6   7.1  3 X Average Risk  23.4   11.0        Use the calculated Patient Ratio above and the CHD Risk Table to determine the patient's CHD Risk.        ATP III CLASSIFICATION (LDL):  <100     mg/dL   Optimal  100-129  mg/dL   Near or Above                    Optimal  130-159  mg/dL    Borderline  160-189  mg/dL   High  >190     mg/dL   Very High      Wt Readings from Last 3 Encounters:  01/20/20 159 lb 9.6 oz (72.4 kg)  01/09/20 157 lb (71.2 kg)  11/18/19  156 lb 12.8 oz (71.1 kg)      Other studies Reviewed: Additional studies/ records that were reviewed today include:   Echocardiogram 01/14/20: 1. Left ventricular ejection fraction, by estimation, is 60 to 65%. The  left ventricle has normal function. The left ventricle has no regional  wall motion abnormalities. There is mild left ventricular hypertrophy.  Left ventricular diastolic function  could not be evaluated.  2. Right ventricular systolic function is normal. The right ventricular  size is normal.  3. Left atrial size was mildly dilated.  4. The mitral valve is abnormal. Mild mitral valve regurgitation.  5. The aortic valve is tricuspid. Aortic valve regurgitation is mild.  6. Aortic dilatation noted. There is mild dilatation of the ascending  aorta, measuring 39 mm.  7. The inferior vena cava is normal in size with greater than 50%  respiratory variability, suggesting right atrial pressure of 3 mmHg.   Echocardiogram 05/2019: 1. Left ventricular ejection fraction, by estimation, is 65 to 70%. The  left ventricle has normal function. The left ventricle has no regional  wall motion abnormalities. Left ventricular diastolic parameters are  consistent with Grade I diastolic  dysfunction (impaired relaxation).  2. Right ventricular systolic function is normal. The right ventricular  size is normal. There is mildly elevated pulmonary artery systolic  pressure.  3. The mitral valve is abnormal. Trivial mitral valve regurgitation.  4. The aortic valve is tricuspid. Aortic valve regurgitation is mild to  moderate. Mild aortic valve sclerosis is present, with no evidence of  aortic valve stenosis.  5. Aortic dilatation noted. There is mild dilatation of the ascending  aorta measuring 40 mm.    6. The inferior vena cava is normal in size with greater than 50%  respiratory variability, suggesting right atrial pressure of 3 mmHg.   NST 05/2019:  Nuclear stress EF: 62%.  The left ventricular ejection fraction is normal (55-65%).  There was no ST segment deviation noted during stress.  The study is normal.  This is a low risk study.    ASSESSMENT AND PLAN:  1. Chest pain in patient with a history of non-obstructive CAD: Not on aspirin given need for xarelto. Recent echo was reassuring. CP now resolved with restoration of sinus rhythm - Continue metoprolol and imdur  2. Paroxysmal atrial fibrillation: EKG with sinus bradycardia today. Symptoms have completely resolve with restoration of sinus rhythm. He clearly does not tolerate Afib well. He has not missed any doses of xarelto - Will refer to EP for ablation vs antiarrhythmic options.  - Continue metoprolol tartrate for rate control - Continue xarelto for stroke ppx  3. HTN: BP 120/72 today - Continue metoprolol tatrate  4. HLD: LDL 69 09/2019 - Continue simvastatin  5. Ascending aortic aneurysm: mild at 28mm on last echo 01/14/20 - Continue surveillance echos  6. 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  The patient does not have concerns regarding medicines.  The following changes have been made:  As above  Labs/ tests ordered today include:   Orders Placed This Encounter  Procedures  . Ambulatory referral to Cardiac Electrophysiology  . EKG 12-Lead     Disposition:   FU with Dr. Claiborne Billings in 3 months  Signed, Abigail Butts, [name]  01/20/2020 4:14 PM

## 2020-01-20 ENCOUNTER — Ambulatory Visit (INDEPENDENT_AMBULATORY_CARE_PROVIDER_SITE_OTHER): Payer: Medicare Other | Admitting: Medical

## 2020-01-20 ENCOUNTER — Encounter: Payer: Self-pay | Admitting: Medical

## 2020-01-20 ENCOUNTER — Other Ambulatory Visit: Payer: Self-pay

## 2020-01-20 VITALS — BP 120/72 | HR 53 | Ht 70.0 in | Wt 159.6 lb

## 2020-01-20 DIAGNOSIS — I25119 Atherosclerotic heart disease of native coronary artery with unspecified angina pectoris: Secondary | ICD-10-CM | POA: Diagnosis not present

## 2020-01-20 DIAGNOSIS — I480 Paroxysmal atrial fibrillation: Secondary | ICD-10-CM | POA: Diagnosis not present

## 2020-01-20 DIAGNOSIS — I712 Thoracic aortic aneurysm, without rupture, unspecified: Secondary | ICD-10-CM

## 2020-01-20 DIAGNOSIS — R079 Chest pain, unspecified: Secondary | ICD-10-CM | POA: Diagnosis not present

## 2020-01-20 DIAGNOSIS — E785 Hyperlipidemia, unspecified: Secondary | ICD-10-CM

## 2020-01-20 DIAGNOSIS — I10 Essential (primary) hypertension: Secondary | ICD-10-CM | POA: Diagnosis not present

## 2020-01-20 NOTE — Patient Instructions (Signed)
Medication Instructions:  Continue current medications  *If you need a refill on your cardiac medications before your next appointment, please call your pharmacy*   Lab Work: None Ordered   Testing/Procedures: None Ordered   Follow-Up: At [facility], you and your health needs are our priority.  As part of our continuing mission to provide you with exceptional heart care, we have created designated Provider Care Teams.  These Care Teams include your primary Cardiologist (physician) and Advanced Practice Providers (APPs -  Physician Assistants and Nurse Practitioners) who all work together to provide you with the care you need, when you need it.  We recommend signing up for the patient portal called "MyChart".  Sign up information is provided on this After Visit Summary.  MyChart is used to connect with patients for Virtual Visits (Telemedicine).  Patients are able to view lab/test results, encounter notes, upcoming appointments, etc.  Non-urgent messages can be sent to your provider as well.   To learn more about what you can do with MyChart, go to [email or web address].    Your next appointment:   3 month(s)  The format for your next appointment:   In Person  Provider:   You may see Shelva Majestic, MD or one of the following Advanced Practice Providers on your designated Care Team:    Almyra Deforest, [name]  Fabian Sharp, [name] or   Roby Lofts, [place]    Other Instructions You have been referred to Dr Curt Bears Electrophysiologist

## 2020-01-27 ENCOUNTER — Ambulatory Visit
Admission: RE | Admit: 2020-01-27 | Discharge: 2020-01-27 | Disposition: A | Discharge status: Home / Self Care | Payer: Medicare Other | Source: Ambulatory Visit | Attending: Family Medicine | Admitting: Family Medicine

## 2020-01-27 ENCOUNTER — Other Ambulatory Visit: Payer: Self-pay

## 2020-01-27 DIAGNOSIS — M810 Age-related osteoporosis without current pathological fracture: Secondary | ICD-10-CM

## 2020-01-29 ENCOUNTER — Other Ambulatory Visit (HOSPITAL_COMMUNITY): Payer: Medicare Other

## 2020-02-04 ENCOUNTER — Other Ambulatory Visit (HOSPITAL_COMMUNITY): Payer: Self-pay | Admitting: Physician Assistant

## 2020-02-04 ENCOUNTER — Institutional Professional Consult (permissible substitution): Payer: Medicare Other | Admitting: Cardiology

## 2020-02-04 NOTE — Progress Notes (Signed)
I connected by phone with Gerald [name] on 02/04/2020 at 5:26 PM to discuss the potential use of a new treatment for mild to moderate COVID-19 viral infection in non-hospitalized patients.  This patient is a 79 y.o. male that meets the FDA criteria for Emergency Use Authorization of COVID monoclonal antibody casirivimab/imdevimab, bamlanivimab/eteseviamb, or sotrovimab.  Has a (+) direct SARS-CoV-2 viral test result  Has mild or moderate COVID-19   Is NOT hospitalized due to COVID-19  Is within 10 days of symptom onset  Has at least one of the high risk factor(s) for progression to severe COVID-19 and/or hospitalization as defined in EUA.  Specific high risk criteria : Older age (>/= 81 yo) and Immunosuppressive Disease or Treatment   I have spoken and communicated the following to the patient or parent/caregiver regarding COVID monoclonal antibody treatment:  1. FDA has authorized the emergency use for the treatment of mild to moderate COVID-19 in adults and pediatric patients with positive results of direct SARS-CoV-2 viral testing who are 34 years of age and older weighing at least 40 kg, and who are at high risk for progressing to severe COVID-19 and/or hospitalization.  2. The significant known and potential risks and benefits of COVID monoclonal antibody, and the extent to which such potential risks and benefits are unknown.  3. Information on available alternative treatments and the risks and benefits of those alternatives, including clinical trials.  4. Patients treated with COVID monoclonal antibody should continue to self-isolate and use infection control measures (e.g., wear mask, isolate, social distance, avoid sharing personal items, clean and disinfect "high touch" surfaces, and frequent handwashing) according to CDC guidelines.   5. The patient or parent/caregiver has the option to accept or refuse COVID monoclonal antibody treatment.  After reviewing this information with  the patient, the patient has agreed to receive one of the available covid 19 monoclonal antibodies and will be provided an appropriate fact sheet prior to infusion. Konrad Felix, [name] 02/04/2020 5:26 PM

## 2020-02-05 ENCOUNTER — Ambulatory Visit (HOSPITAL_COMMUNITY)
Admission: RE | Admit: 2020-02-05 | Discharge: 2020-02-05 | Disposition: A | Discharge status: Home / Self Care | Payer: Medicare Other | Source: Ambulatory Visit | Attending: Pulmonary Disease | Admitting: Pulmonary Disease

## 2020-02-05 ENCOUNTER — Other Ambulatory Visit (HOSPITAL_COMMUNITY): Payer: Self-pay

## 2020-02-05 DIAGNOSIS — U071 COVID-19: Secondary | ICD-10-CM | POA: Insufficient documentation

## 2020-02-05 DIAGNOSIS — Z23 Encounter for immunization: Secondary | ICD-10-CM | POA: Diagnosis not present

## 2020-02-05 MED ORDER — FAMOTIDINE IN NACL 20-0.9 MG/50ML-% IV SOLN: 20.0000 mg | Freq: Once | INTRAVENOUS | Status: DC | PRN

## 2020-02-05 MED ORDER — ALBUTEROL SULFATE HFA 108 (90 BASE) MCG/ACT IN AERS: 2.0000 | INHALATION_SPRAY | Freq: Once | RESPIRATORY_TRACT | Status: DC | PRN

## 2020-02-05 MED ORDER — DIPHENHYDRAMINE HCL 50 MG/ML IJ SOLN: 50.0000 mg | Freq: Once | INTRAMUSCULAR | Status: DC | PRN

## 2020-02-05 MED ORDER — SODIUM CHLORIDE 0.9 % IV BOLUS
1000.0000 mL | Freq: Once | INTRAVENOUS | Status: AC
  Administered 2020-02-05: 1000 mL via INTRAVENOUS

## 2020-02-05 MED ORDER — METHYLPREDNISOLONE SODIUM SUCC 125 MG IJ SOLR: 125.0000 mg | Freq: Once | INTRAMUSCULAR | Status: DC | PRN

## 2020-02-05 MED ORDER — SODIUM CHLORIDE 0.9 % IV SOLN: INTRAVENOUS | Status: DC | PRN

## 2020-02-05 MED ORDER — EPINEPHRINE 0.3 MG/0.3ML IJ SOAJ: 0.3000 mg | Freq: Once | INTRAMUSCULAR | Status: DC | PRN

## 2020-02-05 MED ORDER — SOTROVIMAB 500 MG/8ML IV SOLN
500.0000 mg | Freq: Once | INTRAVENOUS | Status: AC
  Administered 2020-02-05: 500 mg via INTRAVENOUS

## 2020-02-05 NOTE — Discharge Instructions (Signed)

## 2020-02-05 NOTE — Progress Notes (Signed)
Diagnosis: COVID-19  Physician: Dr. Joya Gaskins  Procedure: [facility] Med: Sotrovimab infusion - Provided patient with Sotrovimab fact sheet for patients, parents and caregivers prior to infusion.  Complications: No immediate complications noted.  Discharge: Discharged home   Gerald [name] 02/05/2020

## 2020-02-13 ENCOUNTER — Institutional Professional Consult (permissible substitution): Payer: Medicare Other | Admitting: Cardiology

## 2020-03-10 ENCOUNTER — Institutional Professional Consult (permissible substitution): Payer: Medicare Other | Admitting: Cardiology

## 2020-04-21 ENCOUNTER — Other Ambulatory Visit: Payer: Self-pay

## 2020-04-21 ENCOUNTER — Encounter: Payer: Self-pay | Admitting: Cardiology

## 2020-04-21 ENCOUNTER — Ambulatory Visit (INDEPENDENT_AMBULATORY_CARE_PROVIDER_SITE_OTHER): Payer: Medicare Other | Admitting: Cardiology

## 2020-04-21 VITALS — BP 136/70 | HR 56 | Ht 70.0 in | Wt 159.0 lb

## 2020-04-21 DIAGNOSIS — I480 Paroxysmal atrial fibrillation: Secondary | ICD-10-CM | POA: Diagnosis not present

## 2020-04-21 MED ORDER — DRONEDARONE HCL 400 MG PO TABS: 400.0000 mg | ORAL_TABLET | Freq: Two times a day (BID) | ORAL | 3 refills | Status: DC

## 2020-04-21 NOTE — Progress Notes (Signed)
Electrophysiology Office Note   Date:  04/21/2020   ID:  Gerald [name], DOB 1940/11/21, MRN [id number]  PCP:  Antony Contras, MD  Cardiologist:  Claiborne Billings Primary Electrophysiologist:  Davina Howlett Meredith Leeds, MD    Chief Complaint: AF   History of Present Illness: Gerald [name] is a 80 y.o. male who is being seen today for the evaluation of AF at the request of [name], [name], [name]. Presenting today for electrophysiology evaluation.  He has a history of nonobstructive coronary artery disease, paroxysmal atrial fibrillation, hypertension, hyperlipidemia, AML status post chemo.  He presented to cardiology clinic 01/09/2020 with chest pain, dizziness, palpitations, and dyspnea on exertion.  Was found to be in atrial fibrillation.  He was scheduled for cardioversion but did go back into sinus rhythm and thus cardioversion was canceled.  He had a left heart catheterization in 2008 which showed a mild to moderate nonobstructive coronary disease in the LAD and RCA.  Today, he denies symptoms of palpitations, chest pain, shortness of breath, orthopnea, PND, lower extremity edema, claudication, dizziness, presyncope, syncope, bleeding, or neurologic sequela. The patient is tolerating medications without difficulties.  He feels well today.  Per his son, he has symptoms of atrial fibrillation once every few months.   Past Medical History:  Diagnosis Date  . A-fib ([place])   . Allergy    seasonal allergies  . 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 CAD (coronary artery disease)    last cath 2008 with noncritical CAD  . GERD (gastroesophageal reflux disease)   . Headache 12/17/2012  . Hyperlipidemia   . Leukemia ([place])   . Other pancytopenia ([place]) 04/09/2013  . 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 Smoking   . Unspecified deficiency anemia 04/16/2013   Past Surgical History:  Procedure Laterality Date  . CARDIAC CATHETERIZATION  01/12/2007   noncritical CAD, no change from cath of  05/20/2005. 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  Marland Kitchen CARDIOVERSION N/A 07/22/2019   Procedure: CARDIOVERSION;  Surgeon: Jerline Pain, MD;  Location: [facility] ENDOSCOPY;  Service: Cardiovascular;  Laterality: N/A;  . NM [place]  01/23/2007   mild perfusion defect seen in the basal inferior and mid inferior regions - c/w attenuation defect. No ischemia present.     Current Outpatient Medications  Medication Sig Dispense Refill  . dronedarone (MULTAQ) 400 MG tablet Take 1 tablet (400 mg total) by mouth 2 (two) times daily with a meal. 60 tablet 3  . metoprolol tartrate (LOPRESSOR) 25 MG tablet Take 1 tablet (25 mg total) by mouth 2 (two) times daily. 180 tablet 3  . rivaroxaban (XARELTO) 20 MG TABS tablet Take 1 tablet (20 mg total) by mouth daily with supper. 90 tablet 3  . VITAMIN D PO Take 2,000 Units by mouth every other day.     . isosorbide mononitrate (IMDUR) 30 MG 24 hr tablet Take 1 tablet (30 mg total) by mouth daily. 30 tablet 3  . nitroGLYCERIN (NITROSTAT) 0.4 MG SL tablet Place 1 tablet (0.4 mg total) under the tongue every 5 (five) minutes as needed for chest pain. 10 tablet 3  . simvastatin (ZOCOR) 40 MG tablet Take 1 tablet (40 mg total) by mouth at bedtime. 90 tablet 3   No current facility-administered medications for this visit.    Allergies:   Patient has no known allergies.   Social History:  The patient  reports that he quit smoking about 14  years ago. His smoking use included cigarettes. He has a 50.00 pack-year smoking history. He has never used smokeless tobacco. He reports that he does not drink alcohol and does not use drugs.   Family History:  The patient's family history includes Healthy in his mother and son; Thyroid cancer in his sister.    ROS:  Please see the history of present illness.   Otherwise, review of systems is positive for none.   All other systems are reviewed and negative.    PHYSICAL  EXAM: VS:  BP 136/70   Pulse (!) 56   Ht 5\' 10"  (1.778 m)   Wt 159 lb (72.1 kg)   SpO2 95%   BMI 22.81 kg/m  , BMI Body mass index is 22.81 kg/m. GEN: Well nourished, well developed, in no acute distress  HEENT: normal  Neck: no JVD, carotid bruits, or masses Cardiac: RRR; no murmurs, rubs, or gallops,no edema  Respiratory:  clear to auscultation bilaterally, normal work of breathing GI: soft, nontender, nondistended, + BS MS: no deformity or atrophy  Skin: warm and dry Neuro:  Strength and sensation are intact Psych: euthymic mood, full affect  EKG:  EKG is ordered today. Personal review of the ekg ordered shows sinus rhythm, rate 56  Recent Labs: 01/09/2020: BUN 20; Creatinine, Ser 1.09; Hemoglobin 13.9; Platelets 130; Potassium 4.4; Sodium 141    Lipid Panel     Component Value Date/Time   CHOL  09/11/2009 0445    121        ATP III CLASSIFICATION:  <200     mg/dL   Desirable  200-239  mg/dL   Borderline High  >=240    mg/dL   High          TRIG 29 09/11/2009 0445   HDL 41 09/11/2009 0445   CHOLHDL 3.0 09/11/2009 0445   VLDL 6 09/11/2009 0445   LDLCALC  09/11/2009 0445    74        Total Cholesterol/HDL:CHD Risk Coronary Heart Disease Risk Table                     Men   Women  1/2 Average Risk   3.4   3.3  Average Risk       5.0   4.4  2 X Average Risk   9.6   7.1  3 X Average Risk  23.4   11.0        Use the calculated Patient Ratio above and the CHD Risk Table to determine the patient's CHD Risk.        ATP III CLASSIFICATION (LDL):  <100     mg/dL   Optimal  100-129  mg/dL   Near or Above                    Optimal  130-159  mg/dL   Borderline  160-189  mg/dL   High  >190     mg/dL   Very High     Wt Readings from Last 3 Encounters:  04/21/20 159 lb (72.1 kg)  01/20/20 159 lb 9.6 oz (72.4 kg)  01/09/20 157 lb (71.2 kg)      Other studies Reviewed: Additional studies/ records that were reviewed today include: TTE 01/14/20  Review of the  above records today demonstrates:  1. Left ventricular ejection fraction, by estimation, is 60 to 65%. The  left ventricle has normal function. The left ventricle has no regional  wall  motion abnormalities. There is mild left ventricular hypertrophy.  Left ventricular diastolic function  could not be evaluated.  2. Right ventricular systolic function is normal. The right ventricular  size is normal.  3. Left atrial size was mildly dilated.  4. The mitral valve is abnormal. Mild mitral valve regurgitation.  5. The aortic valve is tricuspid. Aortic valve regurgitation is mild.  6. Aortic dilatation noted. There is mild dilatation of the ascending  aorta, measuring 39 mm.  7. The inferior vena cava is normal in size with greater than 50%  respiratory variability, suggesting right atrial pressure of 3 mmHg.    ASSESSMENT AND PLAN:  1.  Paroxysmal atrial fibrillation: Currently on metoprolol and Xarelto.  CHA2DS2-VASc of at least 4.  He has nonobstructive coronary artery disease.  Due to that, we Keyshawna Prouse see if he can afford Multaq.  If he cannot, low-dose flecainide would be a reasonable other option.  2.  Nonobstructive coronary artery disease: Currently on metoprolol and Imdur.  Does have chest pain when he is in atrial fibrillation.  Feeling well today.  No changes.  3.  Hypertension: Currently well controlled  4.  Hyperlipidemia: Continue Zocor per primary cardiology.  Case discussed with primary cardiology  Current medicines are reviewed at length with the patient today.   The patient does not have concerns regarding his medicines.  The following changes were made today: Start Multaq  Labs/ tests ordered today include:  Orders Placed This Encounter  Procedures  . EKG 12-Lead     Disposition:   FU with Shanetra Blumenstock 3 months  Signed, Jonathon Tan Meredith Leeds, MD  04/21/2020 11:50 AM     [facility] [address] [place] [address] [phone]  (office) [phone] (fax)

## 2020-04-21 NOTE — Patient Instructions (Addendum)
Medication Instructions:  Your physician has recommended you make the following change in your medication:  1. START Multaq 400 mg twice a day  *If you need a refill on your cardiac medications before your next appointment, please call your pharmacy*   Lab Work: None ordered   Testing/Procedures: None ordered   Follow-Up: At [facility], you and your health needs are our priority.  As part of our continuing mission to provide you with exceptional heart care, we have created designated Provider Care Teams.  These Care Teams include your primary Cardiologist (physician) and Advanced Practice Providers (APPs -  Physician Assistants and Nurse Practitioners) who all work together to provide you with the care you need, when you need it.  Your next appointment:   3 month(s)  The format for your next appointment:   In Person  Provider:   Allegra Lai, MD    Thank you for choosing [facility]!!   Trinidad Curet, RN [phone]   Other Instructions  Dronedarone tablets ?y l thu?c g? DRONEDARONE l thu?c ch?ng lo?n nh?p tim. N gip lm cho nh?p tim c?a qu v? ??u ha. Thu?c ny c th? ???c dng cho nh?ng m?c ?ch khc; hy h?i ng??i cung c?p d?ch v? y t? ho?c d??c s? c?a mnh, n?u qu v? c th?c m?c. (CC) NHN HI?U PH? BI?N: Multaq Ti c?n ph?i bo cho ng??i cung c?p d?ch v? y t? c?a mnh ?i?u g tr??c khi dng thu?c ny? H? c?n bi?t li?u qu v? c b?t k? tnh tr?ng no [place] ?y khng:  suy tim  ti?n s? tim ??p khng ??u  b?nh gan  cc v?n ?? v? gan hay ph?i km v?i vi?c s? d?ng amiodarone trong qu kh?  m?c magnesium trong mu th?p  l??ng kali trong mu th?p  b?nh tim khc  pha?n ??ng b?t th???ng ho??c di? ??ng v??i dronedarone  pha?n ??ng b?t th???ng ho??c di? ??ng v??i ca?c d??c ph?m kha?c  pha?n ??ng b?t th???ng ho??c di? ??ng v??i th??c ph?m, thu?c nhu?m, ho??c ch?t ba?o qua?n  ?ang c thai ho??c ??nh co? thai  ?ang cho con bu? Ti  nn s? d?ng thu?c ny nh? th? no? U?ng thu?c ny v?i m?t ly n??c. Hy lm theo cc h??ng d?n trn h?p thu?c ho?c nhn thu?c. U?ng m?t vin thu?c cng v?i b?a ?n sng v m?t vin thu?c cng v?i b?a ?n t?i. Khng ???c dng thu?c ny nhi?u l?n h?n ? ???c ch? d?n. Khng ???c ng?ng s? d?ng thu?c ny, ngo?i tr? ?ang lm theo l?i khuyn c?a bc s?. D??c s? s? ??a cho qu v? m?t B?n H??ng D?n v? D??c Ph?m (MedGuide) ??c bi?t cho m?i toa thu?c v cho m?i l?n mua thm thu?c ?. Hy b?o ??m ??c k? thng tin ny m?i l?n. Hy bn v?i bc s? nhi khoa c?a qu v? v? vi?c dng thu?c ny ? tr? em. C th? c?n ch?m  ??c bi?t. Qu li?u: N?u qu v? cho r?ng mnh ? dng qu nhi?u thu?c ny, th hy lin l?c v?i trung tm ki?m sot ch?t ??c ho?c phng c?p c?u ngay l?p t?c. L?U : Thu?c ny ch? dnh ring cho qu v?. Khng chia s? thu?c ny v?i nh?ng ng??i khc. N?u ti l? qun m?t li?u th sao? N?u qu v? l? qun m?t li?u thu?c, hy dng li?u thu?c ? ngay khi c th?. N?u h?u nh? ? ??n gi? dng li?u thu?c k? ti?p, th ch? dng li?u thu?c  k? ti?p ?Marland Kitchen Khng ???c dng li?u g?p ?i ho?c dng thm li?u. Nh?ng g c th? t??ng tc v?i thu?c ny? Khng ???c dng thu?c ny cng v?i b?t k? th? no [place] ?y:  th?ch tn (arsenic trioxide)  m?t s? thu?c khng sinh, ch?ng h?n nh? clarithromycin, erythromycin, pentamidine, telithromycin, troleandomycin  m?t s? thu?c dng cho ch?ng tr?m c?m, ch?ng h?n nh? thu?c ch?ng tr?m c?m ba vng  m?t s? thu?c dng ?? tr? cc b?nh nhi?m n?m, ch?ng h?n nh? fluconazole, itraconazole, ketoconazole, posaconazole, voriconazole  m?t s? thu?c dng ?? tr? nh?p tim khng ??u, ch?ng h?n nh? amiodarone, disopyramide, flecainide, ibutilide, quinidine, propafenone, sotalol  m?t s? thu?c dng ?? tr? b?nh s?t rt, ch?ng h?n nh? chloroquine, halofantrine  cisapride  cyclosporine  droperidol  haloperidol  methadone  cc thu?c khc lm ko di kho?ng QT (gy ra nh?p tim b?t th??ng) nh? degarelix,  encorafenib, entrectinib, eribulin, goserelin, lapatinib  pimozide  nefazodone  cc thu?c nhm phenothiazine, ch?ng h?n nh? chlorpromazine, mesoridazine, prochlorperazine, thioridazine  ritonavir  ziprasidone Thu?c ny c?ng c th? t??ng tc v?i cc thu?c Aariz ?y:  cc thu?c dng ?? tr? huy?t p cao, b?nh tim, hay nh?p tim khng ??u, ch?ng h?n nh? diltiazem, metoprolol, propranolol, verapamil  m?t s? thu?c lm h? m?c cholesterol, ch?ng h?n nh? atorvastatin, lovastatin, simvastatin  m?t s? thu?c dng cho cc ch?ng co gi?t, ch?ng h?n nh? carbamazepine, phenobarbital, phenytoin  digoxin  dofetilide  n??c p tri b??i (grapefruit)  rifampicin  sirolimus  cy [facility] John's Wort (c? [facility] John/cy n?c s?i/cy l?nh)  tacrolimus Danh sch ny c th? khng m t? ?? h?t cc t??ng tc c th? x?y ra. Hy ??a cho ng??i cung c?p d?ch v? y t? c?a mnh danh sch t?t c? cc thu?c, th?o d??c, cc thu?c khng c?n toa, ho?c cc ch? ph?m b? sung m qu v? dng. C?ng nn bo cho h? bi?t r?ng qu v? c ht thu?c, u?ng r??u, ho?c c s? d?ng ma ty tri php hay khng. Vi th? c th? t??ng tc v?i thu?c c?a qu v?. Ti c?n ph?i theo di ?i?u g trong khi dng thu?c ny? Tnh tr?ng c?a qu v? s? ???c theo di ch?t ch? khi qu v? b?t ??u tr? li?u l?n ??u tin. Thng th??ng, thu?c ny ???c dng l?n ??u t?i b?nh vi?n ho?c c? s? y t? c gim st khc. M?t khi qu v? ? giai ?o?n tr? li?u duy tr, th hy ?i g?p bc s? ho?c [place] vin y t? ?? theo di ??nh k? s? c?i thi?n c?a qu v?. Do tnh tr?ng s?c kh?e c?a qu v? v do vi?c s? d?ng thu?c ny c m?t s? r?i ro, qu v? nn ?eo vng tay ho?c dy chuy?n c th? ghi ch thng tin y t? c nhn (medical identification), . Hy mang theo th? ghi thng tin y t? c nhn (medical identification) c ghi ch cc chi ti?t v? tnh tr?ng c?a qu v?, cc tn thu?c, v tn bc s? c?a qu v?. Qu v? c th? b? bu?n ng? ho?c chng m?t. Khng ???c li xe, s? d?ng my mc, ho?c lm nh?ng  vi?c c?n ph?i t?nh to cho t?i khi qu v? bi?t ???c thu?c ny ?nh h??ng ln qu v? nh? th? no. Khng ???c ng?i d?y ho?c ??ng d?y nhanh, ??c bi?t l khi qu v? l b?nh nhn l?n tu?i. ?i?u ny lm gi?m nguy c? b? chng m?t ho?c ng?t x?u. Ti c th?  nh?n th?y nh?ng tc d?ng ph? no khi dng thu?c ny? Nh?ng tc d?ng ph? qu v? c?n ph?i bo cho bc s? ho?c chuyn vin y t? cng s?m cng t?t:  cc ph?n ?ng d? ?ng, ch?ng h?n nh? da b? m?n ??, ng?a, n?i my ?ay, s?ng ? m?t, mi, ho?c l??i  kh th?  ho  n??c ti?u s?m mu  tim ??p nhanh ho?c khng ??u  c?m th?y m?t m?i ho?c cc tri?u ch?ng gi?ng cm  phn b?c mu  m?t c?m gic ngon mi?ng, bu?n i  ?au ? vng b?ng trn bn ph?i  tim ??p ch?m  ?au b?ng  s?ng ? chn ho?c m?t c chn  m?t m?i ho?c y?u ?t b?t th??ng  t?ng cn  vng da ho?c m?t Cc tc d?ng ph? khng c?n ph?i ch?m [place] y t? (hy bo cho bc s? ho?c chuyn vin y t?, n?u cc tc d?ng ph? ny ti?p di?n ho?c gy phi?n toi):  bu?n i  i m?a Danh sch ny c th? khng m t? ?? h?t cc tc d?ng ph? c th? x?y ra. Xin g?i t?i bc s? c?a mnh ?? ???c c? v?n chuyn mn v? cc tc d?ng ph?Sander Nephew v? c th? t??ng trnh cc tc d?ng ph? cho FDA theo s? [phone]. Ti nn c?t gi? thu?c c?a mnh ? ?u? ?? ngoi t?m tay tr? em. C?t gi? ? nhi?t ?? phng t? 15 ??n 30 ?? C (57 ??n 84 ?? F). V?t b? t?t c? thu?c ch?a dng Krishawn ngy h?t h?n in trn nhn thu?c ho?c bao thu?c. L?U : ?y l b?n tm t?t. N c th? khng bao hm t?t c? thng tin c th? c. N?u qu v? th?c m?c v? thu?c ny, xin trao ??i v?i bc s?, d??c s?, ho?c ng??i cung c?p d?ch v? y t? c?a mnh.  2021 Elsevier/Gold Standard (2019-07-19 00:00:00)

## 2020-04-23 ENCOUNTER — Ambulatory Visit: Payer: Medicare Other | Admitting: Medical

## 2020-04-23 DIAGNOSIS — Z902 Acquired absence of lung [part of]: Secondary | ICD-10-CM | POA: Diagnosis not present

## 2020-04-23 DIAGNOSIS — Z85118 Personal history of other malignant neoplasm of bronchus and lung: Secondary | ICD-10-CM | POA: Diagnosis not present

## 2020-04-23 DIAGNOSIS — Z08 Encounter for follow-up examination after completed treatment for malignant neoplasm: Secondary | ICD-10-CM | POA: Diagnosis not present

## 2020-04-23 DIAGNOSIS — C3412 Malignant neoplasm of upper lobe, left bronchus or lung: Secondary | ICD-10-CM | POA: Diagnosis not present

## 2020-04-28 DIAGNOSIS — J309 Allergic rhinitis, unspecified: Secondary | ICD-10-CM | POA: Diagnosis not present

## 2020-04-28 DIAGNOSIS — E782 Mixed hyperlipidemia: Secondary | ICD-10-CM | POA: Diagnosis not present

## 2020-04-28 DIAGNOSIS — E559 Vitamin D deficiency, unspecified: Secondary | ICD-10-CM | POA: Diagnosis not present

## 2020-04-28 DIAGNOSIS — D696 Thrombocytopenia, unspecified: Secondary | ICD-10-CM | POA: Diagnosis not present

## 2020-04-28 DIAGNOSIS — D6869 Other thrombophilia: Secondary | ICD-10-CM | POA: Diagnosis not present

## 2020-04-28 DIAGNOSIS — Z7189 Other specified counseling: Secondary | ICD-10-CM | POA: Diagnosis not present

## 2020-04-28 DIAGNOSIS — I4891 Unspecified atrial fibrillation: Secondary | ICD-10-CM | POA: Diagnosis not present

## 2020-04-28 DIAGNOSIS — R7309 Other abnormal glucose: Secondary | ICD-10-CM | POA: Diagnosis not present

## 2020-04-28 DIAGNOSIS — R7303 Prediabetes: Secondary | ICD-10-CM | POA: Diagnosis not present

## 2020-04-28 DIAGNOSIS — C9201 Acute myeloblastic leukemia, in remission: Secondary | ICD-10-CM | POA: Diagnosis not present

## 2020-04-28 DIAGNOSIS — C3490 Malignant neoplasm of unspecified part of unspecified bronchus or lung: Secondary | ICD-10-CM | POA: Diagnosis not present

## 2020-04-28 DIAGNOSIS — M810 Age-related osteoporosis without current pathological fracture: Secondary | ICD-10-CM | POA: Diagnosis not present

## 2020-05-05 NOTE — Progress Notes (Signed)
Cardiology Office Note   Date:  05/06/2020   ID:  [name], [name] 15-Mar-1941, MRN [id number]  PCP:  Gerald Contras, MD  Cardiologist:  Gerald Majestic, MD EP: None  Chief Complaint  Patient presents with  . Follow-up    Atrial fibrillation      History of Present Illness: Gerald [name] is a 80 y.o. male a PMH of non-obstructive CAD, paroxysmal atrial fibrillation, HTN, HLD, SCLC s/p chemo and more recently NSCLC s/p radiation, AML in remission,who presents forfollow-up of his atrial fibrillation.  I evaluated this patient back in 12/2019, at which time he had a recent bout of atrial fibrillation which resulted in chest pain, dizziness, palpitations, and DOE, though by the time he was evaluated in the office, his symptoms had resolved. He was referred to EP for antiarrhythmic consideration vs ablation. He was seen by Dr. Curt [name] with EP 04/21/20 for the evaluation of symptomatic paroxysmal atrial fibrillation and was recommended to start Multaq. He was recommended to follow-up in 3 months. His last echocardiogram 12/2019 showed EF 60-65%, indeterminate LV diastolic function, mild LVH, no RWMA, mild LAE, mild MR/AI, and mild dilation of the ascending aorta (51mm). His last ischemic evaluation was a NST 05/2019 which showed no ischemia. His last cardiac catheterization was in 2008 which showed mild-moderate non-obstructive CAD in the LAD and RCA.   He presents today for 3 month follow-up with his son who assists with interpreting. He has been doing well since his last visit. He has not started the Multaq yet as the pharmacist expressed concerns for myalgias when taking with simvastatin. Reviewed interactions with statins and found that risk was less with crestor. Patient is agreeable to this change. We also discussed taking CoQ 10 to minimize myalgia risk. He has not had any recurrent atrial fibrillation since his last visit. No complaints of chest pain, SOB, DOE, dizziness, lightheadedness,  syncope, LE edema, orthopnea, or PND. He does not monitor his blood pressures at home but have been generally well controlled on other outpatient visits.     Past Medical History:  Diagnosis Date  . A-fib ([place])   . Allergy    seasonal allergies  . 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 CAD (coronary artery disease)    last cath 2008 with noncritical CAD  . GERD (gastroesophageal reflux disease)   . Headache 12/17/2012  . Hyperlipidemia   . Leukemia ([place])   . Other pancytopenia ([place]) 04/09/2013  . 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 Smoking   . Unspecified deficiency anemia 04/16/2013    Past Surgical History:  Procedure Laterality Date  . CARDIAC CATHETERIZATION  01/12/2007   noncritical CAD, no change from cath of 05/20/2005. 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  Marland Kitchen CARDIOVERSION N/A 07/22/2019   Procedure: CARDIOVERSION;  Surgeon: Jerline Pain, MD;  Location: [facility] ENDOSCOPY;  Service: Cardiovascular;  Laterality: N/A;  . NM [place]  01/23/2007   mild perfusion defect seen in the basal inferior and mid inferior regions - c/w attenuation defect. No ischemia present.     Current Outpatient Medications  Medication Sig Dispense Refill  . Coenzyme Q10 (COQ10) 50 MG CAPS Take 50 mg by mouth daily. 90 capsule 3  . isosorbide mononitrate (IMDUR) 30 MG 24 hr tablet Take 1 tablet (30 mg total) by mouth daily. 30 tablet 3  . metoprolol tartrate (LOPRESSOR) 25 MG tablet Take 1 tablet (25 mg total)  by mouth 2 (two) times daily. 180 tablet 3  . rivaroxaban (XARELTO) 20 MG TABS tablet Take 1 tablet (20 mg total) by mouth daily with supper. 90 tablet 3  . rosuvastatin (CRESTOR) 20 MG tablet Take 1 tablet (20 mg total) by mouth daily. 90 tablet 3  . VITAMIN D PO Take 2,000 Units by mouth every other day.     . dronedarone (MULTAQ) 400 MG tablet Take 1 tablet (400 mg total) by mouth 2 (two) times daily with a meal.  (Patient not taking: Reported on 05/06/2020) 60 tablet 3  . nitroGLYCERIN (NITROSTAT) 0.4 MG SL tablet Place 1 tablet (0.4 mg total) under the tongue every 5 (five) minutes as needed for chest pain. 10 tablet 3   No current facility-administered medications for this visit.    Allergies:   Patient has no known allergies.    Social History:  The patient  reports that he quit smoking about 14 years ago. His smoking use included cigarettes. He has a 50.00 pack-year smoking history. He has never used smokeless tobacco. He reports that he does not drink alcohol and does not use drugs.   Family History:  The patient's family history includes Healthy in his mother and son; Thyroid cancer in his sister.    ROS:  Please see the history of present illness.   Otherwise, review of systems are positive for none.   All other systems are reviewed and negative.    PHYSICAL EXAM: VS:  BP 140/70   Pulse (!) 54   Ht 5\' 9"  (1.753 m)   Wt 159 lb (72.1 kg)   SpO2 98%   BMI 23.48 kg/m  , BMI Body mass index is 23.48 kg/m. GEN: Well nourished, well developed, in no acute distress HEENT: sclera anicteric Neck: no JVD, carotid bruits, or masses Cardiac: RRR; no murmurs, rubs, or gallops, no edema  Respiratory:  clear to auscultation bilaterally, normal work of breathing GI: soft, nontender, nondistended, + BS MS: no deformity or atrophy Skin: warm and dry, no rash Neuro:  Strength and sensation are intact Psych: euthymic mood, full affect   EKG:  EKG is not ordered today.    Recent Labs: 01/09/2020: BUN 20; Creatinine, Ser 1.09; Hemoglobin 13.9; Platelets 130; Potassium 4.4; Sodium 141    Lipid Panel    Component Value Date/Time   CHOL  09/11/2009 0445    121        ATP III CLASSIFICATION:  <200     mg/dL   Desirable  200-239  mg/dL   Borderline High  >=240    mg/dL   High          TRIG 29 09/11/2009 0445   HDL 41 09/11/2009 0445   CHOLHDL 3.0 09/11/2009 0445   VLDL 6 09/11/2009 0445    LDLCALC  09/11/2009 0445    74        Total Cholesterol/HDL:CHD Risk Coronary Heart Disease Risk Table                     Men   Women  1/2 Average Risk   3.4   3.3  Average Risk       5.0   4.4  2 X Average Risk   9.6   7.1  3 X Average Risk  23.4   11.0        Use the calculated Patient Ratio above and the CHD Risk Table to determine the patient's CHD Risk.  ATP III CLASSIFICATION (LDL):  <100     mg/dL   Optimal  100-129  mg/dL   Near or Above                    Optimal  130-159  mg/dL   Borderline  160-189  mg/dL   High  >190     mg/dL   Very High      Wt Readings from Last 3 Encounters:  05/06/20 159 lb (72.1 kg)  04/21/20 159 lb (72.1 kg)  01/20/20 159 lb 9.6 oz (72.4 kg)      Other studies Reviewed: Additional studies/ records that were reviewed today include:   Echocardiogram 01/14/20: 1. Left ventricular ejection fraction, by estimation, is 60 to 65%. The  left ventricle has normal function. The left ventricle has no regional  wall motion abnormalities. There is mild left ventricular hypertrophy.  Left ventricular diastolic function  could not be evaluated.  2. Right ventricular systolic function is normal. The right ventricular  size is normal.  3. Left atrial size was mildly dilated.  4. The mitral valve is abnormal. Mild mitral valve regurgitation.  5. The aortic valve is tricuspid. Aortic valve regurgitation is mild.  6. Aortic dilatation noted. There is mild dilatation of the ascending  aorta, measuring 39 mm.  7. The inferior vena cava is normal in size with greater than 50%  respiratory variability, suggesting right atrial pressure of 3 mmHg.   Echocardiogram 05/2019: 1. Left ventricular ejection fraction, by estimation, is 65 to 70%. The  left ventricle has normal function. The left ventricle has no regional  wall motion abnormalities. Left ventricular diastolic parameters are  consistent with Grade I diastolic  dysfunction (impaired  relaxation).  2. Right ventricular systolic function is normal. The right ventricular  size is normal. There is mildly elevated pulmonary artery systolic  pressure.  3. The mitral valve is abnormal. Trivial mitral valve regurgitation.  4. The aortic valve is tricuspid. Aortic valve regurgitation is mild to  moderate. Mild aortic valve sclerosis is present, with no evidence of  aortic valve stenosis.  5. Aortic dilatation noted. There is mild dilatation of the ascending  aorta measuring 40 mm.  6. The inferior vena cava is normal in size with greater than 50%  respiratory variability, suggesting right atrial pressure of 3 mmHg.   NST 05/2019:  Nuclear stress EF: 62%.  The left ventricular ejection fraction is normal (55-65%).  There was no ST segment deviation noted during stress.  The study is normal.  This is a low risk study.     ASSESSMENT AND PLAN:   1. Non-obstructive CAD:Not on aspirin given need for xarelto. Recent echo was reassuring.  - Continue statin - Continue metoprololand imdur  2. Paroxysmal atrial fibrillation:HR is regular on exam. Historically does not tolerate Afib well. He has not missed any doses of xarelto. Recently started on Multaq for his afib - Continue multaq for rhythm control - Continue metoprolol tartrate for rate control - Continue xarelto for stroke ppx  3. HTN:BP 146/70 today - Encouraged patient to monitor his BP over the next couple weeks and notify the office if persistently >130/80. If elevated, would consider addition of low dose amlodipine as bradycardia limits titration of metoprolol.  - Continue metoprolol tatrate  4. HLD:LDL 69 09/2019. Pharmacist expressed concern with concomitant simvastatin and multaq use. Drug interactions reviewed and appears there is less concern with crestor.  - Will transition from simvastatin to crestor 20mg   daily  - Suggested addition of CoQ10 to minimize myalgias  5. 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  The patient does not have concerns regarding medicines.  The following changes have been made:  As above  Labs/ tests ordered today include:  No orders of the defined types were placed in this encounter.    Disposition:   FU with Dr. Claiborne Billings in 6 months  Signed, Abigail Butts, [name]  05/06/2020 9:58 AM

## 2020-05-06 ENCOUNTER — Ambulatory Visit (INDEPENDENT_AMBULATORY_CARE_PROVIDER_SITE_OTHER): Payer: Medicare Other | Admitting: Medical

## 2020-05-06 ENCOUNTER — Encounter: Payer: Self-pay | Admitting: Medical

## 2020-05-06 ENCOUNTER — Other Ambulatory Visit: Payer: Self-pay

## 2020-05-06 VITALS — BP 140/70 | HR 54 | Ht 69.0 in | Wt 159.0 lb

## 2020-05-06 DIAGNOSIS — I480 Paroxysmal atrial fibrillation: Secondary | ICD-10-CM

## 2020-05-06 DIAGNOSIS — E785 Hyperlipidemia, unspecified: Secondary | ICD-10-CM | POA: Diagnosis not present

## 2020-05-06 DIAGNOSIS — I10 Essential (primary) hypertension: Secondary | ICD-10-CM | POA: Diagnosis not present

## 2020-05-06 DIAGNOSIS — I712 Thoracic aortic aneurysm, without rupture, unspecified: Secondary | ICD-10-CM

## 2020-05-06 DIAGNOSIS — I25119 Atherosclerotic heart disease of native coronary artery with unspecified angina pectoris: Secondary | ICD-10-CM

## 2020-05-06 MED ORDER — COQ10 50 MG PO CAPS: 50.0000 mg | ORAL_CAPSULE | Freq: Every day | ORAL | 3 refills | Status: DC

## 2020-05-06 MED ORDER — ROSUVASTATIN CALCIUM 20 MG PO TABS: 20.0000 mg | ORAL_TABLET | Freq: Every day | ORAL | 3 refills | Status: AC

## 2020-05-06 NOTE — Patient Instructions (Addendum)
Medication Instructions:   STOP Simvastatin   START Rosuvastatin (Crestor) 20 mg daily  START COQ-10 50 mg daily  *If you need a refill on your cardiac medications before your next appointment, please call your pharmacy*  Lab Work: NONE ordered at this time of appointment   If you have labs (blood work) drawn today and your tests are completely normal, you will receive your results only by: Marland Kitchen MyChart Message (if you have MyChart) OR . A paper copy in the mail If you have any lab test that is abnormal or we need to change your treatment, we will call you to review the results.  Testing/Procedures: NONE ordered at this time of appointment   Follow-Up: At [facility], you and your health needs are our priority.  As part of our continuing mission to provide you with exceptional heart care, we have created designated Provider Care Teams.  These Care Teams include your primary Cardiologist (physician) and Advanced Practice Providers (APPs -  Physician Assistants and Nurse Practitioners) who all work together to provide you with the care you need, when you need it.  Your next appointment:   6 month(s)  The format for your next appointment:   In Person  Provider:   Shelva Majestic, MD  Other Instructions  Continue to monitor blood pressure. If BP is consistently greater than 130/80 give our office a call.

## 2020-05-07 DIAGNOSIS — I4891 Unspecified atrial fibrillation: Secondary | ICD-10-CM | POA: Diagnosis not present

## 2020-05-07 DIAGNOSIS — Z79899 Other long term (current) drug therapy: Secondary | ICD-10-CM | POA: Diagnosis not present

## 2020-05-07 DIAGNOSIS — Z9221 Personal history of antineoplastic chemotherapy: Secondary | ICD-10-CM | POA: Diagnosis not present

## 2020-05-07 DIAGNOSIS — Z7901 Long term (current) use of anticoagulants: Secondary | ICD-10-CM | POA: Diagnosis not present

## 2020-05-07 DIAGNOSIS — Z8616 Personal history of COVID-19: Secondary | ICD-10-CM | POA: Diagnosis not present

## 2020-05-07 DIAGNOSIS — C9201 Acute myeloblastic leukemia, in remission: Secondary | ICD-10-CM | POA: Diagnosis not present

## 2020-05-07 DIAGNOSIS — C3412 Malignant neoplasm of upper lobe, left bronchus or lung: Secondary | ICD-10-CM | POA: Diagnosis not present

## 2020-05-07 DIAGNOSIS — I480 Paroxysmal atrial fibrillation: Secondary | ICD-10-CM | POA: Diagnosis not present

## 2020-05-07 DIAGNOSIS — E785 Hyperlipidemia, unspecified: Secondary | ICD-10-CM | POA: Diagnosis not present

## 2020-05-07 DIAGNOSIS — D696 Thrombocytopenia, unspecified: Secondary | ICD-10-CM | POA: Diagnosis not present

## 2020-05-12 ENCOUNTER — Other Ambulatory Visit: Payer: Self-pay | Admitting: Cardiology

## 2020-05-12 ENCOUNTER — Other Ambulatory Visit: Payer: Self-pay | Admitting: Cardiovascular Disease

## 2020-05-12 DIAGNOSIS — R079 Chest pain, unspecified: Secondary | ICD-10-CM

## 2020-05-13 MED ORDER — DRONEDARONE HCL 400 MG PO TABS
400.0000 mg | ORAL_TABLET | Freq: Two times a day (BID) | ORAL | 3 refills | Status: DC
Start: 2020-05-13 — End: 2020-12-23

## 2020-05-21 ENCOUNTER — Ambulatory Visit: Payer: Medicare Other | Admitting: Cardiovascular Disease

## 2020-07-06 DIAGNOSIS — M810 Age-related osteoporosis without current pathological fracture: Secondary | ICD-10-CM | POA: Diagnosis not present

## 2020-07-20 ENCOUNTER — Ambulatory Visit: Payer: Medicare Other | Admitting: Cardiology

## 2020-08-30 ENCOUNTER — Encounter (HOSPITAL_COMMUNITY): Payer: Self-pay | Admitting: *Deleted

## 2020-08-30 ENCOUNTER — Other Ambulatory Visit: Payer: Self-pay

## 2020-08-30 ENCOUNTER — Emergency Department (HOSPITAL_COMMUNITY)
Admission: EM | Admit: 2020-08-30 | Discharge: 2020-08-30 | Disposition: A | Discharge status: Home / Self Care | Payer: Medicare Other | Attending: Emergency Medicine | Admitting: Emergency Medicine

## 2020-08-30 ENCOUNTER — Emergency Department (HOSPITAL_COMMUNITY): Payer: Medicare Other

## 2020-08-30 DIAGNOSIS — I2510 Atherosclerotic heart disease of native coronary artery without angina pectoris: Secondary | ICD-10-CM | POA: Diagnosis not present

## 2020-08-30 DIAGNOSIS — Z85118 Personal history of other malignant neoplasm of bronchus and lung: Secondary | ICD-10-CM | POA: Diagnosis not present

## 2020-08-30 DIAGNOSIS — J449 Chronic obstructive pulmonary disease, unspecified: Secondary | ICD-10-CM | POA: Diagnosis not present

## 2020-08-30 DIAGNOSIS — Z7901 Long term (current) use of anticoagulants: Secondary | ICD-10-CM | POA: Diagnosis not present

## 2020-08-30 DIAGNOSIS — I499 Cardiac arrhythmia, unspecified: Secondary | ICD-10-CM | POA: Diagnosis not present

## 2020-08-30 DIAGNOSIS — R000 Tachycardia, unspecified: Secondary | ICD-10-CM | POA: Diagnosis not present

## 2020-08-30 DIAGNOSIS — Z743 Need for continuous supervision: Secondary | ICD-10-CM | POA: Diagnosis not present

## 2020-08-30 DIAGNOSIS — Z87891 Personal history of nicotine dependence: Secondary | ICD-10-CM | POA: Insufficient documentation

## 2020-08-30 DIAGNOSIS — R0602 Shortness of breath: Secondary | ICD-10-CM | POA: Diagnosis not present

## 2020-08-30 DIAGNOSIS — Z79899 Other long term (current) drug therapy: Secondary | ICD-10-CM | POA: Insufficient documentation

## 2020-08-30 DIAGNOSIS — R61 Generalized hyperhidrosis: Secondary | ICD-10-CM | POA: Diagnosis not present

## 2020-08-30 DIAGNOSIS — I4891 Unspecified atrial fibrillation: Secondary | ICD-10-CM | POA: Insufficient documentation

## 2020-08-30 DIAGNOSIS — R0600 Dyspnea, unspecified: Secondary | ICD-10-CM | POA: Diagnosis not present

## 2020-08-30 LAB — COMPREHENSIVE METABOLIC PANEL
ALT: 41 U/L (ref 0–44)
AST: 29 U/L (ref 15–41)
Albumin: 3.3 g/dL — ABNORMAL LOW (ref 3.5–5.0)
Alkaline Phosphatase: 34 U/L — ABNORMAL LOW (ref 38–126)
Anion gap: 10 (ref 5–15)
BUN: 17 mg/dL (ref 8–23)
CO2: 21 mmol/L — ABNORMAL LOW (ref 22–32)
Calcium: 8.3 mg/dL — ABNORMAL LOW (ref 8.9–10.3)
Chloride: 108 mmol/L (ref 98–111)
Creatinine, Ser: 1.14 mg/dL (ref 0.61–1.24)
GFR, Estimated: >60 mL/min (ref >60)
Glucose, Bld: 135 mg/dL — ABNORMAL HIGH (ref 70–99)
Potassium: 3.5 mmol/L (ref 3.5–5.1)
Sodium: 139 mmol/L (ref 135–145)
Total Bilirubin: 0.8 mg/dL (ref 0.3–1.2)
Total Protein: 6.6 g/dL (ref 6.5–8.1)

## 2020-08-30 LAB — CBC WITH DIFFERENTIAL/PLATELET
Abs Immature Granulocytes: 0.02 10*3/uL (ref 0.00–0.07)
Basophils Absolute: 0 10*3/uL (ref 0.0–0.1)
Basophils Relative: 1 %
Eosinophils Absolute: 0.5 10*3/uL (ref 0.0–0.5)
Eosinophils Relative: 8 %
HCT: 39.5 % (ref 39.0–52.0)
Hemoglobin: 13.6 g/dL (ref 13.0–17.0)
Immature Granulocytes: 0 %
Lymphocytes Relative: 28 %
Lymphs Abs: 1.8 10*3/uL (ref 0.7–4.0)
MCH: 34 pg (ref 26.0–34.0)
MCHC: 34.4 g/dL (ref 30.0–36.0)
MCV: 98.8 fL (ref 80.0–100.0)
Monocytes Absolute: 0.8 10*3/uL (ref 0.1–1.0)
Monocytes Relative: 12 %
Neutro Abs: 3.3 10*3/uL (ref 1.7–7.7)
Neutrophils Relative %: 51 %
Platelets: 141 10*3/uL — ABNORMAL LOW (ref 150–400)
RBC: 4 MIL/uL — ABNORMAL LOW (ref 4.22–5.81)
RDW: 13.1 % (ref 11.5–15.5)
WBC: 6.4 10*3/uL (ref 4.0–10.5)
nRBC: 0 % (ref 0.0–0.2)

## 2020-08-30 LAB — TROPONIN I (HIGH SENSITIVITY): Troponin I (High Sensitivity): 8 ng/L (ref <18)

## 2020-08-30 LAB — MAGNESIUM: Magnesium: 1.7 mg/dL (ref 1.7–2.4)

## 2020-08-30 MED ORDER — METOPROLOL TARTRATE 25 MG PO TABS
25.0000 mg | ORAL_TABLET | Freq: Once | ORAL | Status: AC
  Administered 2020-08-30: 25 mg via ORAL
  Filled 2020-08-30: qty 1

## 2020-08-30 NOTE — ED Triage Notes (Signed)
The pt arrived by gems from home   Pt c/o sob and was found toim be in af.  Hx of the same   No recent af

## 2020-08-30 NOTE — ED Provider Notes (Signed)
[facility] EMERGENCY DEPARTMENT Provider Note   CSN: [id number] Arrival date & time: 08/30/20  1927     History Chief Complaint  Patient presents with  . Shortness of Breath    Gerald [name] is a 80 y.o. male.  The history is provided by the patient, the EMS personnel, a relative and medical records.  Shortness of Breath  Gerald [name] is a 80 y.o. male who presents to the Emergency Department complaining of chest pain. He presents the emergency department by EMS complaining of chest pain and rapid heartbeat. Symptoms started at 3 AM yesterday. He developed chest pain described as a pressure/heaviness with rapid heart rate. He took three nitroglycerin with improvement in his symptoms but he did have some ongoing pain when he awoke. This afternoon he developed recurrent rapid heart rate and EMS was called. He received 10 mg of Cardizem prior to ED arrival with improvement in his heart rate but he has ongoing chest heaviness. They reports of fevers, cough, shortness of breath, abdominal pain, nausea, vomiting, diarrhea. He currently takes metoprolol as well Xarelto for history of a fib.    Past Medical History:  Diagnosis Date  . A-fib ([place])   . Allergy    seasonal allergies  . 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 CAD (coronary artery disease)    last cath 2008 with noncritical CAD  . GERD (gastroesophageal reflux disease)   . Headache 12/17/2012  . Hyperlipidemia   . Leukemia ([place])   . Other pancytopenia ([place]) 04/09/2013  . 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 Smoking   . Unspecified deficiency anemia 04/16/2013    Patient Active Problem List   Diagnosis Date Noted  . Chest pain with moderate risk of acute coronary syndrome 11/08/2017  . CAD (coronary artery disease) 11/08/2017  . Paroxysmal atrial fibrillation ([place]) 04/10/2014  . Sinus bradycardia 04/10/2014  . 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 Unspecified deficiency anemia  04/16/2013  . Other pancytopenia ([place]) 04/09/2013  . Chronic anticoagulation 01/09/2013  . Headache 12/17/2012  . Hyperlipidemia   . Smoking   . Small cell lung cancer ([place]) 12/27/2010    Past Surgical History:  Procedure Laterality Date  . CARDIAC CATHETERIZATION  01/12/2007   noncritical CAD, no change from cath of 05/20/2005. 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  Marland Kitchen CARDIOVERSION N/A 07/22/2019   Procedure: CARDIOVERSION;  Surgeon: Jerline Pain, MD;  Location: [facility] ENDOSCOPY;  Service: Cardiovascular;  Laterality: N/A;  . NM [place]  01/23/2007   mild perfusion defect seen in the basal inferior and mid inferior regions - c/w attenuation defect. No ischemia present.       Family History  Problem Relation Age of Onset  . Healthy Mother   . Thyroid cancer Sister   . Healthy Son     Social History   Tobacco Use  . Smoking status: Former Smoker    Packs/day: 1.00    Years: 50.00    Pack years: 50.00    Types: Cigarettes    Quit date: 03/28/2006    Years since quitting: 14.4  . Smokeless tobacco: Never Used  Substance Use Topics  . Alcohol use: No  . Drug use: No    Home Medications Prior to Admission medications   Medication Sig Start Date End Date Taking? Authorizing Provider  Coenzyme Q10 (COQ10) 50 MG CAPS Take 50 mg by mouth daily. 05/06/20  [name], Daleen Snook M., [name]  dronedarone (MULTAQ) 400 MG tablet Take 1 tablet (400 mg total) by mouth 2 (two) times daily with a meal. 05/13/20   Camnitz, Ocie Doyne, MD  isosorbide mononitrate (IMDUR) 30 MG 24 hr tablet Take 1 tablet (30 mg total) by mouth daily. 01/09/20 04/08/20  [name], Lorelee Cover., [name]  metoprolol tartrate (LOPRESSOR) 25 MG tablet TAKE 1 TABLET BY MOUTH TWICE A DAY 05/12/20   Troy Sine, MD  nitroGLYCERIN (NITROSTAT) 0.4 MG SL tablet PLACE 1 TABLET UNDER THE TONGUE EVERY 5 (FIVE) MINUTES AS NEEDED FOR CHEST PAIN. 05/12/20   Troy Sine, MD   rosuvastatin (CRESTOR) 20 MG tablet Take 1 tablet (20 mg total) by mouth daily. 05/06/20 08/04/20  [name], Lorelee Cover., [name]  VITAMIN D PO Take 2,000 Units by mouth every other day.     [provider]  XARELTO 20 MG TABS tablet TAKE 1 TABLET (20 MG TOTAL) BY MOUTH DAILY WITH SUPPER. 05/12/20   Troy Sine, MD    Allergies    Patient has no known allergies.  Review of Systems   Review of Systems  Respiratory: Positive for shortness of breath.   All other systems reviewed and are negative.   Physical Exam Updated Vital Signs BP 106/69   Pulse 87   Temp 97.6 F (36.4 C)   Resp 20   Ht 5\' 9"  (1.753 m)   Wt 72.1 kg   SpO2 96%   BMI 23.47 kg/m   Physical Exam Vitals and nursing note reviewed.  Constitutional:      Appearance: He is well-developed.  HENT:     Head: Normocephalic and atraumatic.  Cardiovascular:     Rate and Rhythm: Normal rate and regular rhythm.     Heart sounds: No murmur heard.   Pulmonary:     Effort: Pulmonary effort is normal. No respiratory distress.     Breath sounds: Normal breath sounds.  Abdominal:     Palpations: Abdomen is soft.     Tenderness: There is no abdominal tenderness. There is no guarding or rebound.  Musculoskeletal:        General: No swelling or tenderness.  Skin:    General: Skin is warm and dry.  Neurological:     Mental Status: He is alert and oriented to person, place, and time.  Psychiatric:        Behavior: Behavior normal.     ED Results / Procedures / Treatments   Labs (all labs ordered are listed, but only abnormal results are displayed) Labs Reviewed  COMPREHENSIVE METABOLIC PANEL - Abnormal; Notable for the following components:      Result Value   CO2 21 (*)    Glucose, Bld 135 (*)    Calcium 8.3 (*)    Albumin 3.3 (*)    Alkaline Phosphatase 34 (*)    All other components within normal limits  CBC WITH DIFFERENTIAL/PLATELET - Abnormal; Notable for the following components:   RBC 4.00 (*)     Platelets 141 (*)    All other components within normal limits  MAGNESIUM  TROPONIN I (HIGH SENSITIVITY)    EKG EKG Interpretation  Date/Time:  Sunday August 30 2020 19:57:39 EDT Ventricular Rate:  94 PR Interval:    QRS Duration: 104 QT Interval:  369 QTC Calculation: 462 R Axis:   74 Text Interpretation: Atrial fibrillation LVH with secondary repolarization abnormality Confirmed by Quintella Reichert [phone]) on 08/30/2020 7:58:42 PM   Radiology DG Chest [facility]  1 View  Result Date: 08/30/2020 CLINICAL DATA:  Dyspnea, atrial fibrillation EXAM: PORTABLE CHEST 1 VIEW COMPARISON:  07/14/2019 FINDINGS: The lungs are stably mildly hyperinflated. Parenchymal scarring within the left upper lung zone is stable in keeping with treated malignancy. Benign calcified granuloma are seen at the right lung base. No new focal pulmonary nodule or infiltrate. No pneumothorax or pleural effusion. Cardiac size is within normal limits. Pulmonary vascularity is normal. No acute bone abnormality. IMPRESSION: No active disease. COPD. Stable scarring within the left upper lobe in keeping with treated malignancy. Electronically Signed   By: Fidela Salisbury MD   On: 08/30/2020 20:48    Procedures Procedures   Medications Ordered in ED Medications  metoprolol tartrate (LOPRESSOR) tablet 25 mg (25 mg Oral Given 08/30/20 2301)    ED Course  I have reviewed the triage vital signs and the nursing notes.  Pertinent labs & imaging results that were available during my care of the patient were reviewed by me and considered in my medical decision making (see chart for details).    MDM Rules/Calculators/A&P                         patient with history of a fib here for evaluation of palpitations, chest pressure since yesterday. Patient in a fib with RVR for EMS. He was treated with Cardizem prior to ED arrival with rate control after Cardizem administration. On reassessment with continued controlled rate his symptoms have  resolved. No evidence of acute CHF. Presentation is not consistent with PE, ACS. Patient was given his home dose of metoprolol. Discussed with on-call cardiologist - will restart the patient's Multaq, which she has not been taking since being prescribed this in January. Discussed with patient and son importance of compliance to this medication as well as close cardiology follow-up and return precautions.   CHA2DS2/VAS Stroke Risk Points  Current as of 23 minutes ago     4 >= 2 Points: High Risk  1 - 1.99 Points: Medium Risk  0 Points: Low Risk    No Change      Details    This score determines the patient's risk of having a stroke if the  patient has atrial fibrillation.       Points Metrics  0 Has Congestive Heart Failure:  No    Current as of 23 minutes ago  1 Has Vascular Disease:  Yes    Current as of 23 minutes ago  1 Has Hypertension:  Yes    Current as of 23 minutes ago  2 Age:  3    Current as of 23 minutes ago  0 Has Diabetes:  No    Current as of 23 minutes ago  0 Had Stroke:  No  Had TIA:  No  Had Thromboembolism:  No    Current as of 23 minutes ago  0 Male:  No    Current as of 23 minutes ago             Final Clinical Impression(s) / ED Diagnoses Final diagnoses:  Atrial fibrillation, unspecified type [facility])    Rx / [place] Orders ED Discharge Orders    None       Quintella Reichert, MD 08/30/20 2340

## 2020-08-30 NOTE — ED Notes (Signed)
The pt is c/o chest pressure no  Obvious sob

## 2020-09-15 ENCOUNTER — Encounter: Payer: Self-pay | Admitting: Cardiology

## 2020-10-26 DIAGNOSIS — D696 Thrombocytopenia, unspecified: Secondary | ICD-10-CM | POA: Diagnosis not present

## 2020-10-26 DIAGNOSIS — I4891 Unspecified atrial fibrillation: Secondary | ICD-10-CM | POA: Diagnosis not present

## 2020-10-26 DIAGNOSIS — M791 Myalgia, unspecified site: Secondary | ICD-10-CM | POA: Diagnosis not present

## 2020-10-26 DIAGNOSIS — M810 Age-related osteoporosis without current pathological fracture: Secondary | ICD-10-CM | POA: Diagnosis not present

## 2020-10-26 DIAGNOSIS — Z0000 Encounter for general adult medical examination without abnormal findings: Secondary | ICD-10-CM | POA: Diagnosis not present

## 2020-10-26 DIAGNOSIS — D6869 Other thrombophilia: Secondary | ICD-10-CM | POA: Diagnosis not present

## 2020-10-26 DIAGNOSIS — J309 Allergic rhinitis, unspecified: Secondary | ICD-10-CM | POA: Diagnosis not present

## 2020-10-26 DIAGNOSIS — R7303 Prediabetes: Secondary | ICD-10-CM | POA: Diagnosis not present

## 2020-10-26 DIAGNOSIS — Z1389 Encounter for screening for other disorder: Secondary | ICD-10-CM | POA: Diagnosis not present

## 2020-10-26 DIAGNOSIS — E782 Mixed hyperlipidemia: Secondary | ICD-10-CM | POA: Diagnosis not present

## 2020-10-26 DIAGNOSIS — E559 Vitamin D deficiency, unspecified: Secondary | ICD-10-CM | POA: Diagnosis not present

## 2020-10-27 DIAGNOSIS — C9201 Acute myeloblastic leukemia, in remission: Secondary | ICD-10-CM | POA: Diagnosis not present

## 2020-10-27 DIAGNOSIS — C3412 Malignant neoplasm of upper lobe, left bronchus or lung: Secondary | ICD-10-CM | POA: Diagnosis not present

## 2020-11-23 ENCOUNTER — Telehealth: Payer: Self-pay | Admitting: Cardiovascular Disease

## 2020-11-23 NOTE — Telephone Encounter (Signed)
Called and spoke to patient's daughter. She states that she picked up the patient's refill for metoprolol and took it to the patient and he made her aware that he has not taken the metoprolol in 3 months. She states that the patient has continued to take all of his other medications as prescribed including the multaq. She states that his BP has been 125/82 and HR 56 off of the metoprolol. The patient is completely asymptomatic and daughter is wanting to know if he can remain off of the metoprolol. Instructed for the patient to remain off of metoprolol for now given HR 56 without it and that I would forward to [place] for review. Daughter verbalized understanding and thanked me for the call.

## 2020-11-23 NOTE — Telephone Encounter (Signed)
Pt c/o medication issue:  1. Name of Medication: metoprolol tartrate (LOPRESSOR) 25 MG tablet  2. How are you currently taking this medication (dosage and times per day)? TAKE 1 TABLET BY MOUTH TWICE A DAY  3. Are you having a reaction (difficulty breathing--STAT)? No   4. What is your medication issue? Pt has stopped taking this medication since starting dronedarone (MULTAQ) 400 MG tablet.. pt would like to know if this is ok or if he should continue taking both medications... please advise

## 2020-11-24 NOTE — Telephone Encounter (Signed)
Called and spoke to patient's daughter and let her know that the patient may remain off of the metoprolol. She verbalized understanding and thanked me for the call. Medication removed from med list.

## 2020-12-14 ENCOUNTER — Encounter: Payer: Self-pay | Admitting: Cardiology

## 2020-12-14 ENCOUNTER — Other Ambulatory Visit: Payer: Self-pay

## 2020-12-14 ENCOUNTER — Ambulatory Visit (INDEPENDENT_AMBULATORY_CARE_PROVIDER_SITE_OTHER): Payer: Medicare Other | Admitting: Cardiology

## 2020-12-14 VITALS — BP 120/68 | HR 57 | Ht 72.0 in | Wt 159.2 lb

## 2020-12-14 DIAGNOSIS — I480 Paroxysmal atrial fibrillation: Secondary | ICD-10-CM | POA: Diagnosis not present

## 2020-12-14 NOTE — Patient Instructions (Signed)
Medication Instructions:  Your physician has recommended you make the following change in your medication:  HOLD Multaq for next several days... please let us know by end of the week or next week if rash resolves.  Call [place], [place]  *If you need a refill on your cardiac medications before your next appointment, please call your pharmacy*   Lab Work: None ordered   Testing/Procedures: None ordered   Follow-Up: At [facility], you and your health needs are our priority.  As part of our continuing mission to provide you with exceptional heart care, we have created designated Provider Care Teams.  These Care Teams include your primary Cardiologist (physician) and Advanced Practice Providers (APPs -  Physician Assistants and Nurse Practitioners) who all work together to provide you with the care you need, when you need it.  Your next appointment:   6 month(s)  The format for your next appointment:   In Person  Provider:   You will see one of the following Advanced Practice Providers on your designated Care Team:   Tommye Standard, [place] Legrand Como "Jonni Sanger" Chalmers Cater, [place]    Thank you for choosing [facility]!!   Trinidad Curet, RN [phone]

## 2020-12-14 NOTE — Progress Notes (Signed)
Electrophysiology Office Note   Date:  12/14/2020   ID:  Gerald [name], DOB 07/09/1940, MRN [id number]  PCP:  Antony Contras, MD  Cardiologist:  Claiborne Billings Primary Electrophysiologist:  Baer Hinton Meredith Leeds, MD    Chief Complaint: AF   History of Present Illness: Gerald [name] is a 80 y.o. male who is being seen today for the evaluation of AF at the request of Antony Contras, MD. Presenting today for electrophysiology evaluation.  He has a history significant for nonobstructive coronary artery disease, paroxysmal atrial fibrillation, hypertension, hyperlipidemia, AML status post chemo.  He presented to cardiology clinic 01/09/2020 with chest pains, palpitations, dyspnea on exertion.  He was found to be in atrial fibrillation.  He was initially scheduled to undergo cardioversion but he went back into sinus rhythm and thus cardioversion was canceled.  A left heart catheterization 2008 that showed mild to moderate nonobstructive coronary disease in the LAD and RCA.  He is now on Multaq.  Today, denies symptoms of palpitations, chest pain, shortness of breath, orthopnea, PND, lower extremity edema, claudication, dizziness, presyncope, syncope, bleeding, or neurologic sequela. The patient is tolerating medications without difficulties.  Since being seen he has done well.  He had 1 episode of atrial fibrillation in June.  At that point, he decided to start Multaq.  He has had no further episodes of atrial fibrillation since starting Multaq.  Unfortunately, he has developed a rash that started a few weeks after the Multaq started.  There are a few spots on his torso that have been itching since that time.  He has not tried any creams or ointments for the rash.   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   CARDIOVERSION N/A 07/22/2019   Procedure: CARDIOVERSION;  Surgeon: Jerline Pain, MD;  Location: [facility] ENDOSCOPY;  Service: Cardiovascular;  Laterality: N/A;   NM MYOCAR [place]  01/23/2007   mild perfusion defect seen in the basal inferior and mid inferior regions - c/w attenuation defect. No ischemia present.     Current Outpatient Medications  Medication Sig Dispense Refill   Coenzyme Q10 (COQ10) 50 MG CAPS Take 50 mg by mouth daily. 90 capsule 3   dronedarone (MULTAQ) 400 MG tablet Take 1 tablet (400 mg total) by mouth 2 (two) times daily with a meal. 180 tablet 3   isosorbide mononitrate (IMDUR) 30 MG 24 hr tablet Take 1 tablet (30 mg total) by mouth daily. 30 tablet 3   nitroGLYCERIN (NITROSTAT) 0.4 MG SL tablet PLACE 1 TABLET UNDER THE TONGUE EVERY 5 (FIVE) MINUTES AS NEEDED FOR CHEST PAIN. 25 tablet 3   rosuvastatin (CRESTOR) 20 MG tablet Take 1 tablet (20 mg total) by mouth daily. 90 tablet 3   VITAMIN D PO Take 2,000 Units by mouth every other day.  XARELTO 20 MG TABS tablet TAKE 1 TABLET (20 MG TOTAL) BY MOUTH DAILY WITH SUPPER. 90 tablet 3   No current facility-administered medications for this visit.    Allergies:   Patient has no known allergies.   Social History:  The patient  reports that he quit smoking about 14 years ago. His smoking use included cigarettes. He has a 50.00 pack-year smoking history. He has never used smokeless tobacco. He reports that he does not drink alcohol and does not use drugs.   Family History:  The patient's family  history includes Healthy in his mother and son; Thyroid cancer in his sister.   ROS:  Please see the history of present illness.   Otherwise, review of systems is positive for none.   All other systems are reviewed and negative.   PHYSICAL EXAM: VS:  BP 120/68   Pulse (!) 57   Ht 6' (1.829 m)   Wt 159 lb 3.2 oz (72.2 kg)   SpO2 98%   BMI 21.59 kg/m  , BMI Body mass index is 21.59 kg/m. GEN: Well nourished, well developed, in no acute distress  HEENT: normal  Neck: no JVD, carotid bruits, or masses Cardiac: RRR; no murmurs, rubs, or gallops,no edema  Respiratory:  clear to auscultation bilaterally, normal work of breathing GI: soft, nontender, nondistended, + BS MS: no deformity or atrophy  Skin: warm and dry Neuro:  Strength and sensation are intact Psych: euthymic mood, full affect  EKG:  EKG is ordered today. Personal review of the ekg ordered shows sinus rhythm, rate 57  Recent Labs: 08/30/2020: ALT 41; BUN 17; Creatinine, Ser 1.14; Hemoglobin 13.6; Magnesium 1.7; Platelets 141; Potassium 3.5; Sodium 139    Lipid Panel     Component Value Date/Time   CHOL  09/11/2009 0445    121        ATP III CLASSIFICATION:  <200     mg/dL   Desirable  200-239  mg/dL   Borderline High  >=240    mg/dL   High          TRIG 29 09/11/2009 0445   HDL 41 09/11/2009 0445   CHOLHDL 3.0 09/11/2009 0445   VLDL 6 09/11/2009 0445   LDLCALC  09/11/2009 0445    74        Total Cholesterol/HDL:CHD Risk Coronary Heart Disease Risk Table                     Men   Women  1/2 Average Risk   3.4   3.3  Average Risk       5.0   4.4  2 X Average Risk   9.6   7.1  3 X Average Risk  23.4   11.0        Use the calculated Patient Ratio above and the CHD Risk Table to determine the patient's CHD Risk.        ATP III CLASSIFICATION (LDL):  <100     mg/dL   Optimal  100-129  mg/dL   Near or Above                    Optimal  130-159  mg/dL   Borderline  160-189  mg/dL   High  >190     mg/dL    Very High     Wt Readings from Last 3 Encounters:  12/14/20 159 lb 3.2 oz (72.2 kg)  08/30/20 158 lb 15.2 oz (72.1 kg)  05/06/20 159 lb (72.1 kg)      Other studies Reviewed: Additional studies/ records that were reviewed today include: TTE 01/14/20  Review of the above records today demonstrates:   1. Left ventricular ejection fraction, by estimation, is 60 to 65%. The  left ventricle has normal function. The left ventricle has no regional  wall motion abnormalities. There is mild left ventricular hypertrophy.  Left ventricular diastolic function  could not be evaluated.   2. Right ventricular systolic function is normal. The right ventricular  size is normal.   3. Left atrial size was mildly dilated.   4. The mitral valve is abnormal. Mild mitral valve regurgitation.   5. The aortic valve is tricuspid. Aortic valve regurgitation is mild.   6. Aortic dilatation noted. There is mild dilatation of the ascending  aorta, measuring 39 mm.   7. The inferior vena cava is normal in size with greater than 50%  respiratory variability, suggesting right atrial pressure of 3 mmHg.    ASSESSMENT AND PLAN:  Paroxysmal atrial fibrillation: Currently on Xarelto 20 mg daily, Multaq 400 mg twice daily.  CHA2DS2-VASc of at least 4.  He is remained in normal rhythm.  Unfortunately he developed an itchy rash that started a few weeks after starting Multaq.  He is concerned that it could be the Multaq that is causing the rash.  He Larkin Morelos hold Multaq for 1 week.  He Malayiah Mcbrayer not put any creams or ointments on the rash.  We Dalyce Renne speak with him in a week and if the rash is not improved, we Laithan Conchas likely restart Multaq.  If it is improving Amritpal Shropshire need another antiarrhythmic. Nonobstructive coronary artery disease: Currently on Imdur 30 mg.  No current chest pain.  Continue per primary cardiology. Hypertension: Currently well controlled Hyperlipidemia: Continued Crestor 20 mg per primary cardiology.   Current  medicines are reviewed at length with the patient today.   The patient does not have concerns regarding his medicines.  The following changes were made today: None  Labs/ tests ordered today include:  Orders Placed This Encounter  Procedures   EKG 12-Lead      Disposition:   FU with Celina Shiley 6 months  Signed, Adonica Fukushima Meredith Leeds, MD  12/14/2020 9:02 AM     [address] [place] [address] [phone] (office) [phone] (fax)

## 2020-12-22 ENCOUNTER — Telehealth: Payer: Self-pay | Admitting: Cardiology

## 2020-12-22 NOTE — Telephone Encounter (Signed)
New message   Pt c/o medication issue:  1. Name of Medication: multaq  2. How are you currently taking this medication (dosage and times per day)? 400 mg  3. Are you having a reaction (difficulty breathing--STAT)? Pt was having a reaction and developing a rash from medication. Pt daughter said that they were told to stop the medication and to call back in a week   4. What is your medication issue? Pt daughter states that pt rash is improving not taking medication

## 2020-12-23 NOTE — Telephone Encounter (Signed)
Left message to call back  

## 2020-12-23 NOTE — Telephone Encounter (Signed)
Foolow up:     Patient returning a call back.

## 2020-12-23 NOTE — Telephone Encounter (Signed)
Spoke to dtr. She confirms rash improved/gone once Multaq stopped. Aware will forward to MD for confirmation to start Flecainide 50 mg BID.....  Dtr understands I will call back once approved/other recommendation. She asks that if she doesn't answer to leave detailed message and send Rx to [facility]

## 2020-12-24 MED ORDER — FLECAINIDE ACETATE 50 MG PO TABS: 50.0000 mg | ORAL_TABLET | Freq: Two times a day (BID) | ORAL | 3 refills | Status: DC

## 2020-12-24 NOTE — Telephone Encounter (Signed)
Pt has not taken Multaq in almost 2 weeks. Dtr aware to have pt start Flecainide 50 mg BID. Aware I will send information via mychart. Dtr verbalized understanding and agreeable to plan.

## 2020-12-24 NOTE — Telephone Encounter (Signed)
Constance Haw, MD  You Yesterday (1:08 PM)   Ok to start flecainide.

## 2020-12-24 NOTE — Telephone Encounter (Signed)
  Pt's daughter calling back to f/u

## 2020-12-28 DIAGNOSIS — Z23 Encounter for immunization: Secondary | ICD-10-CM | POA: Diagnosis not present

## 2021-01-21 DIAGNOSIS — M810 Age-related osteoporosis without current pathological fracture: Secondary | ICD-10-CM | POA: Diagnosis not present

## 2021-01-21 DIAGNOSIS — I700 Atherosclerosis of aorta: Secondary | ICD-10-CM | POA: Diagnosis not present

## 2021-01-21 DIAGNOSIS — L299 Pruritus, unspecified: Secondary | ICD-10-CM | POA: Diagnosis not present

## 2021-03-22 ENCOUNTER — Other Ambulatory Visit: Payer: Self-pay | Admitting: Cardiology

## 2021-04-29 DIAGNOSIS — D6869 Other thrombophilia: Secondary | ICD-10-CM | POA: Diagnosis not present

## 2021-04-29 DIAGNOSIS — I4891 Unspecified atrial fibrillation: Secondary | ICD-10-CM | POA: Diagnosis not present

## 2021-04-29 DIAGNOSIS — E559 Vitamin D deficiency, unspecified: Secondary | ICD-10-CM | POA: Diagnosis not present

## 2021-04-29 DIAGNOSIS — R7303 Prediabetes: Secondary | ICD-10-CM | POA: Diagnosis not present

## 2021-04-29 DIAGNOSIS — I25119 Atherosclerotic heart disease of native coronary artery with unspecified angina pectoris: Secondary | ICD-10-CM | POA: Diagnosis not present

## 2021-04-29 DIAGNOSIS — D696 Thrombocytopenia, unspecified: Secondary | ICD-10-CM | POA: Diagnosis not present

## 2021-04-29 DIAGNOSIS — C3490 Malignant neoplasm of unspecified part of unspecified bronchus or lung: Secondary | ICD-10-CM | POA: Diagnosis not present

## 2021-04-29 DIAGNOSIS — M810 Age-related osteoporosis without current pathological fracture: Secondary | ICD-10-CM | POA: Diagnosis not present

## 2021-04-29 DIAGNOSIS — J309 Allergic rhinitis, unspecified: Secondary | ICD-10-CM | POA: Diagnosis not present

## 2021-04-29 DIAGNOSIS — C9201 Acute myeloblastic leukemia, in remission: Secondary | ICD-10-CM | POA: Diagnosis not present

## 2021-04-29 DIAGNOSIS — E782 Mixed hyperlipidemia: Secondary | ICD-10-CM | POA: Diagnosis not present

## 2021-05-06 DIAGNOSIS — E785 Hyperlipidemia, unspecified: Secondary | ICD-10-CM | POA: Diagnosis not present

## 2021-05-06 DIAGNOSIS — Z7901 Long term (current) use of anticoagulants: Secondary | ICD-10-CM | POA: Diagnosis not present

## 2021-05-06 DIAGNOSIS — Z85118 Personal history of other malignant neoplasm of bronchus and lung: Secondary | ICD-10-CM | POA: Diagnosis not present

## 2021-05-06 DIAGNOSIS — Z79899 Other long term (current) drug therapy: Secondary | ICD-10-CM | POA: Diagnosis not present

## 2021-05-06 DIAGNOSIS — I4891 Unspecified atrial fibrillation: Secondary | ICD-10-CM | POA: Diagnosis not present

## 2021-05-06 DIAGNOSIS — C9201 Acute myeloblastic leukemia, in remission: Secondary | ICD-10-CM | POA: Diagnosis not present

## 2021-05-06 DIAGNOSIS — D649 Anemia, unspecified: Secondary | ICD-10-CM | POA: Diagnosis not present

## 2021-05-06 DIAGNOSIS — D539 Nutritional anemia, unspecified: Secondary | ICD-10-CM | POA: Diagnosis not present

## 2021-05-06 DIAGNOSIS — Z8616 Personal history of COVID-19: Secondary | ICD-10-CM | POA: Diagnosis not present

## 2021-05-08 ENCOUNTER — Other Ambulatory Visit: Payer: Self-pay | Admitting: Cardiovascular Disease

## 2021-05-10 NOTE — Telephone Encounter (Signed)
Prescription refill request for Xarelto received.  Indication:Afib Last office visit:9/22 Weight:72.2 kg Age:81 Scr:1.1 CrCl:52.87 ml/min  Prescription refilled

## 2021-05-13 DIAGNOSIS — C3490 Malignant neoplasm of unspecified part of unspecified bronchus or lung: Secondary | ICD-10-CM | POA: Diagnosis not present

## 2021-05-13 DIAGNOSIS — R918 Other nonspecific abnormal finding of lung field: Secondary | ICD-10-CM | POA: Diagnosis not present

## 2021-05-13 DIAGNOSIS — C3412 Malignant neoplasm of upper lobe, left bronchus or lung: Secondary | ICD-10-CM | POA: Diagnosis not present

## 2021-05-13 DIAGNOSIS — C9201 Acute myeloblastic leukemia, in remission: Secondary | ICD-10-CM | POA: Diagnosis not present

## 2021-05-27 NOTE — Progress Notes (Signed)
? ?PCP:  Antony Contras, MD ?Primary Cardiologist: Shelva Majestic, MD ?Electrophysiologist: Will Meredith Leeds, MD  ? ?Gerald [name] is a 81 y.o. male seen today for Will Meredith Leeds, MD for routine electrophysiology followup.  Since last being seen in our clinic the patient reports doing OK. No more itching since off multaq.  he denies chest pain, palpitations, dyspnea, PND, orthopnea, nausea, vomiting, dizziness, syncope, edema, weight gain, or early satiety. He has knee issues which would currently prohibit him from walking on a treadmill.  He denies chest discomfort or feelings of breakthrough AF.  ? ?Past Medical History:  ?Diagnosis Date  ? A-fib ([place])   ? Allergy   ? seasonal allergies  ? 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 CAD (coronary artery disease)   ? last cath 2008 with noncritical CAD  ? GERD (gastroesophageal reflux disease)   ? Headache 12/17/2012  ? Hyperlipidemia   ? Leukemia ([place])   ? Other pancytopenia ([place]) 04/09/2013  ? 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 Smoking   ? Unspecified deficiency anemia 04/16/2013  ? ?Past Surgical History:  ?Procedure Laterality Date  ? CARDIAC CATHETERIZATION  01/12/2007  ? noncritical CAD, no change from cath of 05/20/2005. 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  ? CARDIOVERSION N/A 07/22/2019  ? Procedure: CARDIOVERSION;  Surgeon: Jerline Pain, MD;  Location: [facility] ENDOSCOPY;  Service: Cardiovascular;  Laterality: N/A;  ? NM MYOCAR [place]  01/23/2007  ? mild perfusion defect seen in the basal inferior and mid inferior regions - c/w attenuation defect. No ischemia present.  ? ? ?Current Outpatient Medications  ?Medication Sig Dispense Refill  ? flecainide (TAMBOCOR) 50 MG tablet TAKE 1 TABLET BY MOUTH TWICE A DAY 180 tablet 2  ? nitroGLYCERIN (NITROSTAT) 0.4 MG SL tablet PLACE 1 TABLET UNDER THE TONGUE EVERY 5 (FIVE) MINUTES AS NEEDED FOR CHEST PAIN. 25 tablet 3  ? rosuvastatin (CRESTOR) 20 MG  tablet Take 1 tablet (20 mg total) by mouth daily. 90 tablet 3  ? VITAMIN D PO Take 2,000 Units by mouth every other day.     ? XARELTO 20 MG TABS tablet TAKE 1 TABLET BY MOUTH DAILY WITH SUPPER. 90 tablet 3  ? ?No current facility-administered medications for this visit.  ? ? ?No Known Allergies ? ?Social History  ? ?Socioeconomic History  ? Marital status: Married  ?  Spouse name: Not on file  ? Number of children: Not on file  ? Years of education: Not on file  ? Highest education level: Not on file  ?Occupational History  ? Not on file  ?Tobacco Use  ? Smoking status: Former  ?  Packs/day: 1.00  ?  Years: 50.00  ?  Pack years: 50.00  ?  Types: Cigarettes  ?  Quit date: 03/28/2006  ?  Years since quitting: 15.1  ? Smokeless tobacco: Never  ?Substance and Sexual Activity  ? Alcohol use: No  ? Drug use: No  ? Sexual activity: Not on file  ?Other Topics Concern  ? Not on file  ?Social History Narrative  ? Not on file  ? ?Social Determinants of Health  ? ?Financial Resource Strain: Not on file  ?Food Insecurity: Not on file  ?Transportation Needs: Not on file  ?Physical Activity: Not on file  ?Stress: Not on file  ?Social Connections: Not on file  ?Intimate Partner Violence: Not on file  ? ? ? ?Review of Systems: ?All  other systems reviewed and are otherwise negative except as noted above. ? ?Physical Exam: ?Vitals:  ? 05/28/21 0906  ?BP: 138/82  ?Pulse: 61  ?SpO2: 96%  ?Weight: 158 lb (71.7 kg)  ?Height: 6' (1.829 m)  ? ? ?GEN- The patient is well appearing, alert and oriented x 3 today.   ?HEENT: normocephalic, atraumatic; sclera clear, conjunctiva pink; hearing intact; oropharynx clear; neck supple, no JVP ?Lymph- no cervical lymphadenopathy ?Lungs- Clear to ausculation bilaterally, normal work of breathing.  No wheezes, rales, rhonchi ?Heart- Regular rate and rhythm, no murmurs, rubs or gallops, PMI not laterally displaced ?GI- soft, non-tender, non-distended, bowel sounds present, no  hepatosplenomegaly ?Extremities- no clubbing, cyanosis, or edema; DP/PT/radial pulses 2+ bilaterally ?MS- no significant deformity or atrophy ?Skin- warm and dry, no rash or lesion ?Psych- euthymic mood, full affect ?Neuro- strength and sensation are intact ? ?EKG is ordered. Personal review of EKG from today shows NSR at 72 bpm. PR interval 194 ms, QRS 100 ms ? ?Additional studies reviewed include: ?Previous EP office notes.  ? ?Assessment and Plan: ? ?Paroxysmal atrial fibrillation:  ?EKG today shows NSr at 61 bpm on flecainide ?Continue Xarelto 20 mg daily ?Continue flecainide 50 mg BID ?Will try on toprol 12.5 mg qhs.  ?Failed multaq with rash.  ? ?2. Nonobstructive coronary artery disease:  ?Currently on Imdur 30 mg. ?Denies s/s ischemia   ?He is having knee pain and cannot currently walk on treadmill. ?Will update Myoview for completeness on flecainide.  ? ?3. Hypertension:  ?Stable on current regimen.  ? ?4. Hyperlipidemia: ?Continued Crestor 20 mg per primary  ? ?Follow up with Dr. Curt Bears in 3 months  ? ?Shirley Friar, [name]  ?05/28/21 ?9:11 AM  ?

## 2021-05-28 ENCOUNTER — Other Ambulatory Visit: Payer: Self-pay

## 2021-05-28 ENCOUNTER — Encounter: Payer: Self-pay | Admitting: Student

## 2021-05-28 ENCOUNTER — Ambulatory Visit (INDEPENDENT_AMBULATORY_CARE_PROVIDER_SITE_OTHER): Payer: Medicare Other | Admitting: Student

## 2021-05-28 VITALS — BP 138/82 | HR 61 | Ht 72.0 in | Wt 158.0 lb

## 2021-05-28 DIAGNOSIS — I10 Essential (primary) hypertension: Secondary | ICD-10-CM

## 2021-05-28 DIAGNOSIS — I480 Paroxysmal atrial fibrillation: Secondary | ICD-10-CM | POA: Diagnosis not present

## 2021-05-28 DIAGNOSIS — E785 Hyperlipidemia, unspecified: Secondary | ICD-10-CM

## 2021-05-28 DIAGNOSIS — I25119 Atherosclerotic heart disease of native coronary artery with unspecified angina pectoris: Secondary | ICD-10-CM

## 2021-05-28 LAB — CBC
Hematocrit: 39.2 % (ref 37.5–51.0)
Hemoglobin: 12.9 g/dL — ABNORMAL LOW (ref 13.0–17.7)
MCH: 33 pg (ref 26.6–33.0)
MCHC: 32.9 g/dL (ref 31.5–35.7)
MCV: 100 fL — ABNORMAL HIGH (ref 79–97)
Platelets: 130 10*3/uL — ABNORMAL LOW (ref 150–450)
RBC: 3.91 x10E6/uL — ABNORMAL LOW (ref 4.14–5.80)
RDW: 13.4 % (ref 11.6–15.4)
WBC: 5.7 10*3/uL (ref 3.4–10.8)

## 2021-05-28 LAB — BASIC METABOLIC PANEL
BUN/Creatinine Ratio: 15 (ref 10–24)
BUN: 17 mg/dL (ref 8–27)
CO2: 24 mmol/L (ref 20–29)
Calcium: 8.6 mg/dL (ref 8.6–10.2)
Chloride: 103 mmol/L (ref 96–106)
Creatinine, Ser: 1.11 mg/dL (ref 0.76–1.27)
Glucose: 118 mg/dL — ABNORMAL HIGH (ref 70–99)
Potassium: 3.8 mmol/L (ref 3.5–5.2)
Sodium: 141 mmol/L (ref 134–144)
eGFR: 66 mL/min/{1.73_m2} (ref >59)

## 2021-05-28 MED ORDER — METOPROLOL SUCCINATE ER 25 MG PO TB24: 12.5000 mg | ORAL_TABLET | Freq: Every day | ORAL | 3 refills | Status: DC

## 2021-05-28 NOTE — Patient Instructions (Signed)
Medication Instructions:  ?Your physician has recommended you make the following change in your medication:  ? ?START: Metoprolol Succinate 12.5mg  daily at bedtime ? ?*If you need a refill on your cardiac medications before your next appointment, please call your pharmacy* ? ? ?Lab Work: ?TODAY: BMET, CBC ? ?If you have labs (blood work) drawn today and your tests are completely normal, you will receive your results only by: ?MyChart Message (if you have MyChart) OR ?A paper copy in the mail ?If you have any lab test that is abnormal or we need to change your treatment, we will call you to review the results. ? ? ?Testing/Procedures: ?Your physician has requested that you have a lexiscan myoview. For further information please visit [email or web address]. Please follow instruction sheet, as given. ? ? ?Follow-Up: ?At [facility], you and your health needs are our priority.  As part of our continuing mission to provide you with exceptional heart care, we have created designated Provider Care Teams.  These Care Teams include your primary Cardiologist (physician) and Advanced Practice Providers (APPs -  Physician Assistants and Nurse Practitioners) who all work together to provide you with the care you need, when you need it. ? ? ?Your next appointment:   ?3 month(s) ? ?The format for your next appointment:   ?In Person ? ?Provider:   ?Allegra Lai, MD{ ? ?Other Instructions ?See attached letter for stress test instsructions  ?

## 2021-06-01 ENCOUNTER — Telehealth (HOSPITAL_COMMUNITY): Payer: Self-pay | Admitting: *Deleted

## 2021-06-01 NOTE — Telephone Encounter (Signed)
Interpreter [phone] ? ?Left message on voicemail per DPR in reference to upcoming appointment scheduled on 06/08/2021 at 10:30 with detailed instructions given per Myocardial Perfusion Study Information Sheet for the test. LM to arrive 15 minutes early, and that it is imperative to arrive on time for appointment to keep from having the test rescheduled. If you need to cancel or reschedule your appointment, please call the office within 24 hours of your appointment. Failure to do so may result in a cancellation of your appointment, and a $50 no show fee. Phone number given for call back for any questions.  ? ?

## 2021-06-08 ENCOUNTER — Encounter (HOSPITAL_COMMUNITY): Payer: Medicare Other

## 2021-06-09 ENCOUNTER — Telehealth (HOSPITAL_COMMUNITY): Payer: Self-pay | Admitting: *Deleted

## 2021-06-09 NOTE — Telephone Encounter (Signed)
Left message on voicemail per DPR by phone interpreter That [phone] in reference to upcoming appointment scheduled on 06/16/21 at 1030 with detailed instructions given per Myocardial Perfusion Study Information Sheet for the test. LM to arrive 15 minutes early, and that it is imperative to arrive on time for appointment to keep from having the test rescheduled. If you need to cancel or reschedule your appointment, please call the office within 24 hours of your appointment. Failure to do so may result in a cancellation of your appointment, and a $50 no show fee. Phone number given for call back for any questions. Reyhan Moronta, [name] ? ? ?

## 2021-06-16 ENCOUNTER — Ambulatory Visit (HOSPITAL_COMMUNITY): Payer: Medicare Other | Attending: Internal Medicine

## 2021-06-16 ENCOUNTER — Other Ambulatory Visit: Payer: Self-pay

## 2021-06-16 DIAGNOSIS — I10 Essential (primary) hypertension: Secondary | ICD-10-CM | POA: Diagnosis not present

## 2021-06-16 DIAGNOSIS — I25119 Atherosclerotic heart disease of native coronary artery with unspecified angina pectoris: Secondary | ICD-10-CM | POA: Diagnosis not present

## 2021-06-16 DIAGNOSIS — I480 Paroxysmal atrial fibrillation: Secondary | ICD-10-CM | POA: Diagnosis not present

## 2021-06-16 LAB — MYOCARDIAL PERFUSION IMAGING
LV dias vol: 55 mL (ref 62–150)
LV sys vol: 20 mL
Nuc Stress EF: 63 %
Peak HR: 78 {beats}/min
Rest HR: 60 {beats}/min
Rest Nuclear Isotope Dose: 9.8 mCi
SDS: 0
SRS: 0
SSS: 0
ST Depression (mm): 0 mm
Stress Nuclear Isotope Dose: 31.5 mCi
TID: 1.05

## 2021-06-16 MED ORDER — REGADENOSON 0.4 MG/5ML IV SOLN
0.4000 mg | Freq: Once | INTRAVENOUS | Status: AC
  Administered 2021-06-16: 0.4 mg via INTRAVENOUS

## 2021-06-16 MED ORDER — TECHNETIUM TC 99M TETROFOSMIN IV KIT
31.5000 | PACK | Freq: Once | INTRAVENOUS | Status: AC | PRN
  Administered 2021-06-16: 31.5 via INTRAVENOUS
  Filled 2021-06-16: qty 32

## 2021-06-16 MED ORDER — TECHNETIUM TC 99M TETROFOSMIN IV KIT
9.8000 | PACK | Freq: Once | INTRAVENOUS | Status: AC | PRN
  Administered 2021-06-16: 9.8 via INTRAVENOUS
  Filled 2021-06-16: qty 10

## 2021-07-28 DIAGNOSIS — M810 Age-related osteoporosis without current pathological fracture: Secondary | ICD-10-CM | POA: Diagnosis not present

## 2021-08-12 DIAGNOSIS — Z923 Personal history of irradiation: Secondary | ICD-10-CM | POA: Diagnosis not present

## 2021-08-12 DIAGNOSIS — C3412 Malignant neoplasm of upper lobe, left bronchus or lung: Secondary | ICD-10-CM | POA: Diagnosis not present

## 2021-08-12 DIAGNOSIS — C9201 Acute myeloblastic leukemia, in remission: Secondary | ICD-10-CM | POA: Diagnosis not present

## 2021-08-26 DIAGNOSIS — M25562 Pain in left knee: Secondary | ICD-10-CM | POA: Diagnosis not present

## 2021-08-26 DIAGNOSIS — C9201 Acute myeloblastic leukemia, in remission: Secondary | ICD-10-CM | POA: Diagnosis not present

## 2021-09-06 ENCOUNTER — Ambulatory Visit (INDEPENDENT_AMBULATORY_CARE_PROVIDER_SITE_OTHER): Payer: Medicare Other

## 2021-09-06 ENCOUNTER — Ambulatory Visit (INDEPENDENT_AMBULATORY_CARE_PROVIDER_SITE_OTHER): Payer: Medicare Other | Admitting: Physician Assistant

## 2021-09-06 DIAGNOSIS — M1712 Unilateral primary osteoarthritis, left knee: Secondary | ICD-10-CM

## 2021-09-06 NOTE — Progress Notes (Signed)
Office Visit Note   Patient: Gerald [name]           Date of Birth: [birth year]           MRN: [id number] Visit Date: 09/06/2021              Requested by: Antony Contras, MD [address] [place],  [place] [address] PCP: Antony Contras, MD   Assessment & Plan: Visit Diagnoses:  1. Primary osteoarthritis of left knee     Plan: Impression is left knee osteoarthritis with possible degenerative tearing medial meniscus.  Today, discussed various treatment options to include cortisone injection.  He would like to find out the cost of this from insurance prior to proceeding.  He will follow-up if he decides to proceed.  Otherwise, follow-up with [place] as needed.  Follow-Up Instructions: Return if symptoms worsen or fail to improve.   Orders:  Orders Placed This Encounter  Procedures   XR Knee Complete 4 Views Left   No orders of the defined types were placed in this encounter.     Procedures: No procedures performed   Clinical Data: No additional findings.   Subjective: Chief Complaint  Patient presents with   Left Knee - Pain    HPI patient is a pleasant 81 year old [place] gentleman who is here today with an interpreter.  He is here with left knee pain for the past 4 to 5 months.  He denies any injury or change in activity.  The pain he has is to the medial aspect and is worse with pivoting.  He notes occasional swelling.  He has not tried any oral medication for this but has tried topical NSAIDs without significant relief.  No previous cortisone injection to the left knee.  Review of Systems as detailed in HPI.  All others reviewed and are negative.   Objective: Vital Signs: There were no vitals taken for this visit.  Physical Exam well-developed well-nourished gentleman in no acute distress.  Alert and oriented x3.  Ortho Exam left knee exam shows no effusion.  Range of motion 5 to 100 degrees.  Medial joint line tenderness.  Mild patellofemoral crepitus.   He is neurovascular intact distally.  Specialty Comments:  No specialty comments available.  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   Unspecified deficiency anemia 04/16/2013   Other pancytopenia ([place]) 04/09/2013   Chronic anticoagulation 01/09/2013   Headache 12/17/2012   Hyperlipidemia    Smoking    Small cell lung cancer ([place]) 12/27/2010   Past Medical History:  Diagnosis Date   A-fib [facility])    Allergy    seasonal allergies   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   CARDIOVERSION N/A 07/22/2019   Procedure: CARDIOVERSION;  Surgeon: Jerline Pain, MD;  Location: [facility] ENDOSCOPY;  Service: Cardiovascular;  Laterality: N/A;   NM MYOCAR [place]  01/23/2007   mild perfusion defect seen in the basal inferior and mid inferior regions - c/w attenuation defect. No ischemia present.    Social History   Occupational History   Not on file  Tobacco Use   Smoking status: Former    Packs/day: 1.00    Years: 50.00    Total pack years: 50.00    Types: Cigarettes    Quit date: 03/28/2006    Years since quitting: 15.4   Smokeless tobacco: Never  Substance and Sexual Activity   Alcohol use: No   Drug use: No   Sexual activity: Not on file

## 2021-09-14 ENCOUNTER — Ambulatory Visit: Payer: Medicare Other | Admitting: Cardiology

## 2021-10-22 NOTE — Progress Notes (Unsigned)
PCP:  Antony Contras, MD Primary Cardiologist: Shelva Majestic, MD Electrophysiologist: Constance Haw, MD   Rafael GARRETT MITCHUM is a 81 y.o. male seen today for Will Meredith Leeds, MD for routine electrophysiology followup.  Since last being seen in our clinic the patient reports doing well. He has stopped toprol due to perceived chills. Otherwise, he denies chest pain, palpitations, dyspnea, PND, orthopnea, nausea, vomiting, dizziness, syncope, edema, weight gain, or early satiety. He does still have mild dyspnea with moderate exertion, unchanged since last visit. Myoview was reassuring at that time.   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   CARDIOVERSION N/A 07/22/2019   Procedure: CARDIOVERSION;  Surgeon: Jerline Pain, MD;  Location: [facility] ENDOSCOPY;  Service: Cardiovascular;  Laterality: N/A;   NM MYOCAR [place]  01/23/2007   mild perfusion defect seen in the basal inferior and mid inferior regions - c/w attenuation defect. No ischemia present.    Current Outpatient Medications  Medication Sig Dispense Refill   flecainide (TAMBOCOR) 50 MG tablet TAKE 1 TABLET BY MOUTH TWICE A DAY 180 tablet 2   metoprolol succinate (TOPROL XL) 25 MG 24 hr tablet Take 0.5 tablets (12.5 mg total) by mouth at bedtime. 45 tablet 3   nitroGLYCERIN (NITROSTAT) 0.4 MG SL tablet  PLACE 1 TABLET UNDER THE TONGUE EVERY 5 (FIVE) MINUTES AS NEEDED FOR CHEST PAIN. 25 tablet 3   rosuvastatin (CRESTOR) 20 MG tablet Take 1 tablet (20 mg total) by mouth daily. 90 tablet 3   VITAMIN D PO Take 2,000 Units by mouth every other day.      XARELTO 20 MG TABS tablet TAKE 1 TABLET BY MOUTH DAILY WITH SUPPER. 90 tablet 3   No current facility-administered medications for this visit.    No Known Allergies  Social History   Socioeconomic History   Marital status: Married    Spouse name: Not on file   Number of children: Not on file   Years of education: Not on file   Highest education level: Not on file  Occupational History   Not on file  Tobacco Use   Smoking status: Former    Packs/day: 1.00    Years: 50.00    Total pack years: 50.00    Types: Cigarettes    Quit date: 03/28/2006    Years since quitting: 15.5   Smokeless tobacco: Never  Substance and Sexual Activity   Alcohol use: No   Drug use: No   Sexual activity: Not on file  Other Topics Concern   Not on file  Social History Narrative   Not on file   Social Determinants of Health   Financial Resource Strain: Not on file  Food Insecurity: Not on file  Transportation Needs: Not on file  Physical Activity: Not on file  Stress: Not on  file  Social Connections: Not on file  Intimate Partner Violence: Not on file     Review of Systems: All other systems reviewed and are otherwise negative except as noted above.  Physical Exam: There were no vitals filed for this visit.  GEN- The patient is well appearing, alert and oriented x 3 today.   HEENT: normocephalic, atraumatic; sclera clear, conjunctiva pink; hearing intact; oropharynx clear; neck supple, no JVP Lymph- no cervical lymphadenopathy Lungs- Clear to ausculation bilaterally, normal work of breathing.  No wheezes, rales, rhonchi Heart- Regular rate and rhythm, no murmurs, rubs or gallops, PMI not laterally displaced GI- soft, non-tender,  non-distended, bowel sounds present, no hepatosplenomegaly Extremities- no clubbing, cyanosis, or edema; DP/PT/radial pulses 2+ bilaterally MS- no significant deformity or atrophy Skin- warm and dry, no rash or lesion Psych- euthymic mood, full affect Neuro- strength and sensation are intact  EKG is ordered. Personal review of EKG from today shows NSR at 63 bpm, stable intervals  Additional studies reviewed include: Previous EP office notes.   Assessment and Plan:  1. Paroxysmal Atrial Fibrillation  EKG today shows NSR Continue Xarelto for CHA2DS2VASC of at least 4 Continue Flecainide  Stop toprol pt preference. Will try bisoprolol 2.5 mg daily.   2. CAD No s/s ischemia and Continue beta blocker  Normal myoview 05/2021  3. HTN Stable on current regimen   Follow up with Dr. Curt Bears in 6 months   Shirley Friar, [place]  10/22/21 3:37 PM

## 2021-10-27 ENCOUNTER — Ambulatory Visit (INDEPENDENT_AMBULATORY_CARE_PROVIDER_SITE_OTHER): Payer: Medicare Other | Admitting: Student

## 2021-10-27 ENCOUNTER — Encounter: Payer: Self-pay | Admitting: Student

## 2021-10-27 VITALS — BP 146/72 | HR 63 | Ht 67.0 in | Wt 161.0 lb

## 2021-10-27 DIAGNOSIS — I480 Paroxysmal atrial fibrillation: Secondary | ICD-10-CM | POA: Diagnosis not present

## 2021-10-27 DIAGNOSIS — I10 Essential (primary) hypertension: Secondary | ICD-10-CM | POA: Diagnosis not present

## 2021-10-27 DIAGNOSIS — I25119 Atherosclerotic heart disease of native coronary artery with unspecified angina pectoris: Secondary | ICD-10-CM | POA: Diagnosis not present

## 2021-10-27 MED ORDER — BISOPROLOL FUMARATE 5 MG PO TABS: 2.5000 mg | ORAL_TABLET | Freq: Every day | ORAL | 3 refills | Status: DC

## 2021-10-27 NOTE — Patient Instructions (Signed)
Medication Instructions:  Your physician has recommended you make the following change in your medication:   START: Bisoprolol 2.5mg  daily at bedtime  *If you need a refill on your cardiac medications before your next appointment, please call your pharmacy*   Lab Work: None If you have labs (blood work) drawn today and your tests are completely normal, you will receive your results only by: [place] (if you have MyChart) OR A paper copy in the mail If you have any lab test that is abnormal or we need to change your treatment, we will call you to review the results.   Follow-Up: At [facility], you and your health needs are our priority.  As part of our continuing mission to provide you with exceptional heart care, we have created designated Provider Care Teams.  These Care Teams include your primary Cardiologist (physician) and Advanced Practice Providers (APPs -  Physician Assistants and Nurse Practitioners) who all work together to provide you with the care you need, when you need it.   Your next appointment:   6 month(s)  The format for your next appointment:   In Person  Provider:   Allegra Lai, MD{

## 2021-10-29 ENCOUNTER — Ambulatory Visit: Payer: Medicare Other | Admitting: Cardiology

## 2021-11-08 ENCOUNTER — Emergency Department (HOSPITAL_COMMUNITY)
Admission: EM | Admit: 2021-11-08 | Discharge: 2021-11-08 | Disposition: A | Discharge status: Home / Self Care | Payer: Medicare Other | Attending: Emergency Medicine | Admitting: Emergency Medicine

## 2021-11-08 DIAGNOSIS — R0602 Shortness of breath: Secondary | ICD-10-CM | POA: Insufficient documentation

## 2021-11-08 DIAGNOSIS — R0789 Other chest pain: Secondary | ICD-10-CM | POA: Diagnosis not present

## 2021-11-08 DIAGNOSIS — R6889 Other general symptoms and signs: Secondary | ICD-10-CM | POA: Diagnosis not present

## 2021-11-08 DIAGNOSIS — I480 Paroxysmal atrial fibrillation: Secondary | ICD-10-CM | POA: Insufficient documentation

## 2021-11-08 DIAGNOSIS — Z743 Need for continuous supervision: Secondary | ICD-10-CM | POA: Diagnosis not present

## 2021-11-08 DIAGNOSIS — R072 Precordial pain: Secondary | ICD-10-CM

## 2021-11-08 DIAGNOSIS — I4891 Unspecified atrial fibrillation: Secondary | ICD-10-CM | POA: Diagnosis not present

## 2021-11-08 DIAGNOSIS — R079 Chest pain, unspecified: Secondary | ICD-10-CM | POA: Insufficient documentation

## 2021-11-08 DIAGNOSIS — I499 Cardiac arrhythmia, unspecified: Secondary | ICD-10-CM | POA: Diagnosis not present

## 2021-11-08 LAB — CBC WITH DIFFERENTIAL/PLATELET
Abs Immature Granulocytes: 0.01 10*3/uL (ref 0.00–0.07)
Basophils Absolute: 0 10*3/uL (ref 0.0–0.1)
Basophils Relative: 1 %
Eosinophils Absolute: 0.4 10*3/uL (ref 0.0–0.5)
Eosinophils Relative: 6 %
HCT: 38.5 % — ABNORMAL LOW (ref 39.0–52.0)
Hemoglobin: 13.2 g/dL (ref 13.0–17.0)
Immature Granulocytes: 0 %
Lymphocytes Relative: 25 %
Lymphs Abs: 1.5 10*3/uL (ref 0.7–4.0)
MCH: 33.2 pg (ref 26.0–34.0)
MCHC: 34.3 g/dL (ref 30.0–36.0)
MCV: 96.7 fL (ref 80.0–100.0)
Monocytes Absolute: 0.7 10*3/uL (ref 0.1–1.0)
Monocytes Relative: 11 %
Neutro Abs: 3.4 10*3/uL (ref 1.7–7.7)
Neutrophils Relative %: 57 %
Platelets: 127 10*3/uL — ABNORMAL LOW (ref 150–400)
RBC: 3.98 MIL/uL — ABNORMAL LOW (ref 4.22–5.81)
RDW: 13 % (ref 11.5–15.5)
WBC: 5.9 10*3/uL (ref 4.0–10.5)
nRBC: 0 % (ref 0.0–0.2)

## 2021-11-08 LAB — TROPONIN I (HIGH SENSITIVITY)
Troponin I (High Sensitivity): 12 ng/L (ref <18)
Troponin I (High Sensitivity): 10 ng/L (ref <18)

## 2021-11-08 LAB — COMPREHENSIVE METABOLIC PANEL
ALT: 23 U/L (ref 0–44)
AST: 26 U/L (ref 15–41)
Albumin: 3.6 g/dL (ref 3.5–5.0)
Alkaline Phosphatase: 48 U/L (ref 38–126)
Anion gap: 9 (ref 5–15)
BUN: 19 mg/dL (ref 8–23)
CO2: 23 mmol/L (ref 22–32)
Calcium: 8.8 mg/dL — ABNORMAL LOW (ref 8.9–10.3)
Chloride: 105 mmol/L (ref 98–111)
Creatinine, Ser: 1.21 mg/dL (ref 0.61–1.24)
GFR, Estimated: 60 mL/min — ABNORMAL LOW (ref >60)
Glucose, Bld: 113 mg/dL — ABNORMAL HIGH (ref 70–99)
Potassium: 4 mmol/L (ref 3.5–5.1)
Sodium: 137 mmol/L (ref 135–145)
Total Bilirubin: 0.4 mg/dL (ref 0.3–1.2)
Total Protein: 6.8 g/dL (ref 6.5–8.1)

## 2021-11-08 LAB — MAGNESIUM: Magnesium: 1.8 mg/dL (ref 1.7–2.4)

## 2021-11-08 NOTE — ED Provider Notes (Signed)
[facility] EMERGENCY DEPARTMENT Provider Note   CSN: [id number] Arrival date & time: 11/08/21  1324     History  Chief Complaint  Patient presents with   Chest Pain    Gerald [name] is a 81 y.o. male.  81 yo M here with chest pain.  Has a history of paroxysmal atrial fibrillation.  Tonight he started have some chest pain associated with shortness of breath.  Took aspirin and 3 nitro that helped some but not getting better as he called EMS.  His was reportedly in atrial fibrillation with rapid ventricular response (rhythm strips at bedside are consistent with this).  He spontaneously converted in the ambulance and since then he has been feeling fine.  He has no chest pain, shortness of breath, nausea or lightheadedness at this time.   Chest Pain      Home Medications Prior to Admission medications   Medication Sig Start Date End Date Taking? Authorizing Provider  bisoprolol (ZEBETA) 5 MG tablet Take 0.5 tablets (2.5 mg total) by mouth at bedtime. 10/27/21   Shirley Friar, [name]  flecainide (TAMBOCOR) 50 MG tablet TAKE 1 TABLET BY MOUTH TWICE A DAY 03/23/21   Camnitz, Ocie Doyne, MD  nitroGLYCERIN (NITROSTAT) 0.4 MG SL tablet PLACE 1 TABLET UNDER THE TONGUE EVERY 5 (FIVE) MINUTES AS NEEDED FOR CHEST PAIN. 05/12/20   Troy Sine, MD  rosuvastatin (CRESTOR) 20 MG tablet Take 1 tablet (20 mg total) by mouth daily. 05/06/20 05/28/21  [name], Lorelee Cover., [name]  VITAMIN D PO Take 2,000 Units by mouth every other day.     [provider]  XARELTO 20 MG TABS tablet TAKE 1 TABLET BY MOUTH DAILY WITH SUPPER. 05/10/21   Troy Sine, MD      Allergies    Patient has no known allergies.    Review of Systems   Review of Systems  Cardiovascular:  Positive for chest pain.    Physical Exam Updated Vital Signs BP 129/75   Pulse (!) 55   Temp 97.8 F (36.6 C) (Oral)   Resp 18   SpO2 99%  Physical Exam Vitals and nursing note reviewed.   Constitutional:      Appearance: He is well-developed.  HENT:     Head: Normocephalic and atraumatic.  Cardiovascular:     Rate and Rhythm: Normal rate and regular rhythm.  Pulmonary:     Effort: Pulmonary effort is normal. No respiratory distress.  Abdominal:     General: There is no distension.  Musculoskeletal:        General: Normal range of motion.     Cervical back: Normal range of motion.     Right lower leg: No edema.     Left lower leg: No edema.  Neurological:     Mental Status: He is alert.     ED Results / Procedures / Treatments   Labs (all labs ordered are listed, but only abnormal results are displayed) Labs Reviewed  CBC WITH DIFFERENTIAL/PLATELET - Abnormal; Notable for the following components:      Result Value   RBC 3.98 (*)    HCT 38.5 (*)    Platelets 127 (*)    All other components within normal limits  COMPREHENSIVE METABOLIC PANEL - Abnormal; Notable for the following components:   Glucose, Bld 113 (*)    Calcium 8.8 (*)    GFR, Estimated 60 (*)    All other components within normal limits  MAGNESIUM  TROPONIN  I (HIGH SENSITIVITY)  TROPONIN I (HIGH SENSITIVITY)    EKG None  Radiology No results found.  Procedures Procedures    Medications Ordered in ED Medications - No data to display  ED Course/ Medical Decision Making/ A&P                           Medical Decision Making Amount and/or Complexity of Data Reviewed Labs: ordered.   CXR done and showed no obvious abnormalities (independently viewed and interpreted by myself and radiology read reviewed).   Suspect patient likely had chest discomfort from his A-fib however is also possible that he was having cardiac event that caused him to go back in A-fib.  EKG here looks okay first troponin is reassuring.  Patient is pending second troponin at time of checkout.  Suspect will be able to discharge if still NSR, cp free.    Final Clinical Impression(s) / ED Diagnoses Final  diagnoses:  None    Rx / DC Orders ED Discharge Orders     None         Brayleigh Rybacki, Corene Cornea, MD 11/08/21 [phone]

## 2021-11-08 NOTE — ED Provider Notes (Signed)
Blood pressure 131/76, pulse (!) 57, temperature 97.6 F (36.4 C), temperature source Oral, resp. rate 20, SpO2 98 %.  Assuming care from Dr. Dayna Barker.  In short, Gerald [name] is a 81 y.o. male with a chief complaint of Chest Pain .  Refer to the original H&P for additional details.  The current plan of care is to follow up on repeat troponin.  09:30 AM  Second troponin resulting within normal limits.  Patient feeling well and in sinus rhythm here.  He is awake and alert.  On reassessment and discussion with family plan for discharge home with close PCP and cardiology follow-up.  Discussed strict ED return precaution.    Margette Fast, MD 11/08/21 0930

## 2021-11-08 NOTE — ED Triage Notes (Signed)
Pt bib [facility] from home c/o chest pain that started around 0200, describes pain as pressure in middle of chest. Pt took 3 nitro + 324 ASA with minimal relief. EKG initially showed afib 100-160s per EMS, converted to NSR 70s in truck.

## 2021-11-08 NOTE — Discharge Instructions (Signed)

## 2021-11-09 DIAGNOSIS — C9201 Acute myeloblastic leukemia, in remission: Secondary | ICD-10-CM | POA: Diagnosis not present

## 2021-11-09 DIAGNOSIS — D696 Thrombocytopenia, unspecified: Secondary | ICD-10-CM | POA: Diagnosis not present

## 2021-11-09 DIAGNOSIS — I25119 Atherosclerotic heart disease of native coronary artery with unspecified angina pectoris: Secondary | ICD-10-CM | POA: Diagnosis not present

## 2021-11-09 DIAGNOSIS — M25512 Pain in left shoulder: Secondary | ICD-10-CM | POA: Diagnosis not present

## 2021-11-09 DIAGNOSIS — Z85118 Personal history of other malignant neoplasm of bronchus and lung: Secondary | ICD-10-CM | POA: Diagnosis not present

## 2021-11-09 DIAGNOSIS — J309 Allergic rhinitis, unspecified: Secondary | ICD-10-CM | POA: Diagnosis not present

## 2021-11-09 DIAGNOSIS — I4891 Unspecified atrial fibrillation: Secondary | ICD-10-CM | POA: Diagnosis not present

## 2021-11-09 DIAGNOSIS — R7303 Prediabetes: Secondary | ICD-10-CM | POA: Diagnosis not present

## 2021-11-09 DIAGNOSIS — E559 Vitamin D deficiency, unspecified: Secondary | ICD-10-CM | POA: Diagnosis not present

## 2021-11-09 DIAGNOSIS — E782 Mixed hyperlipidemia: Secondary | ICD-10-CM | POA: Diagnosis not present

## 2021-11-09 DIAGNOSIS — Z0000 Encounter for general adult medical examination without abnormal findings: Secondary | ICD-10-CM | POA: Diagnosis not present

## 2021-11-24 DIAGNOSIS — R7303 Prediabetes: Secondary | ICD-10-CM | POA: Diagnosis not present

## 2021-11-24 DIAGNOSIS — E782 Mixed hyperlipidemia: Secondary | ICD-10-CM | POA: Diagnosis not present

## 2021-11-24 DIAGNOSIS — E559 Vitamin D deficiency, unspecified: Secondary | ICD-10-CM | POA: Diagnosis not present

## 2021-11-24 DIAGNOSIS — D696 Thrombocytopenia, unspecified: Secondary | ICD-10-CM | POA: Diagnosis not present

## 2021-11-24 DIAGNOSIS — Z0000 Encounter for general adult medical examination without abnormal findings: Secondary | ICD-10-CM | POA: Diagnosis not present

## 2021-12-03 ENCOUNTER — Other Ambulatory Visit: Payer: Self-pay | Admitting: Cardiology

## 2021-12-28 DIAGNOSIS — C9201 Acute myeloblastic leukemia, in remission: Secondary | ICD-10-CM | POA: Diagnosis not present

## 2021-12-28 DIAGNOSIS — C3412 Malignant neoplasm of upper lobe, left bronchus or lung: Secondary | ICD-10-CM | POA: Diagnosis not present

## 2021-12-28 DIAGNOSIS — C3490 Malignant neoplasm of unspecified part of unspecified bronchus or lung: Secondary | ICD-10-CM | POA: Diagnosis not present

## 2022-02-02 ENCOUNTER — Ambulatory Visit: Payer: Medicare Other | Admitting: Orthopaedic Surgery

## 2022-02-03 ENCOUNTER — Ambulatory Visit (INDEPENDENT_AMBULATORY_CARE_PROVIDER_SITE_OTHER): Payer: Medicare Other | Admitting: Orthopaedic Surgery

## 2022-02-03 ENCOUNTER — Encounter: Payer: Self-pay | Admitting: Orthopaedic Surgery

## 2022-02-03 DIAGNOSIS — M1712 Unilateral primary osteoarthritis, left knee: Secondary | ICD-10-CM | POA: Diagnosis not present

## 2022-02-03 MED ORDER — METHYLPREDNISOLONE ACETATE 40 MG/ML IJ SUSP
40.0000 mg | INTRAMUSCULAR | Status: AC | PRN
  Administered 2022-02-03: 40 mg via INTRA_ARTICULAR

## 2022-02-03 MED ORDER — LIDOCAINE HCL 1 % IJ SOLN
2.0000 mL | INTRAMUSCULAR | Status: AC | PRN
  Administered 2022-02-03: 2 mL

## 2022-02-03 MED ORDER — BUPIVACAINE HCL 0.5 % IJ SOLN
2.0000 mL | INTRAMUSCULAR | Status: AC | PRN
  Administered 2022-02-03: 2 mL via INTRA_ARTICULAR

## 2022-02-03 NOTE — Progress Notes (Signed)
Office Visit Note   Patient: Gerald [name]           Date of Birth: [birth year]           MRN: [id number] Visit Date: 02/03/2022              Requested by: Antony Contras, MD [address] [place],  [place] [address] PCP: Antony Contras, MD   Assessment & Plan: Visit Diagnoses:  1. Primary osteoarthritis of left knee     Plan: Impression is left knee osteoarthritis.  He does have significant joint space narrowing of the medial compartment.  Periarticular spurring and chondrocalcinosis present as well.  Based on treatment options he elected to undergo a left knee injection today.  He tolerated this well.  We provided information on viscosupplementation.  We will see him back as needed.  Follow-Up Instructions: No follow-ups on file.   Orders:  No orders of the defined types were placed in this encounter.  No orders of the defined types were placed in this encounter.     Procedures: Large Joint Inj: L knee on 02/03/2022 10:50 AM Details: 22 G needle Medications: 2 mL bupivacaine 0.5 %; 2 mL lidocaine 1 %; 40 mg methylPREDNISolone acetate 40 MG/ML Outcome: tolerated well, no immediate complications Patient was prepped and draped in the usual sterile fashion.       Clinical Data: No additional findings.   Subjective: Chief Complaint  Patient presents with   Left Knee - Pain    HPI Patient is a 81 year old [place] gentleman who comes in for chronic left knee pain.  He has known osteoarthritis.  He is interested in a cortisone injection today.  Review of Systems  Constitutional: Negative.   All other systems reviewed and are negative.    Objective: Vital Signs: There were no vitals taken for this visit.  Physical Exam Vitals and nursing note reviewed.  Constitutional:      Appearance: He is well-developed.  Pulmonary:     Effort: Pulmonary effort is normal.  Abdominal:     Palpations: Abdomen is soft.  Skin:    General: Skin is warm.   Neurological:     Mental Status: He is alert and oriented to person, place, and time.  Psychiatric:        Behavior: Behavior normal.        Thought Content: Thought content normal.        Judgment: Judgment normal.     Ortho Exam Examination of the left knee shows no joint effusion.  He does have medial joint line tenderness. Specialty Comments:  No specialty comments available.  Imaging: No results found.   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   Unspecified deficiency anemia 04/16/2013   Other pancytopenia ([place]) 04/09/2013   Chronic anticoagulation 01/09/2013   Headache 12/17/2012   Hyperlipidemia    Smoking    Small cell lung cancer ([place]) 12/27/2010   Past Medical History:  Diagnosis Date   A-fib [facility])    Allergy    seasonal allergies   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   CARDIOVERSION N/A 07/22/2019   Procedure: CARDIOVERSION;  Surgeon: Jerline Pain, MD;  Location: [facility] ENDOSCOPY;  Service: Cardiovascular;  Laterality: N/A;   NM MYOCAR [place]  01/23/2007   mild perfusion defect seen in the basal inferior and mid inferior regions - c/w attenuation defect. No ischemia present.   Social History   Occupational History   Not on file  Tobacco Use   Smoking status: Former    Packs/day: 1.00    Years: 50.00    Total pack years: 50.00    Types: Cigarettes    Quit date: 03/28/2006    Years since quitting: 15.8   Smokeless tobacco: Never  Substance and Sexual Activity   Alcohol use: No   Drug use: No   Sexual activity: Not on file

## 2022-02-11 DIAGNOSIS — Z23 Encounter for immunization: Secondary | ICD-10-CM | POA: Diagnosis not present

## 2022-02-11 DIAGNOSIS — M810 Age-related osteoporosis without current pathological fracture: Secondary | ICD-10-CM | POA: Diagnosis not present

## 2022-02-24 ENCOUNTER — Telehealth: Payer: Self-pay | Admitting: Cardiovascular Disease

## 2022-02-24 MED ORDER — BISOPROLOL FUMARATE 5 MG PO TABS: 2.5000 mg | ORAL_TABLET | Freq: Every day | ORAL | 3 refills | Status: DC

## 2022-02-24 NOTE — Telephone Encounter (Signed)
Dtr reports pt has not started the Bisoprolol that was recommended in August.  She states that he wants to start it now. Sent in Rx  Advised to call if SE occur after starting. Patient verbalized understanding and agreeable to plan.

## 2022-02-24 NOTE — Telephone Encounter (Signed)
Pt c/o medication issue:  1. Name of Medication: Metoprolol  2. How are you currently taking this medication (dosage and times per day)?   3. Are you having a reaction (difficulty breathing--STAT)?   4. What is your medication issue? Patient wants to stop taking this medicine and go back on Metoprolol

## 2022-03-03 ENCOUNTER — Telehealth: Payer: Self-pay | Admitting: Cardiology

## 2022-03-03 NOTE — Telephone Encounter (Signed)
Left message on voicemail for pt or his daughter to call us back about the refill request sent in today. Pt is not on the Metoprolol so need to verify what pt is taking.

## 2022-03-03 NOTE — Telephone Encounter (Signed)
Spoke with daughter about medication. She stated that pt does not want to start Bioprolol but would like to continue on the Metoprolol. Please advise

## 2022-03-03 NOTE — Telephone Encounter (Signed)
*  STAT* If patient is at the pharmacy, call can be transferred to refill team.   1. Which medications need to be refilled? (please list name of each medication and dose if known) new prescription for Metoprolol  2. Which pharmacy/location (including street and city if local pharmacy) is medication to be sent to? [facility] [address], [place]  3. Do they need a 30 day or 90 day supply? 90 days and refills

## 2022-04-27 ENCOUNTER — Encounter: Payer: Self-pay | Admitting: Cardiovascular Disease

## 2022-04-27 ENCOUNTER — Ambulatory Visit: Payer: 59 | Attending: Cardiovascular Disease | Admitting: Cardiovascular Disease

## 2022-04-27 VITALS — BP 144/77 | HR 55 | Ht 67.0 in | Wt 157.4 lb

## 2022-04-27 DIAGNOSIS — Z7901 Long term (current) use of anticoagulants: Secondary | ICD-10-CM

## 2022-04-27 DIAGNOSIS — I480 Paroxysmal atrial fibrillation: Secondary | ICD-10-CM

## 2022-04-27 DIAGNOSIS — I25119 Atherosclerotic heart disease of native coronary artery with unspecified angina pectoris: Secondary | ICD-10-CM

## 2022-04-27 DIAGNOSIS — I10 Essential (primary) hypertension: Secondary | ICD-10-CM | POA: Diagnosis not present

## 2022-04-27 DIAGNOSIS — E785 Hyperlipidemia, unspecified: Secondary | ICD-10-CM

## 2022-04-27 NOTE — Progress Notes (Signed)
Cardiology Office Note    Date:  04/28/2022   ID:  Gerald, [name] [birth year], MRN [id number]  PCP:  [name], MD  Cardiologist:  [name], MD   29 month F/U evaluation   History of Present Illness:  Gerald [name] is a 82 y.o.  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 Between November 2012 in March 2013 he completed 6 cycles of carboplatin, etoposide. He is status post TEE guided cardioversion in 2011. Initially he was treated with Coumadin therapy. He develop recurrent atrial fibrillation in November 2012 and since that time has been on xarelto therapy.   Since I last saw him, he has seen several of the extenders.  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  In December 2017 he saw [name], and had nondiagnostic lateral T changes On  03/30/2016 a nuclear perfusion study was normal with note scar or ischemia.  Ejection fraction was 65%.  He has had some issues with bradycardia and his dose of her blocker had been reduced.   He was recently seen in the emergency room in November 2018 and was in atrial fibrillation with rapid ventricular response.  He underwent cardioversion in the emergency room.  He was seen by [name] in follow-up.  He was bradycardic and as result, he was unable to further increase his metoprolol.   When I saw him in February 2019  he was doing well.  He was on metoprolol 25 mg twice a day in addition to simvastatin 40 mg and Xarelto 20 g daily.  He denied palpitations, chest pain, awareness of atrial fibrillation.    He was seen in August 2019 by [name] and at that time family had noticed that he had taken 2 sublingual nitroglycerin over the month previously.  He had atypical nonexertional chest pain.  He underwent a nuclear perfusion study on November 23, 2017  which continued to show normal perfusion.  There were no ECG changes.  Post stress ejection fraction was 64%.  Subsequently, he has continued to do well and specifically denies chest pain, PND orthopnea.  He is unaware of palpitations presyncope or syncope.    He has been diagnosed with acute myeloid leukemia also was found to have additional lung nodules.  He has undergone radiation therapy last month.  On the evening of April 25, 2019 while sleeping he was awakened with his heart racing and also a sensation of chest heaviness.  EMS was called and he was found to be in atrial fibrillation with rapid ventricular response.  Presented to the emergency room and was given aspirin, nitroglycerin, and intravenous diltiazem.  He was anticoagulated on Xarelto.  L on the monitor he converted to sinus rhythm.  Follow-up Cardiologic evaluation was advised.    I evaluated him in a telemedicine visit in February 2021.   The patient speaks [place] but his son was with him during the interview who acted as the interpreter.  He has been seen by [name], PA on several occasions.  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  A Myoview study showed an  EF at 62% with normal perfusion.  In April 2021 he developed recurrent atrial fibrillation with RVR when evaluated by [name], PA.  He did not convert with further titration of metoprolol and ultimately went to the emergency room with right shoulder pain.  Troponin was negative.  Heunderwent successful cardioversion on July 22, 2019.  He was  evaluated by [name], PA on Jul 31, 2019. I last saw him on November 18, 2019  Here was with his  son who acts as his interpreter.  Patient feels well.  He is unaware of any breakthrough heart rate irregularity.  I discussed with his son  whether or not there is a potential component of sleep apnea since he has had recurrent atrial fibrillation.  Patient has been told that he does snore mildly by his wife.  However he sleeps well.  He denies daytime sleepiness.  There is no issues of gasping for breath.  He does take an occasional daytime nap.  With reference to his lung cancer this apparently has been stable.   Since I last saw him, he has been evaluated by Dr. [name] as well as [name] with his history of paroxysmal atrial fibrillation.  He has been maintaining sinus rhythm and has continued to be on Xarelto with his CHA2DS2-VASc score of at least 4.  He remained on flecainide 50 mg twice a day and is on bisoprolol 2.5 mg daily.  He is on rosuvastatin 20 mg for hyperlipidemia and is anticoagulated on Xarelto 20 mg.  He is here with his son for follow-up evaluation.   Past Medical History:  Diagnosis Date   A-fib [facility])    Allergy    seasonal allergies   AML (acute myeloid leukemia) (HCC) 04/24/2013   CAD (coronary artery disease)    last cath 2008 with noncritical CAD   GERD (gastroesophageal reflux disease)    Headache 12/17/2012   Hyperlipidemia    Leukemia (HCC)    Other pancytopenia (HCC) 04/09/2013   Small cell lung cancer (HCC) 12/2010   Smoking    Unspecified deficiency anemia 04/16/2013    Past Surgical History:  Procedure Laterality Date   CARDIAC CATHETERIZATION  01/12/2007   noncritical CAD, no change from cath of 05/20/2005. 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   CARDIOVERSION N/A 07/22/2019   Procedure: CARDIOVERSION;  Surgeon: Jake Bathe, MD;  Location: [facility] ENDOSCOPY;  Service: Cardiovascular;  Laterality: N/A;   NM MYOCAR PERF WALL MOTION  01/23/2007   mild perfusion defect seen in the basal inferior and mid inferior regions - c/w attenuation defect. No ischemia present.    Current Medications: Outpatient Medications Prior to Visit  Medication  Sig Dispense Refill   bisoprolol (ZEBETA) 5 MG tablet Take 0.5 tablets (2.5 mg total) by mouth at bedtime. 45 tablet 3   flecainide (TAMBOCOR) 50 MG tablet TAKE 1 TABLET BY MOUTH TWICE A DAY 180 tablet 3   nitroGLYCERIN (NITROSTAT) 0.4 MG SL tablet PLACE 1 TABLET UNDER THE TONGUE EVERY 5 (FIVE) MINUTES AS NEEDED FOR CHEST PAIN. 25 tablet 3   VITAMIN D PO Take 2,000 Units by mouth every other day.      XARELTO 20 MG TABS tablet TAKE 1 TABLET BY MOUTH DAILY WITH SUPPER. 90 tablet 3   rosuvastatin (CRESTOR) 20 MG tablet Take 1 tablet (20 mg total) by mouth daily. 90 tablet 3   No facility-administered medications prior to visit.     Allergies:  Patient has no known allergies.   Social History   Socioeconomic History   Marital status: Married    Spouse name: Not on file   Number of children: Not on file   Years of education: Not on file   Highest education level: Not on file  Occupational History   Not on file  Tobacco Use   Smoking status: Former    Packs/day: 1.00    Years: 50.00    Total pack years: 50.00    Types: Cigarettes    Quit date: 03/28/2006    Years since quitting: 16.0   Smokeless tobacco: Never  Substance and Sexual Activity   Alcohol use: No   Drug use: No   Sexual activity: Not on file  Other Topics Concern   Not on file  Social History Narrative   Not on file   Social Determinants of Health   Financial Resource Strain: Not on file  Food Insecurity: Not on file  Transportation Needs: Not on file  Physical Activity: Not on file  Stress: Not on file  Social Connections: Not on file     Family History:  The patient's family history includes Healthy in his mother and son; Thyroid cancer in his sister.   ROS General: Negative; No fevers, chills, or night sweats;  HEENT: Negative; No changes in vision or hearing, sinus congestion, difficulty swallowing Pulmonary: Negative; No cough, wheezing, shortness of breath, hemoptysis Cardiovascular: Negative; No  chest pain, presyncope, syncope, palpitations GI: Negative; No nausea, vomiting, diarrhea, or abdominal pain GU: Negative; No dysuria, hematuria, or difficulty voiding Musculoskeletal: Negative; no myalgias, joint pain, or weakness Hematologic/Oncology: Negative; no easy bruising, bleeding Endocrine: Negative; no heat/cold intolerance; no diabetes Neuro: Negative; no changes in balance, headaches Skin: Negative; No rashes or skin lesions Psychiatric: Negative; No behavioral problems, depression Sleep: Negative; No snoring, daytime sleepiness, hypersomnolence, bruxism, restless legs, hypnogognic hallucinations, no cataplexy Other comprehensive 14 point system review is negative.   PHYSICAL EXAM:   VS:  BP (!) 144/77   Pulse (!) 55   Ht 5\' 7"  (1.702 m)   Wt 157 lb 6.4 oz (71.4 kg)   SpO2 98%   BMI 24.65 kg/m     Repeat blood pressure by me was 136/76  Wt Readings from Last 3 Encounters:  04/27/22 157 lb 6.4 oz (71.4 kg)  10/27/21 161 lb (73 kg)  06/16/21 158 lb (71.7 kg)    General: Alert, oriented, no distress.  Skin: normal turgor, no rashes, warm and dry HEENT: Normocephalic, atraumatic. Pupils equal round and reactive to light; sclera anicteric; extraocular muscles intact;  Nose without nasal septal hypertrophy Mouth/Parynx benign; Mallinpatti scale 3 Neck: No JVD, no carotid bruits; normal carotid upstroke Lungs: clear to ausculatation and percussion; no wheezing or rales Chest wall: without tenderness to palpitation Heart: PMI not displaced, regular rhythm, bradycardic in the mid 50s, s1 s2 normal, 1-2/6 systolic murmur, no diastolic murmur, no rubs, gallops, thrills, or heaves Abdomen: soft, nontender; no hepatosplenomehaly, BS+; abdominal aorta nontender and not dilated by palpation. Back: no CVA tenderness Pulses 2+ Musculoskeletal: full range of motion, normal strength, no joint deformities Extremities: no clubbing cyanosis or edema, Homan's sign negative   Neurologic: grossly nonfocal; Cranial nerves grossly wnl Psychologic: Normal mood and affect   Studies/Labs Reviewed:   April 19, 2022 ECG (independently read by me): Sinus bradycardia at 55, QTc 451 msec  November 18, 2019  ECG (independently read by me): Sinus bradycardia 51 bpm.  No ectopy.  QTc  interval 433 ms.  Recent Labs:    Latest Ref Rng & Units 11/08/2021    4:57 AM 05/28/2021    9:47 AM 08/30/2020    7:47 PM  BMP  Glucose 70 - 99 mg/dL 409  811  914   BUN 8 - 23 mg/dL 19  17  17    Creatinine 0.61 - 1.24 mg/dL 7.82  9.56  2.13   BUN/Creat Ratio 10 - 24  15    Sodium 135 - 145 mmol/L 137  141  139   Potassium 3.5 - 5.1 mmol/L 4.0  3.8  3.5   Chloride 98 - 111 mmol/L 105  103  108   CO2 22 - 32 mmol/L 23  24  21    Calcium 8.9 - 10.3 mg/dL 8.8  8.6  8.3         Latest Ref Rng & Units 11/08/2021    4:57 AM 08/30/2020    7:47 PM 03/20/2015    9:59 AM  Hepatic Function  Total Protein 6.5 - 8.1 g/dL 6.8  6.6  7.0   Albumin 3.5 - 5.0 g/dL 3.6  3.3  4.0   AST 15 - 41 U/L 26  29  23    ALT 0 - 44 U/L 23  41  30   Alk Phosphatase 38 - 126 U/L 48  34  64   Total Bilirubin 0.3 - 1.2 mg/dL 0.4  0.8  0.4        Latest Ref Rng & Units 11/08/2021    4:57 AM 05/28/2021    9:47 AM 08/30/2020    7:47 PM  CBC  WBC 4.0 - 10.5 K/uL 5.9  5.7  6.4   Hemoglobin 13.0 - 17.0 g/dL 08.6  57.8  46.9   Hematocrit 39.0 - 52.0 % 38.5  39.2  39.5   Platelets 150 - 400 K/uL 127  130  141    Lab Results  Component Value Date   MCV 96.7 11/08/2021   MCV 100 (H) 05/28/2021   MCV 98.8 08/30/2020   Lab Results  Component Value Date   TSH 2.342 03/20/2015   Lab Results  Component Value Date   HGBA1C 5.8 (H) 01/23/2011     BNP    Component Value Date/Time   BNP 175.7 (H) 01/29/2017 0440    ProBNP No results found for: "PROBNP"   Lipid Panel     Component Value Date/Time   CHOL  09/11/2009 0445    121        ATP III CLASSIFICATION:  <200     mg/dL   Desirable  629-528  mg/dL    Borderline High  >=413    mg/dL   High          TRIG 29 09/11/2009 0445   HDL 41 09/11/2009 0445   CHOLHDL 3.0 09/11/2009 0445   VLDL 6 09/11/2009 0445   LDLCALC  09/11/2009 0445    74        Total Cholesterol/HDL:CHD Risk Coronary Heart Disease Risk Table                     Men   Women  1/2 Average Risk   3.4   3.3  Average Risk       5.0   4.4  2 X Average Risk   9.6   7.1  3 X Average Risk  23.4   11.0        Use the calculated Patient Ratio  above and the CHD Risk Table to determine the patient's CHD Risk.        ATP III CLASSIFICATION (LDL):  <100     mg/dL   Optimal  161-096  mg/dL   Near or Above                    Optimal  130-159  mg/dL   Borderline  045-409  mg/dL   High  >811     mg/dL   Very High     RADIOLOGY: No results found.   Additional studies/ records that were reviewed today include:  I reviewed the subsequent evaluations since his last office visit with me.   ASSESSMENT:    1. Paroxysmal atrial fibrillation (HCC)   2. Coronary artery disease involving native coronary artery of native heart with angina pectoris (HCC)   3. Essential hypertension   4. Hyperlipidemia LDL goal <70   5. Chronic anticoagulation     PLAN:  Mr Overton is an 82 year old [place] gentleman who has history of paroxysmal atrial fibrillation, nonobstructive CAD, small cell carcinoma of the lung, hyperlipidemia, as well as documented mild thoracic aortic dilatation.   He developed recurrent atrial fibrillation and required an additional cardioversion  performed on July 22, 2019.  Presently, he has been maintaining sinus rhythm and has been seen by Dr. [name] and [name], [name].  He is in sinus rhythm today on flecainide 50 mg twice a day in addition to bisoprolol 2.5 mg daily.  QTc interval is normal at 451 ms.  He had developed small cell carcinoma and completed therapy.  This has remained stable.  He has documented mild aortic sclerosis without stenosis on  echocardiography with mild to moderate AR.  He is anticoagulated on Xarelto.  He admits that he is sleeping well and feels his sleep is restorative.  He is on rosuvastatin 20 mg daily for hyperlipidemia.  Laboratory on November 24, 2021 showed an LDL of 68.  Creatinine was 1.03.  He will continue current therapy with plan to follow-up with Dr. Susa Raring his primary care.  I will see him in 1 year for reevaluation or sooner as needed.   Medication Adjustments/Labs and Tests Ordered: Current medicines are reviewed at length with the patient today.  Concerns regarding medicines are outlined above.  Medication changes, Labs and Tests ordered today are listed in the Patient Instructions below. Patient Instructions  Medication Instructions:  Your physician recommends that you continue on your current medications as directed. Please refer to the Current Medication list given to you today.  *If you need a refill on your cardiac medications before your next appointment, please call your pharmacy*  Follow-Up: At [facility], you and your health needs are our priority.  As part of our continuing mission to provide you with exceptional heart care, we have created designated Provider Care Teams.  These Care Teams include your primary Cardiologist (physician) and Advanced Practice Providers (APPs -  Physician Assistants and Nurse Practitioners) who all work together to provide you with the care you need, when you need it.  We recommend signing up for the patient portal called "MyChart".  Sign up information is provided on this After Visit Summary.  MyChart is used to connect with patients for Virtual Visits (Telemedicine).  Patients are able to view lab/test results, encounter notes, upcoming appointments, etc.  Non-urgent messages can be sent to your provider as well.   To learn more about what you can do  with MyChart, go to [email or web address].    Your next appointment:   12 month(s)  Provider:    [name], MD         Signed, [name], MD  04/28/2022 3:21 PM    [facility] [address], [place], [place]  [address] Phone: [phone]

## 2022-04-27 NOTE — Patient Instructions (Signed)
Medication Instructions:  Your physician recommends that you continue on your current medications as directed. Please refer to the Current Medication list given to you today.  *If you need a refill on your cardiac medications before your next appointment, please call your pharmacy*  Follow-Up: At [facility], you and your health needs are our priority.  As part of our continuing mission to provide you with exceptional heart care, we have created designated Provider Care Teams.  These Care Teams include your primary Cardiologist (physician) and Advanced Practice Providers (APPs -  Physician Assistants and Nurse Practitioners) who all work together to provide you with the care you need, when you need it.  We recommend signing up for the patient portal called "MyChart".  Sign up information is provided on this After Visit Summary.  MyChart is used to connect with patients for Virtual Visits (Telemedicine).  Patients are able to view lab/test results, encounter notes, upcoming appointments, etc.  Non-urgent messages can be sent to your provider as well.   To learn more about what you can do with MyChart, go to [email or web address].    Your next appointment:   12 month(s)  Provider:   Shelva Majestic, MD

## 2022-04-28 ENCOUNTER — Encounter: Payer: Self-pay | Admitting: Cardiovascular Disease

## 2022-05-03 ENCOUNTER — Other Ambulatory Visit: Payer: Self-pay

## 2022-05-03 ENCOUNTER — Encounter: Payer: Self-pay | Admitting: Orthopaedic Surgery

## 2022-05-04 ENCOUNTER — Ambulatory Visit: Payer: 59 | Admitting: Physician Assistant

## 2022-05-12 ENCOUNTER — Ambulatory Visit: Payer: 59 | Admitting: Orthopaedic Surgery

## 2022-05-13 DIAGNOSIS — E559 Vitamin D deficiency, unspecified: Secondary | ICD-10-CM | POA: Diagnosis not present

## 2022-05-13 DIAGNOSIS — I700 Atherosclerosis of aorta: Secondary | ICD-10-CM | POA: Diagnosis not present

## 2022-05-13 DIAGNOSIS — M810 Age-related osteoporosis without current pathological fracture: Secondary | ICD-10-CM | POA: Diagnosis not present

## 2022-05-13 DIAGNOSIS — D696 Thrombocytopenia, unspecified: Secondary | ICD-10-CM | POA: Diagnosis not present

## 2022-05-13 DIAGNOSIS — R030 Elevated blood-pressure reading, without diagnosis of hypertension: Secondary | ICD-10-CM | POA: Diagnosis not present

## 2022-05-13 DIAGNOSIS — R7303 Prediabetes: Secondary | ICD-10-CM | POA: Diagnosis not present

## 2022-05-13 DIAGNOSIS — I25119 Atherosclerotic heart disease of native coronary artery with unspecified angina pectoris: Secondary | ICD-10-CM | POA: Diagnosis not present

## 2022-05-13 DIAGNOSIS — Z85118 Personal history of other malignant neoplasm of bronchus and lung: Secondary | ICD-10-CM | POA: Diagnosis not present

## 2022-05-13 DIAGNOSIS — D6869 Other thrombophilia: Secondary | ICD-10-CM | POA: Diagnosis not present

## 2022-05-13 DIAGNOSIS — I4891 Unspecified atrial fibrillation: Secondary | ICD-10-CM | POA: Diagnosis not present

## 2022-05-13 DIAGNOSIS — E782 Mixed hyperlipidemia: Secondary | ICD-10-CM | POA: Diagnosis not present

## 2022-05-13 DIAGNOSIS — C9201 Acute myeloblastic leukemia, in remission: Secondary | ICD-10-CM | POA: Diagnosis not present

## 2022-06-28 DIAGNOSIS — Z08 Encounter for follow-up examination after completed treatment for malignant neoplasm: Secondary | ICD-10-CM | POA: Diagnosis not present

## 2022-06-28 DIAGNOSIS — Z87891 Personal history of nicotine dependence: Secondary | ICD-10-CM | POA: Diagnosis not present

## 2022-06-28 DIAGNOSIS — Z9221 Personal history of antineoplastic chemotherapy: Secondary | ICD-10-CM | POA: Diagnosis not present

## 2022-06-28 DIAGNOSIS — J984 Other disorders of lung: Secondary | ICD-10-CM | POA: Diagnosis not present

## 2022-06-28 DIAGNOSIS — Z85118 Personal history of other malignant neoplasm of bronchus and lung: Secondary | ICD-10-CM | POA: Diagnosis not present

## 2022-06-28 DIAGNOSIS — Z923 Personal history of irradiation: Secondary | ICD-10-CM | POA: Diagnosis not present

## 2022-06-28 DIAGNOSIS — C3490 Malignant neoplasm of unspecified part of unspecified bronchus or lung: Secondary | ICD-10-CM | POA: Diagnosis not present

## 2022-06-28 DIAGNOSIS — J069 Acute upper respiratory infection, unspecified: Secondary | ICD-10-CM | POA: Diagnosis not present

## 2022-06-28 DIAGNOSIS — I77810 Thoracic aortic ectasia: Secondary | ICD-10-CM | POA: Diagnosis not present

## 2022-06-28 DIAGNOSIS — C3412 Malignant neoplasm of upper lobe, left bronchus or lung: Secondary | ICD-10-CM | POA: Diagnosis not present

## 2022-06-28 DIAGNOSIS — C9201 Acute myeloblastic leukemia, in remission: Secondary | ICD-10-CM | POA: Diagnosis not present

## 2022-07-14 DIAGNOSIS — C3412 Malignant neoplasm of upper lobe, left bronchus or lung: Secondary | ICD-10-CM | POA: Diagnosis not present

## 2022-07-14 DIAGNOSIS — R1312 Dysphagia, oropharyngeal phase: Secondary | ICD-10-CM | POA: Diagnosis not present

## 2022-07-23 ENCOUNTER — Other Ambulatory Visit: Payer: Self-pay | Admitting: Cardiovascular Disease

## 2022-07-23 DIAGNOSIS — I480 Paroxysmal atrial fibrillation: Secondary | ICD-10-CM

## 2022-07-25 MED ORDER — RIVAROXABAN 15 MG PO TABS: 15.0000 mg | ORAL_TABLET | Freq: Every day | ORAL | 5 refills | Status: DC

## 2022-07-25 NOTE — Telephone Encounter (Signed)
Will send in new rx for Xarelto 15mg  QD. Attempted to notify pt/ daughter LMOM TCB to make aware of dosage change.

## 2022-07-25 NOTE — Telephone Encounter (Signed)
Daughter (on dpr) returned the call and  advised that the xarelto dose was being decreased from Xarelto 20mg  daily to Xarelto 15mg  daily. She is aware that the refill will be sent at this time.  Please refer to previous documentation from [place] and [place] regarding doseage.

## 2022-07-25 NOTE — Telephone Encounter (Signed)
Yes please reduce dose to 15mg  daily

## 2022-07-25 NOTE — Telephone Encounter (Signed)
Pt last saw Dr Tresa Endo 04/27/22, last labs 11/08/21 Creat 1.21, age 82, weight 71.4kg, CrCl 46.71, based on CrCl pt is not on appropriate dosage of Xarelto for afib.  Please review and advise if dosage change appropriate.  Thanks

## 2022-07-27 DIAGNOSIS — R911 Solitary pulmonary nodule: Secondary | ICD-10-CM | POA: Diagnosis not present

## 2022-07-27 DIAGNOSIS — C3412 Malignant neoplasm of upper lobe, left bronchus or lung: Secondary | ICD-10-CM | POA: Diagnosis not present

## 2022-07-27 DIAGNOSIS — Z79899 Other long term (current) drug therapy: Secondary | ICD-10-CM | POA: Diagnosis not present

## 2022-08-18 DIAGNOSIS — M810 Age-related osteoporosis without current pathological fracture: Secondary | ICD-10-CM | POA: Diagnosis not present

## 2022-09-16 DIAGNOSIS — M1711 Unilateral primary osteoarthritis, right knee: Secondary | ICD-10-CM | POA: Diagnosis not present

## 2022-09-16 DIAGNOSIS — M1712 Unilateral primary osteoarthritis, left knee: Secondary | ICD-10-CM | POA: Diagnosis not present

## 2022-09-27 DIAGNOSIS — M1711 Unilateral primary osteoarthritis, right knee: Secondary | ICD-10-CM | POA: Diagnosis not present

## 2022-09-27 DIAGNOSIS — M1712 Unilateral primary osteoarthritis, left knee: Secondary | ICD-10-CM | POA: Diagnosis not present

## 2022-10-14 DIAGNOSIS — M1711 Unilateral primary osteoarthritis, right knee: Secondary | ICD-10-CM | POA: Diagnosis not present

## 2022-10-14 DIAGNOSIS — M1712 Unilateral primary osteoarthritis, left knee: Secondary | ICD-10-CM | POA: Diagnosis not present

## 2022-10-21 ENCOUNTER — Other Ambulatory Visit: Payer: Self-pay | Admitting: Cardiology

## 2022-10-21 ENCOUNTER — Other Ambulatory Visit: Payer: Self-pay | Admitting: Cardiovascular Disease

## 2022-10-21 ENCOUNTER — Other Ambulatory Visit: Payer: Self-pay

## 2022-10-21 ENCOUNTER — Encounter: Payer: Self-pay | Admitting: Cardiovascular Disease

## 2022-10-21 MED ORDER — NITROGLYCERIN 0.4 MG SL SUBL: 0.4000 mg | SUBLINGUAL_TABLET | SUBLINGUAL | 3 refills | Status: DC | PRN

## 2022-11-18 DIAGNOSIS — M1712 Unilateral primary osteoarthritis, left knee: Secondary | ICD-10-CM | POA: Diagnosis not present

## 2022-11-18 DIAGNOSIS — M1711 Unilateral primary osteoarthritis, right knee: Secondary | ICD-10-CM | POA: Diagnosis not present

## 2022-11-20 DIAGNOSIS — M1711 Unilateral primary osteoarthritis, right knee: Secondary | ICD-10-CM | POA: Diagnosis not present

## 2022-11-20 DIAGNOSIS — M1712 Unilateral primary osteoarthritis, left knee: Secondary | ICD-10-CM | POA: Diagnosis not present

## 2022-11-24 ENCOUNTER — Other Ambulatory Visit: Payer: Self-pay | Admitting: Cardiovascular Disease

## 2022-11-25 NOTE — Telephone Encounter (Signed)
Prescription refill request for Xarelto received.  Indication:afib Last office visit:1/24 Weight:71.4  kg Age:82 Scr:1.0  2/24 CrCl:56.53  ml/min  Prescription refilled

## 2022-12-02 DIAGNOSIS — M1712 Unilateral primary osteoarthritis, left knee: Secondary | ICD-10-CM | POA: Diagnosis not present

## 2022-12-02 DIAGNOSIS — M1711 Unilateral primary osteoarthritis, right knee: Secondary | ICD-10-CM | POA: Diagnosis not present

## 2022-12-15 DIAGNOSIS — M1712 Unilateral primary osteoarthritis, left knee: Secondary | ICD-10-CM | POA: Diagnosis not present

## 2022-12-15 DIAGNOSIS — M1711 Unilateral primary osteoarthritis, right knee: Secondary | ICD-10-CM | POA: Diagnosis not present

## 2022-12-20 DIAGNOSIS — Z0000 Encounter for general adult medical examination without abnormal findings: Secondary | ICD-10-CM | POA: Diagnosis not present

## 2022-12-20 DIAGNOSIS — D6869 Other thrombophilia: Secondary | ICD-10-CM | POA: Diagnosis not present

## 2022-12-20 DIAGNOSIS — E559 Vitamin D deficiency, unspecified: Secondary | ICD-10-CM | POA: Diagnosis not present

## 2022-12-20 DIAGNOSIS — E782 Mixed hyperlipidemia: Secondary | ICD-10-CM | POA: Diagnosis not present

## 2022-12-20 DIAGNOSIS — I4891 Unspecified atrial fibrillation: Secondary | ICD-10-CM | POA: Diagnosis not present

## 2022-12-20 DIAGNOSIS — C9201 Acute myeloblastic leukemia, in remission: Secondary | ICD-10-CM | POA: Diagnosis not present

## 2022-12-20 DIAGNOSIS — D696 Thrombocytopenia, unspecified: Secondary | ICD-10-CM | POA: Diagnosis not present

## 2022-12-20 DIAGNOSIS — R7303 Prediabetes: Secondary | ICD-10-CM | POA: Diagnosis not present

## 2022-12-20 DIAGNOSIS — M810 Age-related osteoporosis without current pathological fracture: Secondary | ICD-10-CM | POA: Diagnosis not present

## 2022-12-20 DIAGNOSIS — Z23 Encounter for immunization: Secondary | ICD-10-CM | POA: Diagnosis not present

## 2022-12-20 DIAGNOSIS — I25119 Atherosclerotic heart disease of native coronary artery with unspecified angina pectoris: Secondary | ICD-10-CM | POA: Diagnosis not present

## 2022-12-22 ENCOUNTER — Other Ambulatory Visit: Payer: Self-pay | Admitting: Family Medicine

## 2022-12-22 DIAGNOSIS — M810 Age-related osteoporosis without current pathological fracture: Secondary | ICD-10-CM

## 2023-01-13 DIAGNOSIS — M1712 Unilateral primary osteoarthritis, left knee: Secondary | ICD-10-CM | POA: Diagnosis not present

## 2023-01-13 DIAGNOSIS — M1711 Unilateral primary osteoarthritis, right knee: Secondary | ICD-10-CM | POA: Diagnosis not present

## 2023-01-21 ENCOUNTER — Other Ambulatory Visit: Payer: Self-pay | Admitting: Cardiovascular Disease

## 2023-01-21 DIAGNOSIS — I480 Paroxysmal atrial fibrillation: Secondary | ICD-10-CM

## 2023-01-23 NOTE — Telephone Encounter (Signed)
Prescription refill request for Xarelto received.  Indication:afib Last office visit:1/24 Weight:71.4  kg Age:82 Scr:1.21  2023 CrCl:46.71  ml/min  Prescription refilled

## 2023-01-31 DIAGNOSIS — M1711 Unilateral primary osteoarthritis, right knee: Secondary | ICD-10-CM | POA: Diagnosis not present

## 2023-01-31 DIAGNOSIS — M1712 Unilateral primary osteoarthritis, left knee: Secondary | ICD-10-CM | POA: Diagnosis not present

## 2023-02-03 ENCOUNTER — Ambulatory Visit
Admission: RE | Admit: 2023-02-03 | Discharge: 2023-02-03 | Disposition: A | Discharge status: Home / Self Care | Payer: 59 | Source: Ambulatory Visit | Attending: Family Medicine | Admitting: Family Medicine

## 2023-02-03 DIAGNOSIS — R29890 Loss of height: Secondary | ICD-10-CM | POA: Diagnosis not present

## 2023-02-03 DIAGNOSIS — M810 Age-related osteoporosis without current pathological fracture: Secondary | ICD-10-CM

## 2023-02-03 DIAGNOSIS — M8588 Other specified disorders of bone density and structure, other site: Secondary | ICD-10-CM | POA: Diagnosis not present

## 2023-02-06 ENCOUNTER — Telehealth: Payer: Self-pay | Admitting: Cardiovascular Disease

## 2023-02-06 ENCOUNTER — Emergency Department (HOSPITAL_BASED_OUTPATIENT_CLINIC_OR_DEPARTMENT_OTHER): Payer: 59

## 2023-02-06 ENCOUNTER — Other Ambulatory Visit: Payer: Self-pay

## 2023-02-06 ENCOUNTER — Inpatient Hospital Stay (HOSPITAL_BASED_OUTPATIENT_CLINIC_OR_DEPARTMENT_OTHER)
Admission: EM | Admit: 2023-02-06 | Discharge: 2023-02-09 | DRG: 200 | Disposition: A | Discharge status: Home / Self Care | Payer: 59 | Attending: Internal Medicine | Admitting: Internal Medicine

## 2023-02-06 ENCOUNTER — Emergency Department (HOSPITAL_BASED_OUTPATIENT_CLINIC_OR_DEPARTMENT_OTHER): Payer: 59 | Admitting: Radiology

## 2023-02-06 ENCOUNTER — Encounter (HOSPITAL_BASED_OUTPATIENT_CLINIC_OR_DEPARTMENT_OTHER): Payer: Self-pay | Admitting: Emergency Medicine

## 2023-02-06 DIAGNOSIS — Z9221 Personal history of antineoplastic chemotherapy: Secondary | ICD-10-CM

## 2023-02-06 DIAGNOSIS — Z8679 Personal history of other diseases of the circulatory system: Secondary | ICD-10-CM | POA: Diagnosis not present

## 2023-02-06 DIAGNOSIS — Z7901 Long term (current) use of anticoagulants: Secondary | ICD-10-CM | POA: Diagnosis not present

## 2023-02-06 DIAGNOSIS — C9200 Acute myeloblastic leukemia, not having achieved remission: Secondary | ICD-10-CM | POA: Diagnosis not present

## 2023-02-06 DIAGNOSIS — Q909 Down syndrome, unspecified: Secondary | ICD-10-CM | POA: Diagnosis not present

## 2023-02-06 DIAGNOSIS — R918 Other nonspecific abnormal finding of lung field: Secondary | ICD-10-CM | POA: Diagnosis not present

## 2023-02-06 DIAGNOSIS — Z85118 Personal history of other malignant neoplasm of bronchus and lung: Secondary | ICD-10-CM | POA: Diagnosis not present

## 2023-02-06 DIAGNOSIS — K219 Gastro-esophageal reflux disease without esophagitis: Secondary | ICD-10-CM | POA: Diagnosis not present

## 2023-02-06 DIAGNOSIS — J939 Pneumothorax, unspecified: Secondary | ICD-10-CM | POA: Diagnosis present

## 2023-02-06 DIAGNOSIS — J9383 Other pneumothorax: Secondary | ICD-10-CM | POA: Diagnosis not present

## 2023-02-06 DIAGNOSIS — E785 Hyperlipidemia, unspecified: Secondary | ICD-10-CM | POA: Diagnosis present

## 2023-02-06 DIAGNOSIS — Z79899 Other long term (current) drug therapy: Secondary | ICD-10-CM | POA: Diagnosis not present

## 2023-02-06 DIAGNOSIS — E7849 Other hyperlipidemia: Secondary | ICD-10-CM | POA: Diagnosis not present

## 2023-02-06 DIAGNOSIS — I2510 Atherosclerotic heart disease of native coronary artery without angina pectoris: Secondary | ICD-10-CM | POA: Diagnosis present

## 2023-02-06 DIAGNOSIS — Z87891 Personal history of nicotine dependence: Secondary | ICD-10-CM | POA: Diagnosis not present

## 2023-02-06 DIAGNOSIS — I480 Paroxysmal atrial fibrillation: Secondary | ICD-10-CM | POA: Diagnosis not present

## 2023-02-06 DIAGNOSIS — R0789 Other chest pain: Secondary | ICD-10-CM | POA: Diagnosis not present

## 2023-02-06 DIAGNOSIS — J9811 Atelectasis: Secondary | ICD-10-CM | POA: Diagnosis present

## 2023-02-06 DIAGNOSIS — Z808 Family history of malignant neoplasm of other organs or systems: Secondary | ICD-10-CM | POA: Diagnosis not present

## 2023-02-06 DIAGNOSIS — Z923 Personal history of irradiation: Secondary | ICD-10-CM

## 2023-02-06 DIAGNOSIS — I10 Essential (primary) hypertension: Secondary | ICD-10-CM | POA: Diagnosis present

## 2023-02-06 DIAGNOSIS — Z4682 Encounter for fitting and adjustment of non-vascular catheter: Secondary | ICD-10-CM | POA: Diagnosis not present

## 2023-02-06 DIAGNOSIS — J984 Other disorders of lung: Secondary | ICD-10-CM | POA: Diagnosis not present

## 2023-02-06 DIAGNOSIS — D71 Functional disorders of polymorphonuclear neutrophils: Secondary | ICD-10-CM | POA: Diagnosis not present

## 2023-02-06 DIAGNOSIS — Z856 Personal history of leukemia: Secondary | ICD-10-CM

## 2023-02-06 DIAGNOSIS — R079 Chest pain, unspecified: Secondary | ICD-10-CM | POA: Diagnosis not present

## 2023-02-06 LAB — BASIC METABOLIC PANEL
Anion gap: 7 (ref 5–15)
BUN: 19 mg/dL (ref 8–23)
CO2: 27 mmol/L (ref 22–32)
Calcium: 9.4 mg/dL (ref 8.9–10.3)
Chloride: 104 mmol/L (ref 98–111)
Creatinine, Ser: 1.18 mg/dL (ref 0.61–1.24)
GFR, Estimated: >60 mL/min (ref >60)
Glucose, Bld: 135 mg/dL — ABNORMAL HIGH (ref 70–99)
Potassium: 4 mmol/L (ref 3.5–5.1)
Sodium: 138 mmol/L (ref 135–145)

## 2023-02-06 LAB — CBC
HCT: 39.5 % (ref 39.0–52.0)
Hemoglobin: 13.7 g/dL (ref 13.0–17.0)
MCH: 33.2 pg (ref 26.0–34.0)
MCHC: 34.7 g/dL (ref 30.0–36.0)
MCV: 95.6 fL (ref 80.0–100.0)
Platelets: 137 10*3/uL — ABNORMAL LOW (ref 150–400)
RBC: 4.13 MIL/uL — ABNORMAL LOW (ref 4.22–5.81)
RDW: 13 % (ref 11.5–15.5)
WBC: 5.2 10*3/uL (ref 4.0–10.5)
nRBC: 0 % (ref 0.0–0.2)

## 2023-02-06 LAB — TROPONIN I (HIGH SENSITIVITY)
Troponin I (High Sensitivity): 4 ng/L (ref <18)
Troponin I (High Sensitivity): 5 ng/L (ref <18)

## 2023-02-06 MED ORDER — FENTANYL CITRATE PF 50 MCG/ML IJ SOSY
50.0000 ug | PREFILLED_SYRINGE | Freq: Once | INTRAMUSCULAR | Status: AC
  Administered 2023-02-06: 50 ug via INTRAVENOUS
  Filled 2023-02-06: qty 1

## 2023-02-06 MED ORDER — LIDOCAINE HCL (PF) 1 % IJ SOLN
5.0000 mL | Freq: Once | INTRAMUSCULAR | Status: DC
  Filled 2023-02-06 (×2): qty 5

## 2023-02-06 MED ORDER — MORPHINE SULFATE (PF) 4 MG/ML IV SOLN
4.0000 mg | Freq: Once | INTRAVENOUS | Status: AC
  Administered 2023-02-06: 4 mg via INTRAVENOUS
  Filled 2023-02-06: qty 1

## 2023-02-06 MED ORDER — BUPIVACAINE HCL (PF) 0.25 % IJ SOLN
10.0000 mL | Freq: Once | INTRAMUSCULAR | Status: DC
  Filled 2023-02-06: qty 30

## 2023-02-06 NOTE — ED Provider Notes (Signed)
  Physical Exam  BP (!) 152/78   Pulse 68   Temp 98.2 F (36.8 C)   Resp (!) 21   Wt 71.7 kg   SpO2 100%   BMI 24.75 kg/m   Physical Exam  Procedures  Procedures  ED Course / MDM   Clinical Course as of 02/06/23 1840  Mon Feb 06, 2023  1356 I received call from radiology, patient has large pneumothorax. [VK]  1536 Pigtail catheter appears to be in appropriate position on CXR. Patient signed out to Dr. Andria Meuse pending hospitalist admission. [VK]    Clinical Course User Index [VK] Rexford Maus, DO   Medical Decision Making Amount and/or Complexity of Data Reviewed Labs: ordered. Radiology: ordered.  Risk Prescription drug management. Decision regarding hospitalization.   Wardell Honour, have assumed care for this patient.  In brief 82 year old male with spontaneous pneumothorax, resolved following chest tube.  Patient was still endorsing pain, when assessed the area.  Looks as though he might have a small amount of air in the chest wall.  No crepitus.  Breath sounds good.  Did a repeat chest x-ray which showed no changes.  Patient still pending transfer.  Stable.       Anders Simmonds T, DO 02/06/23 [phone]

## 2023-02-06 NOTE — ED Triage Notes (Signed)
Pt bib daughter who provides information. Reports LT side, non radiating CP describes as heaviness x 3 days. Used nitroglycerin x 3 on Sat,, nitroglycerin x 2 yesterday. Denies n/v. Pt had salanpas patches on in triage.

## 2023-02-06 NOTE — ED Notes (Addendum)
Chest Tube inspected... No obvious bleeding around site... No additional pain or SOB... Pain rated 7/10... Suction at 30 INT (Confirmed with MD)... Pt stated that his breathing has improved... Provider informed.Marland KitchenMarland Kitchen

## 2023-02-06 NOTE — ED Notes (Signed)
Son "Sherilyn Cooter" (Name Son wanted to be called) - [phone].

## 2023-02-06 NOTE — Telephone Encounter (Signed)
Spoke with patients daughter Zavior, [place]) who reports patient still  currently experiencing  mild chest pressure.  Daughter states " dad usually likes to see the doctor because the emergency room never does anything". Patient has intermittent  CP that started 11/09. Took 3 doses of nitroglycerin on 11/9 and felt some relief but CP returned on 11/10. Pt took another two doses of nitroglycerin on 11/10.  NO SOB, nausea, extremity numbness/tingling, vision changes or sweating reported at this time by patient. Recommended patient be taken to ER or call EMS as daughter said she was not physically present  with her father at the time of our phone call.  Daughter verbalized understanding. No further questions at this time.

## 2023-02-06 NOTE — Progress Notes (Signed)
Plan of Care Note for accepted transfer   Patient: REES FATEMI MRN: [id number]   DOA: 02/06/2023  Facility requesting transfer: [facility] Requesting Provider: Dr Anders Simmonds Reason for transfer: Large pneumothorax Facility course:  82 year old male with past medical history significant for A-fib on Xarelto, AML and lung cancer status post chemoradiation, presents to the ED complaining of left-sided chest pain.  Found to have large left-sided pneumothorax status post chest tube placement by EDP.  In the ED, patient was satting 91% on room air with no acute respiratory distress, was placed on a nonrebreather.  Otherwise stable.  Labs fairly unremarkable including troponin which was 4.  Discussed with EDP Dr. Andria Meuse about consulting pulmonology for management of chest tube upon arrival to [facility].  Patient receives his oncology care from [facility].  Currently not on chemo or radiation.   Plan of care: The patient is accepted for admission to Progressive unit, at [facility]..    Author: Briant Cedar, MD 02/06/2023  Check [email or web address] for on-call coverage.  Nursing staff, Please call [facility] Admits & Consults System-Wide number on Amion as soon as patient's arrival, so appropriate admitting provider can evaluate the pt.

## 2023-02-06 NOTE — Telephone Encounter (Signed)
Pt c/o of Chest Pain: STAT if CP now or developed within 24 hours  1. Are you having CP right now?  Daughter states patient denies CP, but he is currently having chest tightness  2. Are you experiencing any other symptoms (ex. SOB, nausea, vomiting, sweating)?  No   3. How long have you been experiencing CP?  Tightness started on Saturday, 11/09  4. Is your CP continuous or coming and going?  Continuous   5. Have you taken Nitroglycerin?  Yes, when tightness started on Saturday patient took nitroglycerin every 5 minutes (3 total) until he got better. Daughter states he felt better gradually but was not 100%. He still felt the tightness yesterday and took 2 more nitro, and he's still having symptoms today.  ?

## 2023-02-06 NOTE — ED Notes (Signed)
Placed on [place] at 3/L O2 to be able to eat.

## 2023-02-06 NOTE — ED Notes (Signed)
Thomas with cl called for transport

## 2023-02-06 NOTE — ED Notes (Signed)
Report given to the next RN.Marland KitchenMarland Kitchen

## 2023-02-06 NOTE — ED Notes (Signed)
Chest Tube inspected... No obvious bleeding around site... No additional pain or SOB... Pain rated 7/10... Suction at 30 INT (Confirmed with MD)... Pt stated that his breathing has improved... Provider informed.Marland KitchenMarland Kitchen

## 2023-02-06 NOTE — ED Notes (Signed)
Pt not on RA, Pt on O2... Miss click on the chart.

## 2023-02-06 NOTE — ED Provider Notes (Signed)
EMERGENCY DEPARTMENT AT [facility] Provider Note   CSN: [id number] Arrival date & time: 02/06/23  1255     History  Chief Complaint  Patient presents with   Chest Pain    Gerald [name] is a 82 y.o. male.  Patient is an 82 year old male with a past medical history of A-fib on Xarelto, AML and lung cancer status post chemo and radiation, CAD, GERD presenting to the emergency department for chest pain.  Patient is here with his daughter who reports that he started to have left-sided chest pain on Saturday, reports that the pain feels like a heaviness.  He tried to take his nitro at home without significant relief.  He denies significant shortness of breath.  She reports that he does often choke on his food and will cough a lot but has not had any other cough.  Denies any fever or lower extremity swelling.  The history is provided by the patient and a relative. No language interpreter was used (Patient declined, requested daughter to interpret).  Chest Pain      Home Medications Prior to Admission medications   Medication Sig Start Date End Date Taking? Authorizing Provider  bisoprolol (ZEBETA) 5 MG tablet Take 0.5 tablets (2.5 mg total) by mouth at bedtime. 02/24/22   Graciella Freer, [name]  flecainide (TAMBOCOR) 50 MG tablet TAKE 1 TABLET BY MOUTH TWICE A DAY 10/21/22   Lennette Bihari, MD  nitroGLYCERIN (NITROSTAT) 0.4 MG SL tablet Place 1 tablet (0.4 mg total) under the tongue every 5 (five) minutes as needed for chest pain. 10/21/22   Lennette Bihari, MD  rosuvastatin (CRESTOR) 20 MG tablet Take 1 tablet (20 mg total) by mouth daily. 05/06/20 05/28/21  [name], Ovidio Kin., [name]  VITAMIN D PO Take 2,000 Units by mouth every other day.     [provider]  XARELTO 15 MG TABS tablet TAKE 1 TABLET (15 MG TOTAL) BY MOUTH DAILY WITH SUPPER 01/23/23   Lennette Bihari, MD  XARELTO 20 MG TABS tablet TAKE 1 TABLET BY MOUTH DAILY WITH SUPPER 11/25/22   Lennette Bihari, MD      Allergies    Patient has no known allergies.    Review of Systems   Review of Systems  Cardiovascular:  Positive for chest pain.    Physical Exam Updated Vital Signs BP (!) 145/80   Pulse 66   Temp 98.2 F (36.8 C)   Resp (!) 24   Wt 71.7 kg   SpO2 91%   BMI 24.75 kg/m  Physical Exam Vitals and nursing note reviewed.  Constitutional:      General: He is not in acute distress.    Appearance: He is well-developed.  HENT:     Head: Normocephalic and atraumatic.  Eyes:     Extraocular Movements: Extraocular movements intact.  Cardiovascular:     Rate and Rhythm: Normal rate and regular rhythm.     Heart sounds: Normal heart sounds.  Pulmonary:     Effort: Pulmonary effort is normal.     Breath sounds: Examination of the right-upper field reveals decreased breath sounds. Examination of the right-middle field reveals decreased breath sounds. Examination of the right-lower field reveals decreased breath sounds. Decreased breath sounds present.  Abdominal:     Palpations: Abdomen is soft.     Tenderness: There is no abdominal tenderness.  Musculoskeletal:        General: Normal range of motion.  Cervical back: Normal range of motion and neck supple.     Right lower leg: No edema.     Left lower leg: No edema.  Skin:    General: Skin is warm and dry.  Neurological:     General: No focal deficit present.     Mental Status: He is alert and oriented to person, place, and time.  Psychiatric:        Mood and Affect: Mood normal.        Behavior: Behavior normal.     ED Results / Procedures / Treatments   Labs (all labs ordered are listed, but only abnormal results are displayed) Labs Reviewed  BASIC METABOLIC PANEL - Abnormal; Notable for the following components:      Result Value   Glucose, Bld 135 (*)    All other components within normal limits  CBC - Abnormal; Notable for the following components:   RBC 4.13 (*)    Platelets 137 (*)     All other components within normal limits  TROPONIN I (HIGH SENSITIVITY)  TROPONIN I (HIGH SENSITIVITY)    EKG EKG Interpretation Date/Time:  Monday February 06 2023 13:51:07 EST Ventricular Rate:  66 PR Interval:  193 QRS Duration:  97 QT Interval:  398 QTC Calculation: 417 R Axis:   85  Text Interpretation: Sinus rhythm Borderline right axis deviation Since last tracing of earlier today No significant change was found Confirmed by Elayne Snare ([id number]) on 02/06/2023 2:47:12 PM  Radiology DG Chest 2 View  Result Date: 02/06/2023 CLINICAL DATA:  Chest pain EXAM: CHEST - 2 VIEW COMPARISON:  08/30/2020 x-ray FINDINGS: Large left pneumothorax identified with volume loss and presumed atelectasis. No left-sided effusion. Normal cardiopericardial silhouette calcified aorta. Hyperinflation of the right lung with some tiny dense nodules, unchanged from previous, possibly sequela of old granulomatous disease. No right-sided effusion, pneumothorax or edema. Critical Value/emergent results were called by telephone at the time of interpretation on 02/06/2023 at 10:51 am to provider [facility] , who verbally acknowledged these results. IMPRESSION: Large left pneumothorax. Electronically Signed   By: Karen Kays M.D.   On: 02/06/2023 13:57    Procedures CHEST TUBE INSERTION  Date/Time: 02/06/2023 3:21 PM  Performed by: Rexford Maus, DO Authorized by: Rexford Maus, DO   Consent:    Consent obtained:  Verbal   Consent given by:  Patient   Risks, benefits, and alternatives were discussed: yes     Risks discussed:  Bleeding, damage to surrounding structures, infection, nerve damage, pain and incomplete drainage   Alternatives discussed:  No treatment and delayed treatment Universal protocol:    Procedure explained and questions answered to patient or proxy's satisfaction: yes     Patient identity confirmed:  Verbally with patient Pre-procedure details:    Skin  preparation:  Chlorhexidine   Preparation: Patient was prepped and draped in the usual sterile fashion   Sedation:    Sedation type:  None Anesthesia:    Anesthesia method:  Local infiltration   Local anesthetic:  Lidocaine 1% w/o epi Procedure details:    Placement location:  L lateral   Scalpel size:  11   Tube size ([place]): 14.   Dissection instrument: Pigtail, seldinger technique.   Ultrasound guidance: no     Tension pneumothorax: no     Tube connected to:  Water seal   Drainage characteristics:  Air only   Suture material:  0 silk   Dressing:  4x4 sterile gauze Post-procedure details:  Post-insertion x-ray findings: tube in good position     Procedure completion:  Tolerated well, no immediate complications     Medications Ordered in ED Medications  lidocaine (PF) (XYLOCAINE) 1 % injection 5 mL (has no administration in time range)  morphine (PF) 4 MG/ML injection 4 mg (has no administration in time range)  morphine (PF) 4 MG/ML injection 4 mg (4 mg Intravenous Given 02/06/23 1447)    ED Course/ Medical Decision Making/ A&P Clinical Course as of 02/06/23 1540  Mon Feb 06, 2023  1356 I received call from radiology, patient has large pneumothorax. [VK]  1536 Pigtail catheter appears to be in appropriate position on CXR. Patient signed out to Dr. Andria Meuse pending hospitalist admission. [VK]    Clinical Course User Index [VK] Rexford Maus, DO                                 Medical Decision Making This patient presents to the ED with chief complaint(s) of chest pain with pertinent past medical history of a fib on Xarelto, GERD, CAD which further complicates the presenting complaint. The complaint involves an extensive differential diagnosis and also carries with it a high risk of complications and morbidity.    The differential diagnosis includes ACS, arrhythmia, anemia, pneumonia, pneumothorax, pulmonary edema, pleural effusion, gastritis, GERD, MSK  pain  Additional history obtained: Additional history obtained from family Records reviewed outpatient cardiology records  ED Course and Reassessment: On patient's arrival to the emergency department he was mildly hypoxic, otherwise hemodynamically stable in no acute distress.  He was initially evaluated in triage and had EKG, labs and chest x-ray performed.  Patient's EKG showed normal sinus rhythm without acute ischemic changes.  I received a call from radiology that the patient did have a large left-sided pneumothorax.  Patient was satting 91% on room air no acute respiratory distress.  He was placed on nonrebreather.  The patient will require chest tube placement.  Labs are pending at this time.  Independent labs interpretation:  The following labs were independently interpreted: within normal range  Independent visualization of imaging: - I independently visualized the following imaging with scope of interpretation limited to determining acute life threatening conditions related to emergency care: CXR, which revealed Large L-sided pneumothorax  Consultation: - Consulted or discussed management/test interpretation w/ external professional: hospitalist  Consideration for admission or further workup: patient requires admission for management of his pneumothorax Social Determinants of health: N/A    Amount and/or Complexity of Data Reviewed Labs: ordered. Radiology: ordered.  Risk Prescription drug management. Decision regarding hospitalization.          Final Clinical Impression(s) / ED Diagnoses Final diagnoses:  Spontaneous pneumothorax    Rx / DC Orders ED Discharge Orders     None         Rexford Maus, DO 02/06/23 1540

## 2023-02-07 ENCOUNTER — Encounter (HOSPITAL_COMMUNITY): Payer: Self-pay | Admitting: Internal Medicine

## 2023-02-07 DIAGNOSIS — Z9221 Personal history of antineoplastic chemotherapy: Secondary | ICD-10-CM | POA: Diagnosis not present

## 2023-02-07 DIAGNOSIS — I480 Paroxysmal atrial fibrillation: Secondary | ICD-10-CM | POA: Diagnosis present

## 2023-02-07 DIAGNOSIS — D71 Functional disorders of polymorphonuclear neutrophils: Secondary | ICD-10-CM | POA: Diagnosis present

## 2023-02-07 DIAGNOSIS — Z923 Personal history of irradiation: Secondary | ICD-10-CM | POA: Diagnosis not present

## 2023-02-07 DIAGNOSIS — I10 Essential (primary) hypertension: Secondary | ICD-10-CM | POA: Diagnosis present

## 2023-02-07 DIAGNOSIS — K219 Gastro-esophageal reflux disease without esophagitis: Secondary | ICD-10-CM | POA: Insufficient documentation

## 2023-02-07 DIAGNOSIS — Z8679 Personal history of other diseases of the circulatory system: Secondary | ICD-10-CM | POA: Diagnosis not present

## 2023-02-07 DIAGNOSIS — Z85118 Personal history of other malignant neoplasm of bronchus and lung: Secondary | ICD-10-CM | POA: Diagnosis not present

## 2023-02-07 DIAGNOSIS — I2510 Atherosclerotic heart disease of native coronary artery without angina pectoris: Secondary | ICD-10-CM | POA: Diagnosis present

## 2023-02-07 DIAGNOSIS — E785 Hyperlipidemia, unspecified: Secondary | ICD-10-CM | POA: Diagnosis present

## 2023-02-07 DIAGNOSIS — Q909 Down syndrome, unspecified: Secondary | ICD-10-CM

## 2023-02-07 DIAGNOSIS — J939 Pneumothorax, unspecified: Secondary | ICD-10-CM | POA: Diagnosis not present

## 2023-02-07 DIAGNOSIS — J9383 Other pneumothorax: Secondary | ICD-10-CM | POA: Diagnosis present

## 2023-02-07 DIAGNOSIS — E7849 Other hyperlipidemia: Secondary | ICD-10-CM

## 2023-02-07 DIAGNOSIS — J9811 Atelectasis: Secondary | ICD-10-CM | POA: Diagnosis present

## 2023-02-07 DIAGNOSIS — R918 Other nonspecific abnormal finding of lung field: Secondary | ICD-10-CM | POA: Diagnosis not present

## 2023-02-07 DIAGNOSIS — C9200 Acute myeloblastic leukemia, not having achieved remission: Secondary | ICD-10-CM | POA: Diagnosis not present

## 2023-02-07 DIAGNOSIS — J90 Pleural effusion, not elsewhere classified: Secondary | ICD-10-CM | POA: Diagnosis not present

## 2023-02-07 DIAGNOSIS — Z808 Family history of malignant neoplasm of other organs or systems: Secondary | ICD-10-CM | POA: Diagnosis not present

## 2023-02-07 DIAGNOSIS — Z79899 Other long term (current) drug therapy: Secondary | ICD-10-CM | POA: Diagnosis not present

## 2023-02-07 DIAGNOSIS — Z7901 Long term (current) use of anticoagulants: Secondary | ICD-10-CM | POA: Diagnosis not present

## 2023-02-07 DIAGNOSIS — Z856 Personal history of leukemia: Secondary | ICD-10-CM

## 2023-02-07 DIAGNOSIS — Z87891 Personal history of nicotine dependence: Secondary | ICD-10-CM | POA: Diagnosis not present

## 2023-02-07 DIAGNOSIS — Z4682 Encounter for fitting and adjustment of non-vascular catheter: Secondary | ICD-10-CM | POA: Diagnosis not present

## 2023-02-07 LAB — COMPREHENSIVE METABOLIC PANEL
ALT: 18 U/L (ref 0–44)
AST: 18 U/L (ref 15–41)
Albumin: 3.3 g/dL — ABNORMAL LOW (ref 3.5–5.0)
Alkaline Phosphatase: 37 U/L — ABNORMAL LOW (ref 38–126)
Anion gap: 8 (ref 5–15)
BUN: 18 mg/dL (ref 8–23)
CO2: 24 mmol/L (ref 22–32)
Calcium: 8.8 mg/dL — ABNORMAL LOW (ref 8.9–10.3)
Chloride: 106 mmol/L (ref 98–111)
Creatinine, Ser: 1.14 mg/dL (ref 0.61–1.24)
GFR, Estimated: >60 mL/min (ref >60)
Glucose, Bld: 101 mg/dL — ABNORMAL HIGH (ref 70–99)
Potassium: 3.8 mmol/L (ref 3.5–5.1)
Sodium: 138 mmol/L (ref 135–145)
Total Bilirubin: 0.7 mg/dL (ref <1.2)
Total Protein: 6.9 g/dL (ref 6.5–8.1)

## 2023-02-07 LAB — CBC
HCT: 39.8 % (ref 39.0–52.0)
Hemoglobin: 13.6 g/dL (ref 13.0–17.0)
MCH: 33.1 pg (ref 26.0–34.0)
MCHC: 34.2 g/dL (ref 30.0–36.0)
MCV: 96.8 fL (ref 80.0–100.0)
Platelets: 126 10*3/uL — ABNORMAL LOW (ref 150–400)
RBC: 4.11 MIL/uL — ABNORMAL LOW (ref 4.22–5.81)
RDW: 12.9 % (ref 11.5–15.5)
WBC: 6.4 10*3/uL (ref 4.0–10.5)
nRBC: 0 % (ref 0.0–0.2)

## 2023-02-07 MED ORDER — ONDANSETRON HCL 4 MG PO TABS: 4.0000 mg | ORAL_TABLET | Freq: Four times a day (QID) | ORAL | Status: DC | PRN

## 2023-02-07 MED ORDER — RIVAROXABAN 20 MG PO TABS: 20.0000 mg | ORAL_TABLET | Freq: Every day | ORAL | Status: DC

## 2023-02-07 MED ORDER — ROSUVASTATIN CALCIUM 20 MG PO TABS
20.0000 mg | ORAL_TABLET | Freq: Every day | ORAL | Status: DC
  Administered 2023-02-07 – 2023-02-09 (×3): 20 mg via ORAL
  Filled 2023-02-07 (×3): qty 1

## 2023-02-07 MED ORDER — MORPHINE SULFATE (PF) 2 MG/ML IV SOLN: 2.0000 mg | INTRAVENOUS | Status: DC | PRN

## 2023-02-07 MED ORDER — SODIUM CHLORIDE 0.9% FLUSH: 3.0000 mL | INTRAVENOUS | Status: DC | PRN

## 2023-02-07 MED ORDER — ACETAMINOPHEN 650 MG RE SUPP: 650.0000 mg | Freq: Four times a day (QID) | RECTAL | Status: DC | PRN

## 2023-02-07 MED ORDER — MORPHINE SULFATE (PF) 2 MG/ML IV SOLN: 1.0000 mg | INTRAVENOUS | Status: DC | PRN

## 2023-02-07 MED ORDER — ACETAMINOPHEN 325 MG PO TABS
650.0000 mg | ORAL_TABLET | Freq: Four times a day (QID) | ORAL | Status: DC | PRN
  Administered 2023-02-08: 650 mg via ORAL
  Filled 2023-02-07: qty 2

## 2023-02-07 MED ORDER — SENNOSIDES-DOCUSATE SODIUM 8.6-50 MG PO TABS: 1.0000 | ORAL_TABLET | Freq: Every evening | ORAL | Status: DC | PRN

## 2023-02-07 MED ORDER — SODIUM CHLORIDE 0.9% FLUSH
10.0000 mL | Freq: Three times a day (TID) | INTRAVENOUS | Status: DC
  Administered 2023-02-07 – 2023-02-08 (×4): 10 mL via INTRAPLEURAL

## 2023-02-07 MED ORDER — SODIUM CHLORIDE 0.9% FLUSH
3.0000 mL | Freq: Two times a day (BID) | INTRAVENOUS | Status: DC
  Administered 2023-02-07 – 2023-02-09 (×5): 3 mL via INTRAVENOUS

## 2023-02-07 MED ORDER — FLECAINIDE ACETATE 50 MG PO TABS
50.0000 mg | ORAL_TABLET | Freq: Two times a day (BID) | ORAL | Status: DC
  Administered 2023-02-07 – 2023-02-09 (×5): 50 mg via ORAL
  Filled 2023-02-07 (×6): qty 1

## 2023-02-07 MED ORDER — BISOPROLOL FUMARATE 5 MG PO TABS
2.5000 mg | ORAL_TABLET | Freq: Every day | ORAL | Status: DC
  Administered 2023-02-07 – 2023-02-08 (×2): 2.5 mg via ORAL
  Filled 2023-02-07 (×2): qty 0.5

## 2023-02-07 MED ORDER — SODIUM CHLORIDE 0.9 % IV SOLN: 250.0000 mL | INTRAVENOUS | Status: AC | PRN

## 2023-02-07 MED ORDER — RIVAROXABAN 15 MG PO TABS
15.0000 mg | ORAL_TABLET | Freq: Every day | ORAL | Status: DC
  Administered 2023-02-07 – 2023-02-09 (×4): 15 mg via ORAL
  Filled 2023-02-07 (×4): qty 1

## 2023-02-07 MED ORDER — ONDANSETRON HCL 4 MG/2ML IJ SOLN: 4.0000 mg | Freq: Four times a day (QID) | INTRAMUSCULAR | Status: DC | PRN

## 2023-02-07 MED ORDER — MORPHINE SULFATE (PF) 4 MG/ML IV SOLN
4.0000 mg | Freq: Once | INTRAVENOUS | Status: AC
  Administered 2023-02-07: 4 mg via INTRAVENOUS
  Filled 2023-02-07: qty 1

## 2023-02-07 NOTE — Progress Notes (Signed)
PROGRESS NOTE        PATIENT DETAILS Name: Gerald [name] Age: 82 y.o. Sex: male Date of Birth: 1940/06/11 Admit Date: 02/06/2023 Admitting Physician Briant Cedar, MD WUJ:[id number], Onalee Hua, MD  Brief Summary: Patient is a 82 y.o.  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  Significant events: 11/11>> admit to [facility]  Significant studies: 11/11>> large left pneumothorax  Significant microbiology data: None  Procedures: 11/11>> chest tube placement by ED physician  Consults: PCCM  Subjective: Lying comfortably in bed-denies any chest pain or shortness of breath.  Son at bedside-translating.  Objective: Vitals: Blood pressure 115/75, pulse 61, temperature 97.6 F (36.4 C), temperature source Oral, resp. rate 15, height 5\' 7"  (1.702 m), weight 71.7 kg, SpO2 98%.   Exam: Gen Exam:Alert awake-not in any distress HEENT:atraumatic, normocephalic Chest: B/L clear to auscultation anteriorly CVS:S1S2 regular Abdomen:soft non tender, non distended Extremities:no edema Neurology: Non focal Skin: no rash  Pertinent Labs/Radiology:    Latest Ref Rng & Units 02/07/2023    3:12 AM 02/06/2023    1:14 PM 11/08/2021    4:57 AM  CBC  WBC 4.0 - 10.5 K/uL 6.4  5.2  5.9   Hemoglobin 13.0 - 17.0 g/dL 78.2  95.6  21.3   Hematocrit 39.0 - 52.0 % 39.8  39.5  38.5   Platelets 150 - 400 K/uL 126  137  127     Lab Results  Component Value Date   NA 138 02/07/2023   K 3.8 02/07/2023   CL 106 02/07/2023   CO2 24 02/07/2023      Assessment/Plan: Left pneumothorax S/p chest tube placement by ED physician PCCM consulted-await further recommendations  Chest pain Likely secondary to  above Pain has resolved following chest tube placement Tropes/EKG stable Doubt any further workup required  Nonobstructive CAD Chest pain-free this morning Continue bisoprolol/statin Suspect not on antiplatelets due to being Xarelto  PAF NSR Telemetry monitoring Flecainide/bisoprolol/Xarelto  HLD Statin  History of 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   Weight as of this encounter: 71.7 kg.   Code status:   Code Status: Full Code   DVT Prophylaxis: SCDs Start: 02/07/23 0302 Rivaroxaban (XARELTO) tablet 15 mg    Family Communication: Son at bedside   Disposition Plan: Status is: Inpatient Remains inpatient appropriate because: Severity of illness   Planned Discharge Destination:Home   Diet: Diet Order             Diet Heart Room service appropriate? Yes; Fluid consistency: Thin  Diet effective now                     Antimicrobial agents: Anti-infectives (From admission, onward)    None        MEDICATIONS: Scheduled Meds:  bisoprolol  2.5 mg Oral QHS   flecainide  50 mg Oral BID   Rivaroxaban  15 mg Oral Q supper   rosuvastatin  20 mg Oral Daily   sodium chloride flush  10 mL Intrapleural Q8H   sodium chloride flush  3 mL Intravenous Q12H   Continuous Infusions:  sodium chloride     PRN Meds:.sodium chloride, acetaminophen **OR** acetaminophen, morphine injection, ondansetron **OR** ondansetron (ZOFRAN) IV, senna-docusate, sodium chloride flush   I have personally reviewed following labs and imaging studies  LABORATORY DATA: CBC: Recent Labs  Lab 02/06/23 1314 02/07/23 0312  WBC 5.2 6.4  HGB 13.7 13.6  HCT 39.5 39.8  MCV 95.6 96.8   PLT 137* 126*    Basic Metabolic Panel: Recent Labs  Lab 02/06/23 1314 02/07/23 0312  NA 138 138  K 4.0 3.8  CL 104 106  CO2 27 24  GLUCOSE 135* 101*  BUN 19 18  CREATININE 1.18 1.14  CALCIUM 9.4 8.8*    GFR: Estimated Creatinine Clearance: 45.9 mL/min (by C-G formula based on SCr of 1.14 mg/dL).  Liver Function Tests: Recent Labs  Lab 02/07/23 0312  AST 18  ALT 18  ALKPHOS 37*  BILITOT 0.7  PROT 6.9  ALBUMIN 3.3*   No results for input(s): "LIPASE", "AMYLASE" in the last 168 hours. No results for input(s): "AMMONIA" in the last 168 hours.  Coagulation Profile: No results for input(s): "INR", "PROTIME" in the last 168 hours.  Cardiac Enzymes: No results for input(s): "CKTOTAL", "CKMB", "CKMBINDEX", "TROPONINI" in the last 168 hours.  BNP (last 3 results) No results for input(s): "PROBNP" in the last 8760 hours.  Lipid Profile: No results for input(s): "CHOL", "HDL", "LDLCALC", "TRIG", "CHOLHDL", "LDLDIRECT" in the last 72 hours.  Thyroid Function Tests: No results for input(s): "TSH", "T4TOTAL", "FREET4", "T3FREE", "THYROIDAB" in the last 72 hours.  Anemia Panel: No results for input(s): "VITAMINB12", "FOLATE", "FERRITIN", "TIBC", "IRON", "RETICCTPCT" in the last 72 hours.  Urine analysis:    Component Value Date/Time   COLORURINE YELLOW 09/10/2009 2103   APPEARANCEUR CLEAR 09/10/2009 2103   LABSPEC 1.007 09/10/2009 2103   PHURINE 5.5 09/10/2009 2103   GLUCOSEU NEGATIVE 09/10/2009 2103   HGBUR NEGATIVE 09/10/2009 2103   BILIRUBINUR NEGATIVE 09/10/2009 2103   KETONESUR NEGATIVE 09/10/2009 2103   PROTEINUR NEGATIVE 09/10/2009 2103   UROBILINOGEN 0.2 09/10/2009 2103   NITRITE NEGATIVE 09/10/2009 2103   LEUKOCYTESUR  09/10/2009 2103    NEGATIVE MICROSCOPIC NOT DONE ON URINES WITH NEGATIVE PROTEIN, BLOOD, LEUKOCYTES, NITRITE, OR GLUCOSE <1000 mg/dL.    Sepsis Labs: Lactic Acid, Venous No results found for: "LATICACIDVEN"  MICROBIOLOGY: No  results found for this or any previous visit (from the past 240 hour(s)).  RADIOLOGY STUDIES/RESULTS: DG Chest Portable 1 View  Result Date: 02/06/2023 CLINICAL DATA:  Chest pains and heaviness for 3 days. EXAM: PORTABLE CHEST 1 VIEW COMPARISON:  Portable chest today at 3:26 p.m. FINDINGS: 6:31 p.m. Small left apical pneumothorax continues to be seen, estimated 5% or less of the chest volume with pigtail chest tube interval pullback such that only the pigtail portion is in the chest cavity. Again noted is a 3 cm left upper lobe perihilar masslike opacity measuring 3 cm, irregular partially calcified mass right upper lobe apex. Stable atelectasis in the lung bases. No new lung abnormality is seen. The cardiomediastinal silhouette is stable. The aorta is tortuous and calcified. Osteopenia. IMPRESSION: 1. Small left apical pneumothorax continues to be seen, estimated 5% or less of the chest volume with pigtail left chest tube interval pullback  such that only the pigtail portion is in the chest cavity. 2. Stable 3 cm left upper lobe perihilar masslike opacity, irregular partially calcified mass right upper lobe apex. 3. Stable atelectasis in the lung bases. 4. Aortic atherosclerosis. Electronically Signed   By: Almira Bar M.D.   On: 02/06/2023 21:49   DG Chest Port 1 View  Result Date: 02/06/2023 CLINICAL DATA:  Chest tube placement EXAM: PORTABLE CHEST 1 VIEW COMPARISON:  Chest x-ray 02/06/2023.  Chest x-ray 07/14/2019. FINDINGS: New left-sided chest tube is present. There has been re-expansion of the left lung. Small left apical pneumothorax persists measuring 13 mm from the lung apex. There is a focal slightly rounded density in the left upper lobe measuring 3 cm, indeterminate. This is increased in size. Atelectatic changes are seen in the left lung base. Stable likely calcified nodule in the right lung base. There is no pleural effusion. The cardiomediastinal silhouette appears within normal limits.  No acute fractures are seen. IMPRESSION: 1. New left-sided chest tube with re-expansion of the left lung. Small left apical pneumothorax persists. 2. Rounded density in the left upper lobe measuring 3 cm has increased in size. Findings are suspicious for malignancy. Recommend further evaluation with CT. Electronically Signed   By: Darliss Cheney M.D.   On: 02/06/2023 18:23   DG Chest 2 View  Result Date: 02/06/2023 CLINICAL DATA:  Chest pain EXAM: CHEST - 2 VIEW COMPARISON:  08/30/2020 x-ray FINDINGS: Large left pneumothorax identified with volume loss and presumed atelectasis. No left-sided effusion. Normal cardiopericardial silhouette calcified aorta. Hyperinflation of the right lung with some tiny dense nodules, unchanged from previous, possibly sequela of old granulomatous disease. No right-sided effusion, pneumothorax or edema. Critical Value/emergent results were called by telephone at the time of interpretation on 02/06/2023 at 10:51 am to provider [facility] , who verbally acknowledged these results. IMPRESSION: Large left pneumothorax. Electronically Signed   By: Karen Kays M.D.   On: 02/06/2023 13:57     LOS: 0 days   Jeoffrey Massed, MD  [facility]    To contact the attending provider between 7A-7P or the covering provider during after hours 7P-7A, please log into the web site [email or web address] and access using universal [place] password for that web site. If you do not have the password, please call the hospital operator.  02/07/2023, 9:40 AM

## 2023-02-07 NOTE — Consult Note (Signed)
Gerald [name], MRN:  [id number], DOB:  1940-04-17, LOS: 0 ADMISSION DATE:  02/06/2023, CONSULTATION DATE:  11/12 REFERRING MD:  Dr. Jerral [name], CHIEF COMPLAINT:  Left sided pneumo   History of Present Illness:  Patient is a 82 yo M w/ pertinent PMH paroxsymal afib on xarelto, CAD, HTN, HLD, acute myeloid leukemia, lung cancer s/p chemoradiation presents to [facility] ED on 11/12 w/ left sided pneumothorax.  Patient is [place] speaking. On 11/9 patient having left sided chest pain worse w/ deep breathing. States he has may have coughed and then had pain. Denies any fall or trauma. Denies sob, fever, chills. Pain persisted and came to [facility] ED on 11/12 for further workup. On arrival patient hemodynamically stable. Placed on 2L [place] w/ sats 98%. CXR showing large left sided pneumothorax. Chest tube placed. CXR showing chest tube in place with improved resolution of pneumothorax; 5% apical pneumothorax persist; Stable 3 cm LUL perihilar masslike opacity; irregular partially calcified mass in RUL in apex. Patient transferred to [facility]. PCCM consulted.  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 rate 15, height  5\' 7"  (1.702 m), weight 71.7 kg, SpO2 98%.        Intake/Output Summary (Last 24 hours) at 02/07/2023 0925 Last data filed at 02/07/2023 0200 Gross per 24 hour  Intake --  Output 200 ml  Net -200 ml   Filed Weights   02/06/23 1306  Weight: 71.7 kg    Examination: General: NAD HEENT: MM pink/moist; [place] in place Neuro: Alert; MAE CV: s1s2, RRR, no m/r/g PULM:  dim clear BS bilaterally; [place] 2L; left sided chest tube in place on -20 cmh2o GI: soft, bsx4 active  Extremities: warm/dry, no edema  Skin: no rashes or lesions    Resolved Hospital Problem list     Assessment & Plan:  Left sided pneumothorax Plan: -cont [place] for sats >92% -chest tube in place -cont CT to -20 cm H2O -CXR in am  Hx of small cell lung cancer of LUL -CXR on 11/12 showing stable 3 cm LUL perihilar masslike opacity; irregular partially calcified mass in RUL in apex Plan: -cont to f/u w/ outpt oncology at [facility] (right click and "Reselect all SmartList Selections" daily)   Per primary  Labs   CBC: Recent Labs  Lab 02/06/23 1314 02/07/23 0312  WBC 5.2 6.4  HGB 13.7 13.6  HCT 39.5 39.8  MCV 95.6 96.8  PLT 137* 126*    Basic Metabolic Panel: Recent Labs  Lab 02/06/23 1314 02/07/23 0312  NA 138 138  K 4.0 3.8  CL 104 106  CO2 27 24  GLUCOSE 135* 101*  BUN 19 18  CREATININE 1.18 1.14  CALCIUM 9.4 8.8*   GFR: Estimated Creatinine Clearance: 45.9 mL/min (by C-G formula based on SCr of 1.14 mg/dL). Recent Labs  Lab 02/06/23 1314 02/07/23 0312  WBC 5.2 6.4    Liver Function Tests: Recent Labs  Lab 02/07/23 0312  AST 18  ALT 18  ALKPHOS 37*  BILITOT 0.7  PROT 6.9  ALBUMIN 3.3*   No results for input(s): "LIPASE", "AMYLASE" in the last 168 hours. No results for input(s): "AMMONIA" in the last 168 hours.  ABG    Component Value Date/Time   HCO3 27.0 (H) 01/11/2007 1348   TCO2 22 12/23/2007 1430     Coagulation Profile: No results for  input(s): "INR", "PROTIME" in the last 168 hours.  Cardiac Enzymes: No results for input(s): "CKTOTAL", "CKMB", "CKMBINDEX", "TROPONINI" in the last 168 hours.  HbA1C: Hgb A1c MFr Bld  Date/Time Value Ref Range Status  01/23/2011 08:07 AM 5.8 (H) <5.7 % Final    Comment:    (NOTE)                                                                       According to the ADA Clinical Practice Recommendations for 2011, when HbA1c is used as a screening test:  >=6.5%   Diagnostic of Diabetes Mellitus           (if abnormal result is confirmed) 5.7-6.4%   Increased risk of developing Diabetes Mellitus References:Diagnosis and Classification of Diabetes Mellitus,Diabetes Care,2011,34(Suppl 1):S62-S69 and Standards of Medical Care in         Diabetes - 2011,Diabetes Care,2011,34 (Suppl 1):S11-S61.    CBG: No results for input(s): "GLUCAP" in the last 168 hours.  Review of Systems:   Review of Systems  Constitutional:  Negative for fever.  Respiratory:  Positive for cough. Negative for shortness of breath and wheezing.   Cardiovascular:  Positive for chest pain.  Gastrointestinal:  Negative for abdominal pain, nausea and vomiting.     Past Medical History:  He,  has a past medical history of A-fib (HCC), Allergy, AML (acute myeloid leukemia) (HCC) (04/24/2013), CAD (coronary artery disease), GERD (gastroesophageal reflux disease), Headache (12/17/2012), Hyperlipidemia, Leukemia (HCC), Other pancytopenia (HCC) (04/09/2013), Small cell lung cancer (HCC) (12/2010), Smoking, and Unspecified deficiency anemia (04/16/2013).   Surgical History:   Past Surgical History:  Procedure Laterality Date   CARDIAC CATHETERIZATION  01/12/2007   noncritical CAD, no change from cath of 05/20/2005. 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   CARDIOVERSION N/A 07/22/2019   Procedure: CARDIOVERSION;  Surgeon: Jake Bathe, MD;  Location[facility] ENDOSCOPY;  Service:  Cardiovascular;  Laterality: N/A;   NM MYOCAR PERF WALL MOTION  01/23/2007   mild perfusion defect seen in the basal inferior and mid inferior regions - c/w attenuation defect. No ischemia present.     Social History:   reports that he quit smoking about 16 years ago. His smoking use included cigarettes. He started smoking about 66 years ago. He has a 50 pack-year smoking history. He has never used smokeless tobacco. He reports that he does not drink alcohol and does not use drugs.  Family History:  His family history includes Healthy in his mother and son; Thyroid cancer in his sister.   Allergies No Known Allergies   Home Medications  Prior to Admission medications   Medication Sig Start Date End Date Taking? Authorizing Provider  bisoprolol (ZEBETA) 5 MG tablet Take 0.5 tablets (2.5 mg total) by mouth at bedtime. 02/24/22   Graciella Freer, [name]  flecainide (TAMBOCOR) 50 MG tablet TAKE 1 TABLET BY MOUTH TWICE A DAY 10/21/22   Lennette Bihari, MD  nitroGLYCERIN (NITROSTAT) 0.4 MG SL tablet Place 1 tablet (0.4 mg total) under the tongue every 5 (five) minutes as needed for chest pain. 10/21/22   Lennette Bihari, MD  rosuvastatin (CRESTOR) 20 MG tablet Take 1 tablet (20 mg total) by mouth daily. 05/06/20 05/28/21  [name], Ovidio Kin., [name]  VITAMIN D PO Take 2,000 Units by mouth every other day.     [provider]  XARELTO 15 MG TABS tablet TAKE 1 TABLET (15 MG TOTAL) BY MOUTH DAILY WITH SUPPER 01/23/23   Lennette Bihari, MD  XARELTO 20 MG TABS tablet TAKE 1 TABLET BY MOUTH DAILY WITH SUPPER 11/25/22   Lennette Bihari, MD         JD Anselm Lis [facility] 02/07/2023, 9:26 AM  Please see [email or web address] for pager details.  From 7A-7P if no response, please call [phone]. After hours, please call ELink [phone].

## 2023-02-07 NOTE — ED Notes (Signed)
Report given to [facility] RN @ [facility]

## 2023-02-07 NOTE — H&P (Signed)
History and Physical    Gerald [name] CWC:[id number] DOB: Feb 06, 1941 DOA: 02/06/2023  PCP: [name], MD   Patient coming from: Home   Chief Complaint:  Chief Complaint  Patient presents with   Chest Pain   ED TRIAGE note:  HPI:  Gerald [name] is a 82 y.o. male with medical history significant of paroxysmal atrial fibrillation on Xarelto, nonobstructive CAD, GERD, essential hypertension, hyperlipidemia, acute myeloid leukemia and lung cancer status post chemoradiation presented to [facility] emergency department complaining of left-sided chest pain.  Workup in ED showed left-sided pneumothorax and chest tube has been placed and patient has been transferred to [facility].  Patient's son at the bedside helped with the history taking.  Patient not [place] speaking and speaks [place] language.  Per patient's son patient noticed left-sided chest pain that started earlier today, chest pain around 9 out of 10 intensity, there is no radiation, worse with chest pain deep breathing and inspiration and there is no relieving factor.  Patient denies any cough and shortness of breath. No other complaints at this time. Currently patient denying any chest pain and shortness of breath.  Patient denies any fever, chill, cough, wheezing, abdominal pain, change of weight and appetite, nausea, vomiting, constipation or diarrhea.  Reported he follows regularly with oncologist.  Actually he has appointment today with the oncologist at [facility] however has to reschedule the appointment as he is hospitalized.  At presentation to ED heart rate 75, respiratory 18, blood pressure 130/87 O2 sat 100%.  Upper chest tube placement patient maintaining O2 sat 98% on 2 L.  Hemodynamically stable. CBC unremarkable.  BMP unremarkable.  Troponin x 2 within normal range.  Initial chest x-ray showed: Large left-sided pneumothorax identified volume loss and presumed atelectasis.  No left-sided pleural effusion.   Normal cardiopericardial show alert.  Hyperinflation of the right lung with some tiny distance nodule unchanged from previous with the sequence of old granulomatous disease.  No right-sided pleural effusion pneumothorax or edema.  Chest x-ray after chest tube placement showing: Small left apical pneumothorax estimated 5% or less of the chest volume with pigtail left chest tube.  Stable 3 cm left upper lobe perihilar masslike opacity and irregular partially calcified mass in the right upper lobe apex.  Stable atelectasis in the lung bases.  In the ED left-sided chest tube has been placed and patient tolerated procedure well.  Patient has been transferred to [facility] for further evaluation management of left-sided pneumothorax.  Informed on-call pulmonology Dr.Albustami.   Review of Systems:  Review of Systems  Constitutional:  Negative for chills, fever, malaise/fatigue and weight loss.  Respiratory:  Negative for cough, sputum production, shortness of breath and wheezing.   Cardiovascular:  Positive for chest pain. Negative for palpitations, orthopnea and leg swelling.  Gastrointestinal:  Negative for abdominal pain, heartburn, nausea and vomiting.  Musculoskeletal:  Negative for back pain and neck pain.  Neurological:  Negative for dizziness and headaches.  Psychiatric/Behavioral:  The patient is not nervous/anxious.     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   CARDIOVERSION N/A 07/22/2019   Procedure: CARDIOVERSION;  Surgeon: Jake Bathe, MD;  Location: [facility] ENDOSCOPY;  Service: Cardiovascular;  Laterality: N/A;   NM MYOCAR PERF WALL MOTION  01/23/2007   mild perfusion defect seen in the basal inferior and mid inferior regions - c/w attenuation defect. No ischemia present.     reports that he quit smoking about 16 years ago. His smoking use included cigarettes. He started smoking about 66 years ago. He has a 50 pack-year smoking history. He has never used smokeless tobacco. He reports that he does not drink alcohol and does not use drugs.  No Known Allergies  Family History  Problem Relation Age of Onset   Healthy Mother    Thyroid cancer Sister    Healthy Son     Prior to Admission medications   Medication Sig Start Date End Date Taking? Authorizing Provider  bisoprolol (ZEBETA) 5 MG tablet Take 0.5 tablets (2.5 mg total) by mouth at bedtime. 02/24/22   Graciella Freer, [name]  flecainide (TAMBOCOR) 50 MG tablet TAKE 1 TABLET BY MOUTH TWICE A DAY 10/21/22   Lennette Bihari, MD  nitroGLYCERIN (NITROSTAT) 0.4 MG SL tablet Place 1 tablet (0.4 mg total) under the tongue every 5 (five) minutes as needed for chest pain. 10/21/22   Lennette Bihari, MD  rosuvastatin (CRESTOR) 20 MG tablet Take 1 tablet (20 mg total) by mouth daily. 05/06/20 05/28/21  [name], Ovidio Kin., [name]  VITAMIN D PO Take 2,000 Units by mouth every other day.     [provider]  XARELTO 15 MG TABS tablet TAKE 1 TABLET (15 MG TOTAL) BY MOUTH DAILY WITH SUPPER 01/23/23   Lennette Bihari, MD  XARELTO 20 MG TABS tablet TAKE 1 TABLET BY MOUTH DAILY WITH SUPPER 11/25/22   Lennette Bihari, MD     Physical Exam: Vitals:   02/06/23 2330 02/07/23 0000 02/07/23 0030 02/07/23 0300  BP: 129/71 119/67 115/75   Pulse: 63 62 61   Resp: 17 15 15    Temp:       TempSrc:      SpO2: 98% 90% 98%   Weight:      Height:    5\' 7"  (1.702 m)    Physical Exam Constitutional:      General: He is not in acute distress.    Appearance: He is not ill-appearing.  Cardiovascular:     Rate and Rhythm: Normal rate and regular rhythm.     Heart sounds: Normal heart sounds.  Pulmonary:     Effort: Pulmonary effort is normal. No tachypnea, accessory muscle usage or respiratory distress.     Breath sounds: Normal breath sounds. No wheezing, rhonchi or rales.  Chest:     Chest wall: No deformity, tenderness or crepitus.  Abdominal:     Palpations: Abdomen is soft.  Skin:    General: Skin is dry.     Capillary Refill: Capillary refill takes less than 2 seconds.  Neurological:     Mental Status: He is alert and oriented to person, place, and time.  Psychiatric:        Mood and Affect: Mood normal.      Labs on Admission: I have  personally reviewed following labs and imaging studies  CBC: Recent Labs  Lab 02/06/23 1314 02/07/23 0312  WBC 5.2 6.4  HGB 13.7 13.6  HCT 39.5 39.8  MCV 95.6 96.8  PLT 137* 126*   Basic Metabolic Panel: Recent Labs  Lab 02/06/23 1314  NA 138  K 4.0  CL 104  CO2 27  GLUCOSE 135*  BUN 19  CREATININE 1.18  CALCIUM 9.4   GFR: Estimated Creatinine Clearance: 44.3 mL/min (by C-G formula based on SCr of 1.18 mg/dL). Liver Function Tests: No results for input(s): "AST", "ALT", "ALKPHOS", "BILITOT", "PROT", "ALBUMIN" in the last 168 hours. No results for input(s): "LIPASE", "AMYLASE" in the last 168 hours. No results for input(s): "AMMONIA" in the last 168 hours. Coagulation Profile: No results for input(s): "INR", "PROTIME" in the last 168 hours. Cardiac Enzymes: Recent Labs  Lab 02/06/23 1314 02/06/23 1600  TROPONINIHS 4 5   BNP (last 3 results) No results for input(s): "BNP" in the last 8760 hours. HbA1C: No results for input(s): "HGBA1C" in the last 72 hours. CBG: No results for input(s): "GLUCAP" in  the last 168 hours. Lipid Profile: No results for input(s): "CHOL", "HDL", "LDLCALC", "TRIG", "CHOLHDL", "LDLDIRECT" in the last 72 hours. Thyroid Function Tests: No results for input(s): "TSH", "T4TOTAL", "FREET4", "T3FREE", "THYROIDAB" in the last 72 hours. Anemia Panel: No results for input(s): "VITAMINB12", "FOLATE", "FERRITIN", "TIBC", "IRON", "RETICCTPCT" in the last 72 hours. Urine analysis:    Component Value Date/Time   COLORURINE YELLOW 09/10/2009 2103   APPEARANCEUR CLEAR 09/10/2009 2103   LABSPEC 1.007 09/10/2009 2103   PHURINE 5.5 09/10/2009 2103   GLUCOSEU NEGATIVE 09/10/2009 2103   HGBUR NEGATIVE 09/10/2009 2103   BILIRUBINUR NEGATIVE 09/10/2009 2103   KETONESUR NEGATIVE 09/10/2009 2103   PROTEINUR NEGATIVE 09/10/2009 2103   UROBILINOGEN 0.2 09/10/2009 2103   NITRITE NEGATIVE 09/10/2009 2103   LEUKOCYTESUR  09/10/2009 2103    NEGATIVE MICROSCOPIC NOT DONE ON URINES WITH NEGATIVE PROTEIN, BLOOD, LEUKOCYTES, NITRITE, OR GLUCOSE <1000 mg/dL.    Radiological Exams on Admission: I have personally reviewed images DG Chest Portable 1 View  Result Date: 02/06/2023 CLINICAL DATA:  Chest pains and heaviness for 3 days. EXAM: PORTABLE CHEST 1 VIEW COMPARISON:  Portable chest today at 3:26 p.m. FINDINGS: 6:31 p.m. Small left apical pneumothorax continues to be seen, estimated 5% or less of the chest volume with pigtail chest tube interval pullback such that only the pigtail portion is in the chest cavity. Again noted is a 3 cm left upper lobe perihilar masslike opacity measuring 3 cm, irregular partially calcified mass right upper lobe apex. Stable atelectasis in the lung bases. No new lung abnormality is seen. The cardiomediastinal silhouette is stable. The aorta is tortuous and calcified. Osteopenia. IMPRESSION: 1. Small left apical pneumothorax continues to be seen, estimated 5% or less of the chest volume with pigtail left chest tube interval pullback such that only the pigtail  portion is in the chest cavity. 2. Stable 3 cm left upper lobe perihilar masslike opacity, irregular partially calcified mass right upper lobe apex. 3. Stable atelectasis in the lung bases. 4. Aortic atherosclerosis. Electronically Signed   By: Almira Bar M.D.   On: 02/06/2023 21:49   DG Chest Port 1 View  Result Date: 02/06/2023 CLINICAL DATA:  Chest tube placement EXAM: PORTABLE CHEST 1 VIEW COMPARISON:  Chest x-ray 02/06/2023.  Chest x-ray 07/14/2019. FINDINGS: New left-sided chest tube is present. There has been re-expansion of the left lung. Small left apical pneumothorax  persists measuring 13 mm from the lung apex. There is a focal slightly rounded density in the left upper lobe measuring 3 cm, indeterminate. This is increased in size. Atelectatic changes are seen in the left lung base. Stable likely calcified nodule in the right lung base. There is no pleural effusion. The cardiomediastinal silhouette appears within normal limits. No acute fractures are seen. IMPRESSION: 1. New left-sided chest tube with re-expansion of the left lung. Small left apical pneumothorax persists. 2. Rounded density in the left upper lobe measuring 3 cm has increased in size. Findings are suspicious for malignancy. Recommend further evaluation with CT. Electronically Signed   By: Darliss Cheney M.D.   On: 02/06/2023 18:23   DG Chest 2 View  Result Date: 02/06/2023 CLINICAL DATA:  Chest pain EXAM: CHEST - 2 VIEW COMPARISON:  08/30/2020 x-ray FINDINGS: Large left pneumothorax identified with volume loss and presumed atelectasis. No left-sided effusion. Normal cardiopericardial silhouette calcified aorta. Hyperinflation of the right lung with some tiny dense nodules, unchanged from previous, possibly sequela of old granulomatous disease. No right-sided effusion, pneumothorax or edema. Critical Value/emergent results were called by telephone at the time of interpretation on 02/06/2023 at 10:51 am to provider [facility] , who verbally acknowledged these results. IMPRESSION: Large left pneumothorax. Electronically Signed   By: Karen Kays M.D.   On: 02/06/2023 13:57    EKG: My personal interpretation of EKG shows: EKG shows sinus rhythm heart rate 66.  There is no ST and T wave abnormality    Assessment/Plan: Principal Problem:   Pneumothorax on left s/p chest tube placement 02/06/23 Active Problems:   Paroxysmal atrial fibrillation (HCC)   History of lung cancer   History of CAD (coronary artery disease)   GERD (gastroesophageal reflux disease)   History of acute myeloid leukemia    Assessment and Plan: Left-sided pneumothorax status post chest tube placement 02/06/2023 > Patient presenting with sudden onset of left-sided chest pain that started earlier today.  In the ED initial presentation patient had O2 sat 100% and hemodynamically stable. - Initial chest x-ray showed large left pneumothorax with volume loss.  Right-sided no pneumothorax, edema pleural effusion. -In the ED chest tube has been placed.  Repeat chest x-ray showed improvement of pneumothorax and there is a small left apical pneumothorax.  It showed a stable 3 cm left upper lobe perihilar masslike opacity and irregular partially calcified mass of the right upper lobe apex.   -Consulted pulmonology for chest tube management. - Continue check pulse ox supplemental oxygen oxygen to keep O2 sat above 94%. - Continue pulmonary toiletry and aspiration precaution.  Noncardiac chest pain -Patient presenting in the ED complaining of left-sided chest pain which is nonradiating.  Troponin x 2 and EKG unremarkable.  Noncardiac chest pain in the setting of pneumothorax.  Continue to monitor. -Morphine as needed  History of nonobstructive CAD -Continue bisoprolol and rosuvastatin.  Proximal atrial fibrillation on Xarelto -Reported last dose of Xarelto yesterday 11/10. - Continue Xarelto 20 mg daily.  Given patient has a chest tube will  continue to monitor closely for any bleeding via chest drainage and will monitor CBC.  The -Continue Xarelto, flecainide and bisoprolol.  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 -Small cell lung cancer diagnosed in 2012.  Managing by radiation oncology and oncology with [facility].  History  of acute myeloid leukemia-in remission -History of acute myeloid leukemia on remission initial diagnosis in 04/16/2013.  Follows oncology with [facility] outpatient.  Hyperlipidemia -Continue rosuvastatin  DVT prophylaxis:  Xarelto and SCD Code Status:  Full Code Diet: Heart healthy diet Family Communication: Patient's son was present at bedside, at the time of interview. Opportunity was given to ask question and all questions were answered satisfactorily.  Disposition Plan: Pending improvement of left-sided pneumothorax. Consults: Pulmonology Admission status:   Inpatient, Telemetry bed  Severity of Illness: The appropriate patient status for this patient is INPATIENT. Inpatient status is judged to be reasonable and necessary in order to provide the required intensity of service to ensure the patient's safety. The patient's presenting symptoms, physical exam findings, and initial radiographic and laboratory data in the context of their chronic comorbidities is felt to place them at high risk for further clinical deterioration. Furthermore, it is not anticipated that the patient will be medically stable for discharge from the hospital within 2 midnights of admission.   * I certify that at the point of admission it is my clinical judgment that the patient will require inpatient hospital care spanning beyond 2 midnights from the point of admission due to high intensity of service, high risk for further deterioration and high frequency of  surveillance required.Marland Kitchen    Tereasa Coop, MD [facility]  How to contact the [facility] Attending or Consulting provider 7A - 7P or covering provider during after hours 7P -7A, for this patient.  Check the care team in [facility] and look for a) attending/consulting [facility] provider listed and b) the [facility] team listed Log into [email or web address] and use [place]'s universal password to access. If you do not have the password, please contact the hospital operator. Locate the [facility] provider you are looking for under [facility] and page to a number that you can be directly reached. If you still have difficulty reaching the provider, please page the [facility] (Director on Call) for the Hospitalists listed on amion for assistance.  02/07/2023, 4:52 AM

## 2023-02-07 NOTE — Plan of Care (Signed)

## 2023-02-07 NOTE — Progress Notes (Signed)
   02/07/23 1523  TOC Brief Assessment  Insurance and Status Reviewed ([employer] Medicare Complete)  Patient has primary care physician Yes (Busy - Default status  [name], MD)  Home environment has been reviewed From Home  Prior level of function: Independent- Speaks [place]- Son helps translate  Prior/Current Home Services No current home services  Social Determinants of Health Reivew SDOH reviewed no interventions necessary  Readmission risk has been reviewed Yes (13%)  Transition of care needs no transition of care needs at this time   TOC to follow for DC needs

## 2023-02-08 ENCOUNTER — Inpatient Hospital Stay (HOSPITAL_COMMUNITY): Payer: 59

## 2023-02-08 DIAGNOSIS — K219 Gastro-esophageal reflux disease without esophagitis: Secondary | ICD-10-CM

## 2023-02-08 DIAGNOSIS — J939 Pneumothorax, unspecified: Secondary | ICD-10-CM | POA: Diagnosis not present

## 2023-02-08 DIAGNOSIS — C9200 Acute myeloblastic leukemia, not having achieved remission: Secondary | ICD-10-CM | POA: Diagnosis not present

## 2023-02-08 DIAGNOSIS — Z85118 Personal history of other malignant neoplasm of bronchus and lung: Secondary | ICD-10-CM | POA: Diagnosis not present

## 2023-02-08 NOTE — Evaluation (Signed)
Occupational Therapy Evaluation Patient Details Name: Gerald [name] MRN: [id number] DOB: 10-18-1940 Today's Date: 02/08/2023   History of Present Illness Gerald [name] is a 82 y.o. male with medical history significant of paroxysmal atrial fibrillation on Xarelto, nonobstructive CAD, GERD, essential hypertension, hyperlipidemia, acute myeloid leukemia and lung cancer status post chemoradiation presented to [facility] emergency department complaining of left-sided chest pain.  Workup in ED showed left-sided pneumothorax and chest tube has been placed and patient has been transferred to [facility].   Clinical Impression   Pt presenting close to functional baseline, ambulating in halls with Supervision + SPC. He does note some lingering chest pain at chest tube site, discussed modifying tasks to limit his increased chest pressure when bending. Pt completing ADLs without assist once he got situated. Pt has no further acute skilled OT needs, no follow-up OT needed.        If plan is discharge home, recommend the following: Assistance with cooking/housework    Functional Status Assessment  Patient has not had a recent decline in their functional status  Equipment Recommendations  None recommended by OT    Recommendations for Other Services       Precautions / Restrictions Precautions Precautions: Fall;Other (comment) Precaution Comments: Chest tube Restrictions Weight Bearing Restrictions: No      Mobility Bed Mobility Overal bed mobility: Modified Independent                  Transfers Overall transfer level: Needs assistance Equipment used: Straight cane Transfers: Sit to/from Stand Sit to Stand: Supervision           General transfer comment: no LOB noted      Balance Overall balance assessment: Mild deficits observed, not formally tested                                         ADL either performed or assessed with clinical judgement    ADL Overall ADL's : Modified independent                                       General ADL Comments: Pt initally displaying need for min A with LBD, then after furtehr assessment he began to perform it own his own. Pt reports donning shorts and brushing teeth earlier without challenge. Discussed limiting bending at the waist due to increased pressure in chest by bringing BLEs and items up to him when completing ADLs     Vision Baseline Vision/History: 1 Wears glasses       Perception         Praxis         Pertinent Vitals/Pain Pain Assessment Pain Assessment: Faces Faces Pain Scale: Hurts a little bit Pain Location: chest tube site Pain Descriptors / Indicators: Aching Pain Intervention(s): Monitored during session     Extremity/Trunk Assessment Upper Extremity Assessment Upper Extremity Assessment: Overall WFL for tasks assessed   Lower Extremity Assessment Lower Extremity Assessment: Defer to PT evaluation   Cervical / Trunk Assessment Cervical / Trunk Assessment: Kyphotic   Communication Communication Communication: Other (comment) (Speaks [place] Daughter provided interpretation) Cueing Techniques: Verbal cues;Gestural cues   Cognition Arousal: Alert Behavior During Therapy: WFL for tasks assessed/performed Overall Cognitive Status: Within Functional Limits for tasks assessed  General Comments  VSS, daughter present    Exercises     Shoulder Instructions      Home Living Family/patient expects to be discharged to:: Private residence Living Arrangements: Spouse/significant other Available Help at Discharge: Family;Available 24 hours/day Type of Home: House Home Access: Stairs to enter [employer] of Steps: 1   Home Layout: Two level;Able to live on main level with bedroom/bathroom Alternate Level Stairs-Number of Steps: 1 flight, no need to go upstairs    Bathroom Shower/Tub: Chief Strategy Officer:  (comfort height)     Home Equipment: Shower seat;Cane - single point;Grab bars - tub/shower   Additional Comments: gardens, works out, does housework. Famiy lives close by- reports      Prior Functioning/Environment Prior Level of Function : Independent/Modified Independent             Mobility Comments: ambulates ind with spc ADLs Comments: ind        OT Problem List: Pain      OT Treatment/Interventions:      OT Goals(Current goals can be found in the care plan section) Acute Rehab OT Goals Patient Stated Goal: To go home OT Goal Formulation: With patient/family Time For Goal Achievement: 02/22/23 Potential to Achieve Goals: Good  OT Frequency:      Co-evaluation              AM-PAC OT "6 Clicks" Daily Activity     Outcome Measure Help from another person eating meals?: None Help from another person taking care of personal grooming?: None Help from another person toileting, which includes using toliet, bedpan, or urinal?: None Help from another person bathing (including washing, rinsing, drying)?: None Help from another person to put on and taking off regular upper body clothing?: None Help from another person to put on and taking off regular lower body clothing?: None 6 Click Score: 24   End of Session Equipment Utilized During Treatment: Gait belt;Other (comment) [facility]) Nurse Communication: Mobility status  Activity Tolerance: Patient tolerated treatment well Patient left: in bed;with call bell/phone within reach;with family/visitor present  OT Visit Diagnosis: Pain Pain - part of body:  (chest)                Time: 6160-7371 OT Time Calculation (min): 17 min Charges:  OT General Charges $OT Visit: 1 Visit OT Evaluation $OT Eval Low Complexity: 1 Low  02/08/2023  AB, OTR/L  Acute Rehabilitation Services  Office: [phone]   Tristan Schroeder 02/08/2023, 4:20 PM

## 2023-02-08 NOTE — Plan of Care (Signed)
Patient denies shortness of breath. May get chest tube removed this pm.

## 2023-02-08 NOTE — Progress Notes (Signed)
   02/08/23 1400  Spiritual Encounters  Type of Visit Initial  Care provided to: Patient;Family  Referral source Nurse (RN/NT/LPN)  Reason for visit Advance directives  OnCall Visit No   Chaplain responded to request for AD. Patient's daughter was present at bedside. Patient and patient's daughter said they did not order an AD. Ch remains available if needed.

## 2023-02-08 NOTE — Evaluation (Signed)
Physical Therapy Brief Evaluation and Discharge Note Patient Details Name: Gerald [name] MRN: [id number] DOB: 1940/08/22 Today's Date: 02/08/2023   History of Present Illness  Gerald [name] is a 82 y.o. male with medical history significant of paroxysmal atrial fibrillation on Xarelto, nonobstructive CAD, GERD, essential hypertension, hyperlipidemia, acute myeloid leukemia and lung cancer status post chemoradiation presented to [facility] emergency department complaining of left-sided chest pain.  Workup in ED showed left-sided pneumothorax and chest tube has been placed and patient has been transferred to [facility]  Clinical Impression  Pt tolerated treatment well today. Pt was able to ambulate in hallway with SPC Mod I. Pt daughter was present and provided interpretation. Pt daughter reports that PTA pt was Mod I with SPC and very active. Pt presents at or near baseline mobility. Pt has no further acute PT needs and will be signing off. Re consult PT if mobility status changes. Pt would benefit from continued mobility with mobility specialist during acute stay.      PT Assessment Patient does not need any further PT services  Assistance Needed at Discharge  PRN    Equipment Recommendations None recommended by PT  Recommendations for Other Services       Precautions/Restrictions Precautions Precautions: Fall;Other (comment) Precaution Comments: Chest tube Restrictions Weight Bearing Restrictions: No        Mobility  Bed Mobility   Supine/Sidelying to sit: Contact guard assist   General bed mobility comments: Daughter assisted pt with bed mobility although he likely didnt need it.  Transfers Overall transfer level: Needs assistance Equipment used: Straight cane Transfers: Sit to/from Stand Sit to Stand: Supervision           General transfer comment: no LOB noted    Ambulation/Gait Ambulation/Gait assistance: Modified independent (Device/Increase  time) Gait Distance (Feet): 300 Feet Assistive device: Straight cane Gait Pattern/deviations: Decreased stride length, Step-through pattern, Wide base of support Gait Speed: Pace WFL General Gait Details: no LOB noted.  Home Activity Instructions    Stairs            Modified Rankin (Stroke Patients Only)        Balance Overall balance assessment: No apparent balance deficits (not formally assessed)                        Pertinent Vitals/Pain PT - Brief Vital Signs All Vital Signs Stable: Yes Pain Assessment Pain Assessment: 0-10 Pain Score: 2  Pain Location: chest tube site Pain Descriptors / Indicators: Aching Pain Intervention(s): Monitored during session     Home Living Family/patient expects to be discharged to:: Private residence Living Arrangements: Spouse/significant other Available Help at Discharge: Family;Available 24 hours/day Home Environment: Stairs to enter  [employer] of Steps: 1 Home Equipment: Shower seat;Cane - single point;Grab bars - tub/shower   Additional Comments: gardens, works out, does housework. Famiy lives close by- reports    Prior Function Level of Independence: Independent with assistive device(s) Comments: SPC    UE/LE Assessment   UE ROM/Strength/Tone/Coordination: WFL    LE ROM/Strength/Tone/Coordination: [facility]      Communication   Communication Communication: Other (comment) (Speaks [place] Daughter provided interpretation)     Cognition Overall Cognitive Status: Appears within functional limits for tasks assessed/performed       General Comments General comments (skin integrity, edema, etc.): VSS    Exercises     Assessment/Plan    PT Problem List  PT Visit Diagnosis Other abnormalities of gait and mobility (R26.89)    No Skilled PT Patient at baseline level of functioning;Patient is modified independent with all activity/mobility   Co-evaluation                 AMPAC 6 Clicks Help needed turning from your back to your side while in a flat bed without using bedrails?: A Little Help needed moving from lying on your back to sitting on the side of a flat bed without using bedrails?: A Little Help needed moving to and from a bed to a chair (including a wheelchair)?: A Little Help needed standing up from a chair using your arms (e.g., wheelchair or bedside chair)?: A Little Help needed to walk in hospital room?: None Help needed climbing 3-5 steps with a railing? : A Little 6 Click Score: 19      End of Session Equipment Utilized During Treatment: Other (comment) (Deferred gait belt due to chest tube) Activity Tolerance: Patient tolerated treatment well Patient left: in bed;with call bell/phone within reach;with family/visitor present Nurse Communication: Mobility status PT Visit Diagnosis: Other abnormalities of gait and mobility (R26.89)     Time: 1610-9604 PT Time Calculation (min) (ACUTE ONLY): 18 min  Charges:   PT Evaluation $PT Eval Low Complexity: 1 Low      Shela Nevin, PT, DPT Acute Rehab Services [phone]   Gladys Damme  02/08/2023, 4:06 PM

## 2023-02-08 NOTE — Progress Notes (Signed)
PROGRESS NOTE        PATIENT DETAILS Name: Gerald [name] Age: 82 y.o. Sex: male Date of Birth: 04/25/1940 Admit Date: 02/06/2023 Admitting Physician Briant Cedar, MD OZH:[id number], Onalee Hua, MD  Brief Summary: Patient is a 82 y.o.  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  Significant events: 11/11>> admit to [facility]  Significant studies: 11/11>> large left pneumothorax  Significant microbiology data: None  Procedures: 11/11>> chest tube placement by ED physician  Consults: PCCM  Subjective: No complaints-lying comfortably in bed.  Objective: Vitals: Blood pressure 120/69, pulse (!) 55, temperature 97.8 F (36.6 C), temperature source Oral, resp. rate 17, height 5\' 7"  (1.702 m), weight 71.7 kg, SpO2 94%.   Exam: Gen Exam:Alert awake-not in any distress HEENT:atraumatic, normocephalic Chest: B/L clear to auscultation anteriorly CVS:S1S2 regular Abdomen:soft non tender, non distended Extremities:no edema Neurology: Non focal Skin: no rash  Pertinent Labs/Radiology:    Latest Ref Rng & Units 02/07/2023    3:12 AM 02/06/2023    1:14 PM 11/08/2021    4:57 AM  CBC  WBC 4.0 - 10.5 K/uL 6.4  5.2  5.9   Hemoglobin 13.0 - 17.0 g/dL 46.9  62.9  52.8   Hematocrit 39.0 - 52.0 % 39.8  39.5  38.5   Platelets 150 - 400 K/uL 126  137  127     Lab Results  Component Value Date   NA 138 02/07/2023   K 3.8 02/07/2023   CL 106 02/07/2023   CO2 24 02/07/2023      Assessment/Plan: Left pneumothorax S/p chest tube placement by ED physician Chest x-ray with only trace pneumothorax this morning Defer further to PCCM.  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   Weight as of this encounter: 71.7 kg.   Code status:   Code Status: Full Code   DVT Prophylaxis: SCDs Start: 02/07/23 0302 Rivaroxaban (XARELTO) tablet 15 mg    Family Communication: Son at bedside   Disposition Plan: Status is: Inpatient Remains inpatient appropriate because: Severity of illness   Planned Discharge Destination:Home   Diet: Diet Order             Diet Heart Room service appropriate? Yes; Fluid consistency: Thin  Diet effective now                     Antimicrobial agents: Anti-infectives (From admission, onward)    None        MEDICATIONS: Scheduled Meds:  bisoprolol  2.5 mg Oral QHS   flecainide  50  mg Oral BID   Rivaroxaban  15 mg Oral Q supper   rosuvastatin  20 mg Oral Daily   sodium chloride flush  10 mL Intrapleural Q8H   sodium chloride flush  3 mL Intravenous Q12H   Continuous Infusions:   PRN Meds:.acetaminophen **OR** acetaminophen, morphine injection, ondansetron **OR** ondansetron (ZOFRAN) IV, senna-docusate, sodium chloride flush   I have personally reviewed following labs and imaging studies  LABORATORY DATA: CBC: Recent Labs  Lab 02/06/23 1314 02/07/23 0312  WBC 5.2 6.4  HGB 13.7 13.6  HCT 39.5 39.8  MCV 95.6 96.8  PLT 137* 126*    Basic Metabolic Panel: Recent Labs  Lab  02/06/23 1314 02/07/23 0312  NA 138 138  K 4.0 3.8  CL 104 106  CO2 27 24  GLUCOSE 135* 101*  BUN 19 18  CREATININE 1.18 1.14  CALCIUM 9.4 8.8*    GFR: Estimated Creatinine Clearance: 45.9 mL/min (by C-G formula based on SCr of 1.14 mg/dL).  Liver Function Tests: Recent Labs  Lab 02/07/23 0312  AST 18  ALT 18  ALKPHOS 37*  BILITOT 0.7  PROT 6.9  ALBUMIN 3.3*   No results for input(s): "LIPASE", "AMYLASE" in the last 168 hours. No results for input(s): "AMMONIA" in the last 168 hours.  Coagulation Profile: No results for input(s): "INR", "PROTIME" in the last 168 hours.  Cardiac Enzymes: No results for input(s): "CKTOTAL", "CKMB", "CKMBINDEX", "TROPONINI" in the last 168 hours.  BNP (last 3 results) No results for input(s): "PROBNP" in the last 8760 hours.  Lipid Profile: No results for input(s): "CHOL", "HDL", "LDLCALC", "TRIG", "CHOLHDL", "LDLDIRECT" in the last 72 hours.  Thyroid Function Tests: No results for input(s): "TSH", "T4TOTAL", "FREET4", "T3FREE", "THYROIDAB" in the last 72 hours.  Anemia Panel: No results for input(s): "VITAMINB12", "FOLATE", "FERRITIN", "TIBC", "IRON", "RETICCTPCT" in the last 72 hours.  Urine analysis:    Component Value Date/Time   COLORURINE YELLOW 09/10/2009 2103   APPEARANCEUR CLEAR 09/10/2009 2103   LABSPEC 1.007 09/10/2009 2103   PHURINE 5.5 09/10/2009 2103   GLUCOSEU NEGATIVE 09/10/2009 2103   HGBUR NEGATIVE 09/10/2009 2103   BILIRUBINUR NEGATIVE 09/10/2009 2103   KETONESUR NEGATIVE 09/10/2009 2103   PROTEINUR NEGATIVE 09/10/2009 2103   UROBILINOGEN 0.2 09/10/2009 2103   NITRITE NEGATIVE 09/10/2009 2103   LEUKOCYTESUR  09/10/2009 2103    NEGATIVE MICROSCOPIC NOT DONE ON URINES WITH NEGATIVE PROTEIN, BLOOD, LEUKOCYTES, NITRITE, OR GLUCOSE <1000 mg/dL.    Sepsis Labs: Lactic Acid, Venous No results found for: "LATICACIDVEN"  MICROBIOLOGY: No results found for this or any previous visit (from the past 240  hour(s)).  RADIOLOGY STUDIES/RESULTS: DG CHEST PORT 1 VIEW  Result Date: 02/08/2023 CLINICAL DATA:  [id number].  Left pneumothorax.  Left chest tube. EXAM: PORTABLE CHEST 1 VIEW COMPARISON:  Portable chest 02/06/2023 at 6:31 p.m. FINDINGS: 4:27 a.m.  There is a trace left apical pneumothorax, improved. Pigtail left chest tube with only the pigtail remaining intrathoracic is again noted in the lower lateral thorax. There are bilateral upper lobe masses again seen, on the right partially calcified, with no new abnormality in the lung fields. Minimal pleural effusions. There is mild cardiomegaly, stable mediastinum with aortic calcification. No vascular congestion. Osteopenia. IMPRESSION: 1. Trace left apical pneumothorax, improved. 2. Pigtail left chest tube with only the pigtail remaining intrathoracic. 3. Bilateral upper lobe masses again seen. 4. Minimal pleural effusions. 5. Aortic atherosclerosis. Electronically Signed   By: Almira Bar M.D.   On: 02/08/2023 07:11  DG Chest Portable 1 View  Result Date: 02/06/2023 CLINICAL DATA:  Chest pains and heaviness for 3 days. EXAM: PORTABLE CHEST 1 VIEW COMPARISON:  Portable chest today at 3:26 p.m. FINDINGS: 6:31 p.m. Small left apical pneumothorax continues to be seen, estimated 5% or less of the chest volume with pigtail chest tube interval pullback such that only the pigtail portion is in the chest cavity. Again noted is a 3 cm left upper lobe perihilar masslike opacity measuring 3 cm, irregular partially calcified mass right upper lobe apex. Stable atelectasis in the lung bases. No new lung abnormality is seen. The cardiomediastinal silhouette is stable. The aorta is tortuous and calcified. Osteopenia. IMPRESSION: 1. Small left apical pneumothorax continues to be seen, estimated 5% or less of the chest volume with pigtail left chest tube interval pullback such that only the pigtail portion is in the chest cavity. 2. Stable 3 cm left upper lobe perihilar  masslike opacity, irregular partially calcified mass right upper lobe apex. 3. Stable atelectasis in the lung bases. 4. Aortic atherosclerosis. Electronically Signed   By: Almira Bar M.D.   On: 02/06/2023 21:49   DG Chest Port 1 View  Result Date: 02/06/2023 CLINICAL DATA:  Chest tube placement EXAM: PORTABLE CHEST 1 VIEW COMPARISON:  Chest x-ray 02/06/2023.  Chest x-ray 07/14/2019. FINDINGS: New left-sided chest tube is present. There has been re-expansion of the left lung. Small left apical pneumothorax persists measuring 13 mm from the lung apex. There is a focal slightly rounded density in the left upper lobe measuring 3 cm, indeterminate. This is increased in size. Atelectatic changes are seen in the left lung base. Stable likely calcified nodule in the right lung base. There is no pleural effusion. The cardiomediastinal silhouette appears within normal limits. No acute fractures are seen. IMPRESSION: 1. New left-sided chest tube with re-expansion of the left lung. Small left apical pneumothorax persists. 2. Rounded density in the left upper lobe measuring 3 cm has increased in size. Findings are suspicious for malignancy. Recommend further evaluation with CT. Electronically Signed   By: Darliss Cheney M.D.   On: 02/06/2023 18:23   DG Chest 2 View  Result Date: 02/06/2023 CLINICAL DATA:  Chest pain EXAM: CHEST - 2 VIEW COMPARISON:  08/30/2020 x-ray FINDINGS: Large left pneumothorax identified with volume loss and presumed atelectasis. No left-sided effusion. Normal cardiopericardial silhouette calcified aorta. Hyperinflation of the right lung with some tiny dense nodules, unchanged from previous, possibly sequela of old granulomatous disease. No right-sided effusion, pneumothorax or edema. Critical Value/emergent results were called by telephone at the time of interpretation on 02/06/2023 at 10:51 am to provider [facility] , who verbally acknowledged these results. IMPRESSION: Large left  pneumothorax. Electronically Signed   By: Karen Kays M.D.   On: 02/06/2023 13:57     LOS: 1 day   Jeoffrey Massed, MD  [facility]    To contact the attending provider between 7A-7P or the covering provider during after hours 7P-7A, please log into the web site [email or web address] and access using universal [facility] password for that web site. If you do not have the password, please call the hospital operator.  02/08/2023, 8:30 AM

## 2023-02-08 NOTE — Progress Notes (Addendum)
Gerald [name], MRN:  [id number], DOB:  [birth year], LOS: 1 ADMISSION DATE:  02/06/2023, CONSULTATION DATE:  11/12 REFERRING MD:  Dr. 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  Patient is [place] speaking. On 11/9 patient having left sided chest pain worse w/ deep breathing. States he has may have coughed and then had pain. Denies any fall or trauma. Denies sob, fever, chills. Pain persisted and came to [facility] ED on 11/12 for further workup. On arrival patient hemodynamically stable. Placed on 2L [place] w/ sats 98%. CXR showing large left sided pneumothorax. Chest tube placed. CXR showing chest tube in place with improved resolution of pneumothorax; 5% apical pneumothorax persist; Stable 3 cm LUL perihilar masslike opacity; irregular partially calcified mass in RUL in apex. Patient transferred to [facility]. PCCM consulted.  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 rate 16, height 5\' 7"  (1.702 m), weight 71.7 kg, SpO2 94%.        Intake/Output Summary (Last 24 hours) at 02/08/2023 1028 Last data filed at 02/08/2023 1000 Gross per 24 hour  Intake 200 ml  Output --  Net 200 ml   Filed Weights   02/06/23 1306  Weight: 71.7 kg    Examination: Awake alert no acute distress No JVD or lymphadenopathy is appreciated Heart sounds are regular Diminished breath sounds left side left chest tube without airleak placed to waterseal Voids Lower extremities without edema  Resolved Hospital Problem list     Assessment & Plan:  Left sided pneumothorax Plan: Left chest tube remains in place X-ray from 02/08/2023 with improvement Change from 20 cm of suction to waterseal 02/08/2023 at 10 AM Check chest x-ray at 4 PM   Hx of small cell lung cancer of LUL -CXR on 11/12 showing stable 3 cm LUL perihilar masslike opacity; irregular partially calcified mass in RUL in apex Plan: Continue to follow-up with outpatient oncology  Best Practice (right click and "Reselect all SmartList Selections" daily)   Per primary  Labs   CBC: Recent Labs  Lab 02/06/23 1314 02/07/23 0312  WBC 5.2 6.4  HGB 13.7 13.6  HCT 39.5 39.8  MCV 95.6 96.8  PLT 137* 126*    Basic Metabolic Panel: Recent Labs  Lab 02/06/23 1314 02/07/23 0312  NA 138 138  K 4.0 3.8  CL 104 106  CO2 27 24  GLUCOSE  135* 101*  BUN 19 18  CREATININE 1.18 1.14  CALCIUM 9.4 8.8*   GFR: Estimated Creatinine Clearance: 45.9 mL/min (by C-G formula based on SCr of 1.14 mg/dL). Recent Labs  Lab 02/06/23 1314 02/07/23 0312  WBC 5.2 6.4    Liver Function Tests: Recent Labs  Lab 02/07/23 0312  AST 18  ALT 18  ALKPHOS 37*  BILITOT 0.7  PROT 6.9  ALBUMIN 3.3*   No results for input(s): "LIPASE", "AMYLASE" in the last 168 hours. No results for input(s): "AMMONIA" in the last 168 hours.  ABG    Component Value Date/Time   HCO3 27.0 (H) 01/11/2007 1348    TCO2 22 12/23/2007 1430     Coagulation Profile: No results for input(s): "INR", "PROTIME" in the last 168 hours.  Cardiac Enzymes: No results for input(s): "CKTOTAL", "CKMB", "CKMBINDEX", "TROPONINI" in the last 168 hours.  HbA1C: Hgb A1c MFr Bld  Date/Time Value Ref Range Status  01/23/2011 08:07 AM 5.8 (H) <5.7 % Final    Comment:    (NOTE)                                                                       According to the ADA Clinical Practice Recommendations for 2011, when HbA1c is used as a screening test:  >=6.5%   Diagnostic of Diabetes Mellitus           (if abnormal result is confirmed) 5.7-6.4%   Increased risk of developing Diabetes Mellitus References:Diagnosis and Classification of Diabetes Mellitus,Diabetes Care,2011,34(Suppl 1):S62-S69 and Standards of Medical Care in         Diabetes - 2011,Diabetes Care,2011,34 (Suppl 1):S11-S61.    CBG: No results for input(s): "GLUCAP" in the last 168 hours.         Brett Canales Tasheka Houseman ACNP Acute Care Nurse Practitioner [facility] Pulmonary/Critical Care Please consult Amion 02/08/2023, 10:32 AM

## 2023-02-09 ENCOUNTER — Inpatient Hospital Stay (HOSPITAL_COMMUNITY): Payer: 59

## 2023-02-09 DIAGNOSIS — C9200 Acute myeloblastic leukemia, not having achieved remission: Secondary | ICD-10-CM | POA: Diagnosis not present

## 2023-02-09 DIAGNOSIS — I480 Paroxysmal atrial fibrillation: Secondary | ICD-10-CM | POA: Diagnosis not present

## 2023-02-09 DIAGNOSIS — Z85118 Personal history of other malignant neoplasm of bronchus and lung: Secondary | ICD-10-CM | POA: Diagnosis not present

## 2023-02-09 DIAGNOSIS — J939 Pneumothorax, unspecified: Secondary | ICD-10-CM | POA: Diagnosis not present

## 2023-02-09 MED ORDER — BENZONATATE 100 MG PO CAPS
200.0000 mg | ORAL_CAPSULE | Freq: Three times a day (TID) | ORAL | Status: DC
  Administered 2023-02-09 (×2): 200 mg via ORAL
  Filled 2023-02-09 (×3): qty 2

## 2023-02-09 MED ORDER — BENZONATATE 200 MG PO CAPS: 200.0000 mg | ORAL_CAPSULE | Freq: Three times a day (TID) | ORAL | 0 refills | Status: AC | PRN

## 2023-02-09 NOTE — Procedures (Signed)
02/09/2023 11:10 AM  ACNP Annamarie Yamaguchi Procedure: Left chest tube was removed without issue.  Sutures were removed tube was removed a sterile occlusive dressing was placed and chest x-rays have been ordered.  Brett Canales Nussen Pullin ACNP Acute Care Nurse Practitioner [facility] Pulmonary/Critical Care Please consult Amion 02/09/2023, 11:06 AM

## 2023-02-09 NOTE — Progress Notes (Signed)
Discharge teaching completed. Family denies questions. IV removed and bled pressure held. Patient is on blood thinners.No hematoma. Patient discharging home with family.

## 2023-02-09 NOTE — Progress Notes (Signed)
Gerald [name], MRN:  [id number], DOB:  [birth year], LOS: 2 ADMISSION DATE:  02/06/2023, CONSULTATION DATE:  11/12 REFERRING MD:  Dr. 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  Patient is [place] speaking. On 11/9 patient having left sided chest pain worse w/ deep breathing. States he has may have coughed and then had pain. Denies any fall or trauma. Denies sob, fever, chills. Pain persisted and came to [facility] ED on 11/12 for further workup. On arrival patient hemodynamically stable. Placed on 2L [place] w/ sats 98%. CXR showing large left sided pneumothorax. Chest tube placed. CXR showing chest tube in place with improved resolution of pneumothorax; 5% apical pneumothorax persist; Stable 3 cm LUL perihilar masslike opacity; irregular partially calcified mass in RUL in apex. Patient transferred to [facility]. PCCM consulted.  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 Interim History / Subjective:  Chest tube has been removed  Objective   Blood pressure 124/68, pulse (!) 55,  temperature (!) 97.2 F (36.2 C), temperature source Oral, resp. rate 20, height 5\' 7"  (1.702 m), weight 71.7 kg, SpO2 93%.        Intake/Output Summary (Last 24 hours) at 02/09/2023 1107 Last data filed at 02/09/2023 1000 Gross per 24 hour  Intake 840 ml  Output 250 ml  Net 590 ml   Filed Weights   02/06/23 1306  Weight: 71.7 kg    Examination: Awake alert no acute distress Granddaughter is at bedside to interpret Left chest tube removed without problem sterile dressing applied Decreased breath sounds in right base Abdomen soft nontender Heart sounds are distant Remedies are warm and dry  Resolved Hospital Problem list     Assessment & Plan:  Left sided pneumothorax Plan: 02/09/2023 left chest tube were removed without problem Chest x-ray has been ordered for 1400 hrs. If lung remains expanded no or reason he cannot be discharged per primary care   Hx of small cell lung cancer of LUL -CXR on 11/12 showing stable 3 cm LUL perihilar masslike opacity; irregular partially calcified mass in RUL in apex Plan: Outpatient follow-up  Best Practice (right click and "Reselect all SmartList Selections" daily)   Per primary  Labs   CBC: Recent Labs  Lab 02/06/23 1314 02/07/23 0312  WBC 5.2 6.4  HGB 13.7 13.6  HCT 39.5 39.8  MCV 95.6 96.8  PLT 137* 126*    Basic Metabolic Panel: Recent Labs  Lab 02/06/23 1314 02/07/23 0312  NA 138 138  K 4.0 3.8  CL 104 106  CO2 27 24  GLUCOSE 135* 101*  BUN 19 18  CREATININE 1.18 1.14  CALCIUM 9.4 8.8*   GFR: Estimated Creatinine Clearance: 45.9 mL/min (by C-G formula based on SCr of 1.14 mg/dL). Recent Labs  Lab 02/06/23 1314 02/07/23 0312  WBC 5.2 6.4    Liver Function Tests: Recent Labs  Lab 02/07/23 0312  AST 18  ALT 18  ALKPHOS 37*  BILITOT 0.7  PROT 6.9  ALBUMIN 3.3*   No results for input(s): "LIPASE", "AMYLASE" in the last 168 hours. No results for input(s): "AMMONIA" in the last 168  hours.  ABG    Component Value Date/Time   HCO3 27.0 (H) 01/11/2007 1348   TCO2 22 12/23/2007 1430     Coagulation Profile: No results for input(s): "INR", "PROTIME" in the last 168 hours.  Cardiac Enzymes: No results for input(s): "CKTOTAL", "CKMB", "CKMBINDEX", "TROPONINI" in the last 168 hours.  HbA1C: Hgb A1c MFr Bld  Date/Time Value Ref Range Status  01/23/2011 08:07 AM 5.8 (H) <5.7 % Final    Comment:    (NOTE)                                                                       According to the ADA Clinical Practice Recommendations for 2011, when HbA1c is used as a screening test:  >=6.5%   Diagnostic of Diabetes Mellitus           (if abnormal result is confirmed) 5.7-6.4%   Increased risk of developing Diabetes Mellitus References:Diagnosis and Classification of Diabetes Mellitus,Diabetes Care,2011,34(Suppl 1):S62-S69 and Standards of Medical Care in         Diabetes - 2011,Diabetes Care,2011,34 (Suppl 1):S11-S61.    CBG: No results for input(s): "GLUCAP" in the last 168 hours.         Brett Canales Waverly Chavarria ACNP Acute Care Nurse Practitioner [facility]/Critical [facility] Please consult Amion 02/09/2023, 11:07 AM

## 2023-02-09 NOTE — Plan of Care (Signed)
Patient getting discharged home today.

## 2023-02-09 NOTE — Progress Notes (Signed)
William Minor NP discontinued patient's chest tube on the left without complications. Dressing applied by W. Minor NP. Patient denies complaints.

## 2023-02-09 NOTE — Discharge Summary (Signed)
PATIENT DETAILS Name: Gerald [name] Age: 82 y.o. Sex: male Date of Birth: 12/25/1940 MRN: [id number]. Admitting Physician: Briant Cedar, MD NWG:[id number], Onalee Hua, MD  Admit Date: 02/06/2023 Discharge date: 02/09/2023  Recommendations for Outpatient Follow-up:  Follow up with PCP in 1-2 weeks Please obtain CMP/CBC in one week  Admitted From:  Home  Disposition: Home   Discharge Condition: good  CODE STATUS:   Code Status: Full Code   Diet recommendation:  Diet Order             Diet - low sodium heart healthy           Diet Heart Room service appropriate? Yes; Fluid consistency: Thin  Diet effective now                    Brief Summary: Patient is a 82 y.o.  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   Significant events: 11/11>> admit to [facility]   Significant studies: 11/11>> large left pneumothorax   Significant microbiology data: None   Procedures: 11/11>> chest tube placement by ED physician   Consults: PCCM  Brief Hospital Course: Left pneumothorax S/p chest tube placement by ED physician on admission Followed closely by PCCM-chest tube noted 11/14-chest x-ray post chest tube removal was stable.Discussed with Dr Windell Moment to d/c from his point of view   Chest pain Likely secondary to above Pain has resolved following chest tube placement Trop's/EKG stable Doubt any further workup required   Nonobstructive CAD Chest pain-free since admit Suspect not on antiplatelets due to being Xarelto   PAF NSR Flecainide/Xarelto   HLD Statin   History of 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   Weight as of this encounter: 71.7 kg.    Discharge Diagnoses:  Principal Problem:   Pneumothorax on left s/p chest tube placement 02/06/23 Active Problems:   Paroxysmal atrial fibrillation (HCC)   History of lung cancer   History of CAD (coronary artery disease)   GERD (gastroesophageal reflux disease)   History of acute myeloid leukemia   Discharge Instructions:  Activity:  As tolerated    Discharge Instructions     Call MD for:  difficulty breathing, headache or visual disturbances   Complete by: As directed    Call MD for:  difficulty breathing, headache or visual disturbances   Complete by: As directed    Diet - low sodium heart healthy   Complete by: As directed    Discharge instructions   Complete by: As directed    Follow with Primary MD  [name], MD in 1-2 weeks  Please get a complete blood count and chemistry panel checked by your Primary MD at your next visit, and again as instructed by your Primary MD.  Get Medicines reviewed and adjusted: Please take all your medications with you for your next visit with your Primary MD  Laboratory/radiological data: Please request your Primary MD to go over all hospital tests  and procedure/radiological results at the follow up, please ask your Primary MD to get all Hospital records sent to his/her office.  In some cases, they will be blood work, cultures and biopsy results pending at the time of your discharge. Please request that your primary care M.D. follows up on these results.  Also Note the following: If you experience worsening of your admission symptoms, develop shortness of breath, life threatening emergency, suicidal or homicidal thoughts you must seek medical attention immediately by calling 911  or calling your MD immediately  if symptoms less severe.  You must read complete instructions/literature along with all the possible adverse reactions/side effects for all the Medicines you take and that have been prescribed to you. Take any new Medicines after you have completely understood and accpet all the possible adverse reactions/side effects.   Do not drive when taking Pain medications or sleeping medications (Benzodaizepines)  Do not take more than prescribed Pain, Sleep and Anxiety Medications. It is not advisable to combine anxiety,sleep and pain medications without talking with your primary care practitioner  Special Instructions: If you have smoked or chewed Tobacco  in the last 2 yrs please stop smoking, stop any regular Alcohol  and or any Recreational drug use.  Wear Seat belts while driving.  Please note: You were cared for by a hospitalist during your hospital stay. Once you are discharged, your primary care physician will handle any further medical issues. Please note that NO REFILLS for any discharge medications will be authorized once you are discharged, as it is imperative that you return to your primary care physician (or establish a relationship with a primary care physician if you do not have one) for your post hospital discharge needs so that they can reassess your need for medications and monitor your lab values.   Increase activity slowly   Complete by: As directed    Increase activity slowly   Complete by: As directed    No dressing needed   Complete by: As directed    No dressing needed   Complete by: As directed       Allergies as of 02/09/2023   No Known Allergies      Medication List     TAKE these medications    benzonatate 200 MG capsule Commonly known as: TESSALON Take 1 capsule (200 mg total) by mouth 3 (three) times daily as needed for cough.   flecainide 50 MG tablet Commonly known as: TAMBOCOR TAKE 1 TABLET BY MOUTH TWICE A DAY    nitroGLYCERIN 0.4 MG SL tablet Commonly known as: NITROSTAT Place 1 tablet (0.4 mg total) under the tongue every 5 (five) minutes as needed for chest pain.   rosuvastatin 20 MG tablet Commonly known as: CRESTOR Take 1 tablet (20 mg total) by mouth daily.   VITAMIN D PO Take 2,000 Units by mouth every other day.   Xarelto 15 MG Tabs tablet Generic drug: Rivaroxaban TAKE 1 TABLET (15 MG TOTAL) BY MOUTH DAILY WITH SUPPER               Discharge Care Instructions  (From admission, onward)           Start     Ordered   02/09/23 0000  No dressing needed        02/09/23 1118   02/09/23 0000  No dressing needed        02/09/23 1508            Follow-up Information  [name], MD. Schedule an appointment as soon as possible for a visit in 1 week(s).   Specialty: Family Medicine Contact information: [address], [address] [place] [address] [phone]                No Known Allergies   Other Procedures/Studies: DG CHEST PORT 1 VIEW  Result Date: 02/09/2023 CLINICAL DATA:  Left pneumothorax. EXAM: PORTABLE CHEST 1 VIEW COMPARISON:  February 08, 2023. FINDINGS: Stable position of left-sided chest tube. No definite pneumothorax is noted. Stable left upper lobe opacity is noted most consistent with atelectasis. Stable left basilar atelectasis or scarring is noted. Stable right upper lobe infiltrate or atelectasis is noted. IMPRESSION: Stable left-sided chest tube without pneumothorax. Stable bilateral lung opacities as noted above. Electronically Signed   By: Lupita Raider M.D.   On: 02/09/2023 10:32   DG CHEST PORT 1 VIEW  Result Date: 02/08/2023 CLINICAL DATA:  Chest tube EXAM: PORTABLE CHEST 1 VIEW COMPARISON:  Chest x-ray 02/08/2023 FINDINGS: Left chest tube is unchanged in position. No left-sided pneumothorax visualized. There stable band of opacity in the left upper lobe and minimal patchy opacities in the right lung apex. Stable  calcified nodule in the right lower lobe and blunting of the left costophrenic angle. Cardiomediastinal silhouette is within normal limits. Osseous structures are unchanged. IMPRESSION: 1. Left chest tube is unchanged in position. No left-sided pneumothorax visualized. 2. Stable band of opacity in the left upper lobe and minimal patchy opacities in the right lung apex. Electronically Signed   By: Darliss Cheney M.D.   On: 02/08/2023 19:03   DG CHEST PORT 1 VIEW  Result Date: 02/08/2023 CLINICAL DATA:  [id number].  Left pneumothorax.  Left chest tube. EXAM: PORTABLE CHEST 1 VIEW COMPARISON:  Portable chest 02/06/2023 at 6:31 p.m. FINDINGS: 4:27 a.m.  There is a trace left apical pneumothorax, improved. Pigtail left chest tube with only the pigtail remaining intrathoracic is again noted in the lower lateral thorax. There are bilateral upper lobe masses again seen, on the right partially calcified, with no new abnormality in the lung fields. Minimal pleural effusions. There is mild cardiomegaly, stable mediastinum with aortic calcification. No vascular congestion. Osteopenia. IMPRESSION: 1. Trace left apical pneumothorax, improved. 2. Pigtail left chest tube with only the pigtail remaining intrathoracic. 3. Bilateral upper lobe masses again seen. 4. Minimal pleural effusions. 5. Aortic atherosclerosis. Electronically Signed   By: Almira Bar M.D.   On: 02/08/2023 07:11   DG Chest Portable 1 View  Result Date: 02/06/2023 CLINICAL DATA:  Chest pains and heaviness for 3 days. EXAM: PORTABLE CHEST 1 VIEW COMPARISON:  Portable chest today at 3:26 p.m. FINDINGS: 6:31 p.m. Small left apical pneumothorax continues to be seen, estimated 5% or less of the chest volume with pigtail chest tube interval pullback such that only the pigtail portion is in the chest cavity. Again noted is a 3 cm left upper lobe perihilar masslike opacity measuring 3 cm, irregular partially calcified mass right upper lobe apex. Stable  atelectasis in the lung bases. No new lung abnormality is seen. The cardiomediastinal silhouette is stable. The aorta is tortuous and calcified. Osteopenia. IMPRESSION: 1. Small left apical pneumothorax continues to be seen, estimated 5% or less of the chest volume with pigtail left chest tube interval pullback such that only the pigtail portion is in the chest cavity. 2. Stable 3 cm left upper lobe perihilar masslike opacity, irregular partially calcified mass right upper lobe apex. 3. Stable atelectasis  in the lung bases. 4. Aortic atherosclerosis. Electronically Signed   By: Almira Bar M.D.   On: 02/06/2023 21:49   DG Chest Port 1 View  Result Date: 02/06/2023 CLINICAL DATA:  Chest tube placement EXAM: PORTABLE CHEST 1 VIEW COMPARISON:  Chest x-ray 02/06/2023.  Chest x-ray 07/14/2019. FINDINGS: New left-sided chest tube is present. There has been re-expansion of the left lung. Small left apical pneumothorax persists measuring 13 mm from the lung apex. There is a focal slightly rounded density in the left upper lobe measuring 3 cm, indeterminate. This is increased in size. Atelectatic changes are seen in the left lung base. Stable likely calcified nodule in the right lung base. There is no pleural effusion. The cardiomediastinal silhouette appears within normal limits. No acute fractures are seen. IMPRESSION: 1. New left-sided chest tube with re-expansion of the left lung. Small left apical pneumothorax persists. 2. Rounded density in the left upper lobe measuring 3 cm has increased in size. Findings are suspicious for malignancy. Recommend further evaluation with CT. Electronically Signed   By: Darliss Cheney M.D.   On: 02/06/2023 18:23   DG Chest 2 View  Result Date: 02/06/2023 CLINICAL DATA:  Chest pain EXAM: CHEST - 2 VIEW COMPARISON:  08/30/2020 x-ray FINDINGS: Large left pneumothorax identified with volume loss and presumed atelectasis. No left-sided effusion. Normal cardiopericardial silhouette  calcified aorta. Hyperinflation of the right lung with some tiny dense nodules, unchanged from previous, possibly sequela of old granulomatous disease. No right-sided effusion, pneumothorax or edema. Critical Value/emergent results were called by telephone at the time of interpretation on 02/06/2023 at 10:51 am to provider [facility] , who verbally acknowledged these results. IMPRESSION: Large left pneumothorax. Electronically Signed   By: Karen Kays M.D.   On: 02/06/2023 13:57   DG BONE DENSITY (DXA)  Result Date: 02/04/2023 EXAM: DUAL X-RAY ABSORPTIOMETRY (DXA) FOR BONE MINERAL DENSITY IMPRESSION: Referring Physician:  [name] Your patient completed a bone mineral density test using GE Lunar iDXA system (analysis version: 16). Technologist: BEC PATIENT: Name: Worley, Kerschner Patient ID: [id number] Birth Date: 06/26/1940 Height: 67.0 in. Sex: Male Measured: 02/03/2023 Weight: 156.8 lbs. Indications: Advanced Age, Height Loss (781.91), Osteoporosis (733.00) Fractures: None Treatments: Vitamin D (E933.5), Xarelto ASSESSMENT: The BMD measured at Femur Neck Right is 0.780 g/cm2 with a T-score of -1.9. This patient is considered osteopenic/low bone mass according to World Health Organization [facility]) criteria. The lumbar spine was excluded due to being excluded on the previous study due to degenerative changes. The quality of the exam is good. Site Region Measured Date Measured Age YA BMD Significant CHANGE T-score DualFemur Neck Right 02/03/2023 83.8 -1.9 0.780 g/cm2 DualFemur Neck Right 01/27/2020 80.8 -2.0 0.755 g/cm2 DualFemur Total Mean 02/03/2023 83.8 -1.0 0.884 g/cm2 DualFemur Total Mean 01/27/2020 80.8 -1.2 0.862 g/cm2 Left Forearm Radius 33% 02/03/2023 83.8 -0.9 0.897 g/cm2 Left Forearm Radius 33% 01/27/2020 80.8 -0.7 0.936 g/cm2 World Health Organization [facility]) criteria for post-menopausal, Caucasian Women: Normal       T-score at or above -1 SD Osteopenia   T-score between -1 and -2.5 SD  Osteoporosis T-score at or below -2.5 SD RECOMMENDATION: National Osteoporosis Foundation recommends that FDA-approved medical therapies be considered in postmenopausal women and men age 7 or older with a: 1. Hip or vertebral (clinical or morphometric) fracture. 2. T-score of less than or equal to -2.5 at the spine or hip. 3. Ten-year fracture probability by FRAX of 3% or greater for hip fracture or 20% or greater for  major osteoporotic fracture. All treatment decisions require clinical judgment and consideration of individual patient factors, including patient preferences, co-morbidities, previous drug use, risk factors not captured in the FRAX model (e.g. falls, vitamin D deficiency, increased bone turnover, interval significant decline in bone density) and possible under- or over-estimation of fracture risk by FRAX. All patients should ensure an adequate intake of dietary calcium (1200 mg/d) and vitamin D (800 IU daily) unless contraindicated. FOLLOW-UP: People with diagnosed cases of osteoporosis or osteopenia should be regularly tested for bone mineral density. For patients eligible for Medicare, routine testing is allowed once every 2 years. The testing frequency can be increased to one year for patients who have rapidly progressing disease, or for those who are receiving medical therapy to restore bone mass. I have reviewed this study and agree with the findings. [facility] FRAX* 10-year Probability of Fracture Based on femoral neck BMD: DualFemur (Right) Major Osteoporotic Fracture: 6.6% Hip Fracture:                2.9% Population:                  [place] (Asian) Risk Factors:                None *FRAX is a Armed forces logistics/support/administrative officer of the [employer] of [employer] for Metabolic Bone Disease, a World Science writer (WHO) [employer]. ASSESSMENT: The probability of a major osteoporotic fracture is 6.6% within the next ten years. The probability of a hip fracture is 2.9%  within the next ten years. Electronically Signed   By: Romona Curls M.D.   On: 02/04/2023 09:07     TODAY-DAY OF DISCHARGE:  Subjective:   Gerald [name] today has no headache,no chest abdominal pain,no new weakness tingling or numbness, feels much better wants to go home today.   Objective:   Blood pressure 111/74, pulse 61, temperature 98.6 F (37 C), temperature source Oral, resp. rate 16, height 5\' 7"  (1.702 m), weight 71.7 kg, SpO2 90%.  Intake/Output Summary (Last 24 hours) at 02/09/2023 1508 Last data filed at 02/09/2023 1000 Gross per 24 hour  Intake 420 ml  Output 250 ml  Net 170 ml   Filed Weights   02/06/23 1306  Weight: 71.7 kg    Exam: Awake Alert, Oriented *3, No new F.N deficits, Normal affect [place].AT,PERRAL Supple Neck,No JVD, No cervical lymphadenopathy appriciated.  Symmetrical Chest wall movement, Good air movement bilaterally, CTAB RRR,No Gallops,Rubs or new Murmurs, No Parasternal Heave +ve B.Sounds, Abd Soft, Non tender, No organomegaly appriciated, No rebound -guarding or rigidity. No Cyanosis, Clubbing or edema, No new Rash or bruise   PERTINENT RADIOLOGIC STUDIES: DG CHEST PORT 1 VIEW  Result Date: 02/09/2023 CLINICAL DATA:  Left pneumothorax. EXAM: PORTABLE CHEST 1 VIEW COMPARISON:  February 08, 2023. FINDINGS: Stable position of left-sided chest tube. No definite pneumothorax is noted. Stable left upper lobe opacity is noted most consistent with atelectasis. Stable left basilar atelectasis or scarring is noted. Stable right upper lobe infiltrate or atelectasis is noted. IMPRESSION: Stable left-sided chest tube without pneumothorax. Stable bilateral lung opacities as noted above. Electronically Signed   By: Lupita Raider M.D.   On: 02/09/2023 10:32   DG CHEST PORT 1 VIEW  Result Date: 02/08/2023 CLINICAL DATA:  Chest tube EXAM: PORTABLE CHEST 1 VIEW COMPARISON:  Chest x-ray 02/08/2023 FINDINGS: Left chest tube is unchanged in position. No left-sided  pneumothorax visualized. There stable band of opacity in the left upper  lobe and minimal patchy opacities in the right lung apex. Stable calcified nodule in the right lower lobe and blunting of the left costophrenic angle. Cardiomediastinal silhouette is within normal limits. Osseous structures are unchanged. IMPRESSION: 1. Left chest tube is unchanged in position. No left-sided pneumothorax visualized. 2. Stable band of opacity in the left upper lobe and minimal patchy opacities in the right lung apex. Electronically Signed   By: Darliss Cheney M.D.   On: 02/08/2023 19:03   DG CHEST PORT 1 VIEW  Result Date: 02/08/2023 CLINICAL DATA:  [id number].  Left pneumothorax.  Left chest tube. EXAM: PORTABLE CHEST 1 VIEW COMPARISON:  Portable chest 02/06/2023 at 6:31 p.m. FINDINGS: 4:27 a.m.  There is a trace left apical pneumothorax, improved. Pigtail left chest tube with only the pigtail remaining intrathoracic is again noted in the lower lateral thorax. There are bilateral upper lobe masses again seen, on the right partially calcified, with no new abnormality in the lung fields. Minimal pleural effusions. There is mild cardiomegaly, stable mediastinum with aortic calcification. No vascular congestion. Osteopenia. IMPRESSION: 1. Trace left apical pneumothorax, improved. 2. Pigtail left chest tube with only the pigtail remaining intrathoracic. 3. Bilateral upper lobe masses again seen. 4. Minimal pleural effusions. 5. Aortic atherosclerosis. Electronically Signed   By: Almira Bar M.D.   On: 02/08/2023 07:11     PERTINENT LAB RESULTS: CBC: Recent Labs    02/07/23 0312  WBC 6.4  HGB 13.6  HCT 39.8  PLT 126*   CMET CMP     Component Value Date/Time   NA 138 02/07/2023 0312   NA 141 05/28/2021 0947   NA 139 04/24/2013 1305   K 3.8 02/07/2023 0312   K 4.2 04/24/2013 1305   CL 106 02/07/2023 0312   CL 108 (H) 08/14/2012 0958   CO2 24 02/07/2023 0312   CO2 27 04/24/2013 1305   GLUCOSE 101 (H)  02/07/2023 0312   GLUCOSE 104 04/24/2013 1305   GLUCOSE 101 (H) 08/14/2012 0958   BUN 18 02/07/2023 0312   BUN 17 05/28/2021 0947   BUN 22.4 04/24/2013 1305   CREATININE 1.14 02/07/2023 0312   CREATININE 1.07 03/20/2015 0959   CREATININE 1.2 04/24/2013 1305   CALCIUM 8.8 (L) 02/07/2023 0312   CALCIUM 9.1 04/24/2013 1305   PROT 6.9 02/07/2023 0312   PROT 7.1 04/24/2013 1305   ALBUMIN 3.3 (L) 02/07/2023 0312   ALBUMIN 3.9 04/24/2013 1305   AST 18 02/07/2023 0312   AST 14 04/24/2013 1305   ALT 18 02/07/2023 0312   ALT 11 04/24/2013 1305   ALKPHOS 37 (L) 02/07/2023 0312   ALKPHOS 69 04/24/2013 1305   BILITOT 0.7 02/07/2023 0312   BILITOT 0.51 04/24/2013 1305   EGFR 66 05/28/2021 0947   GFRNONAA >60 02/07/2023 0312    GFR Estimated Creatinine Clearance: 45.9 mL/min (by C-G formula based on SCr of 1.14 mg/dL). No results for input(s): "LIPASE", "AMYLASE" in the last 72 hours. No results for input(s): "CKTOTAL", "CKMB", "CKMBINDEX", "TROPONINI" in the last 72 hours. Invalid input(s): "POCBNP" No results for input(s): "DDIMER" in the last 72 hours. No results for input(s): "HGBA1C" in the last 72 hours. No results for input(s): "CHOL", "HDL", "LDLCALC", "TRIG", "CHOLHDL", "LDLDIRECT" in the last 72 hours. No results for input(s): "TSH", "T4TOTAL", "T3FREE", "THYROIDAB" in the last 72 hours.  Invalid input(s): "FREET3" No results for input(s): "VITAMINB12", "FOLATE", "FERRITIN", "TIBC", "IRON", "RETICCTPCT" in the last 72 hours. Coags: No results for input(s): "INR" in the last 72  hours.  Invalid input(s): "PT" Microbiology: No results found for this or any previous visit (from the past 240 hour(s)).  FURTHER DISCHARGE INSTRUCTIONS:  Get Medicines reviewed and adjusted: Please take all your medications with you for your next visit with your Primary MD  Laboratory/radiological data: Please request your Primary MD to go over all hospital tests and procedure/radiological  results at the follow up, please ask your Primary MD to get all Hospital records sent to his/her office.  In some cases, they will be blood work, cultures and biopsy results pending at the time of your discharge. Please request that your primary care M.D. goes through all the records of your hospital data and follows up on these results.  Also Note the following: If you experience worsening of your admission symptoms, develop shortness of breath, life threatening emergency, suicidal or homicidal thoughts you must seek medical attention immediately by calling 911 or calling your MD immediately  if symptoms less severe.  You must read complete instructions/literature along with all the possible adverse reactions/side effects for all the Medicines you take and that have been prescribed to you. Take any new Medicines after you have completely understood and accpet all the possible adverse reactions/side effects.   Do not drive when taking Pain medications or sleeping medications (Benzodaizepines)  Do not take more than prescribed Pain, Sleep and Anxiety Medications. It is not advisable to combine anxiety,sleep and pain medications without talking with your primary care practitioner  Special Instructions: If you have smoked or chewed Tobacco  in the last 2 yrs please stop smoking, stop any regular Alcohol  and or any Recreational drug use.  Wear Seat belts while driving.  Please note: You were cared for by a hospitalist during your hospital stay. Once you are discharged, your primary care physician will handle any further medical issues. Please note that NO REFILLS for any discharge medications will be authorized once you are discharged, as it is imperative that you return to your primary care physician (or establish a relationship with a primary care physician if you do not have one) for your post hospital discharge needs so that they can reassess your need for medications and monitor your lab  values.  Total Time spent coordinating discharge including counseling, education and face to face time equals greater than 30 minutes.  SignedJeoffrey Massed 02/09/2023 3:08 PM

## 2023-02-09 NOTE — Progress Notes (Signed)
PROGRESS NOTE        PATIENT DETAILS Name: Gerald [name] Age: 82 y.o. Sex: male Date of Birth: 11-15-1940 Admit Date: 02/06/2023 Admitting Physician Briant Cedar, MD BJY:[id number], Onalee Hua, MD  Brief Summary: Patient is a 82 y.o.  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  Significant events: 11/11>> admit to [facility]  Significant studies: 11/11>> large left pneumothorax  Significant microbiology data: None  Procedures: 11/11>> chest tube placement by ED physician  Consults: PCCM  Subjective: No complaints-PCCM planning to pull chest tube out today.  Objective: Vitals: Blood pressure 124/68, pulse (!) 55, temperature (!) 97.2 F (36.2 C), temperature source Oral, resp. rate 20, height 5\' 7"  (1.702 m), weight 71.7 kg, SpO2 93%.   Exam: Awake/alert No focal deficits Chest is clear to auscultation  Pertinent Labs/Radiology:    Latest Ref Rng & Units 02/07/2023    3:12 AM 02/06/2023    1:14 PM 11/08/2021    4:57 AM  CBC  WBC 4.0 - 10.5 K/uL 6.4  5.2  5.9   Hemoglobin 13.0 - 17.0 g/dL 21.3  08.6  57.8   Hematocrit 39.0 - 52.0 % 39.8  39.5  38.5   Platelets 150 - 400 K/uL 126  137  127     Lab Results  Component Value Date   NA 138 02/07/2023   K 3.8 02/07/2023   CL 106 02/07/2023   CO2 24 02/07/2023      Assessment/Plan: Left pneumothorax S/p chest tube placement by ED physician on admission Followed closely by PCCM-with plans to discontinue chest tube 11/14 Await further recommendations.  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   Weight as of this encounter: 71.7 kg.   Code status:   Code Status: Full Code   DVT Prophylaxis: SCDs Start: 02/07/23 0302 Rivaroxaban (XARELTO) tablet 15 mg    Family Communication: Daughter at bedside   Disposition Plan: Status is: Inpatient Remains inpatient appropriate because: Severity of illness   Planned Discharge Destination:Home   Diet: Diet Order             Diet Heart Room service appropriate? Yes; Fluid consistency: Thin  Diet effective now                     Antimicrobial agents: Anti-infectives (From admission, onward)    None        MEDICATIONS: Scheduled Meds:  benzonatate  200 mg Oral TID   bisoprolol  2.5 mg Oral QHS   flecainide  50 mg Oral BID   Rivaroxaban  15 mg Oral Q supper   rosuvastatin  20 mg Oral Daily   sodium chloride flush  10 mL Intrapleural Q8H   sodium chloride flush  3 mL Intravenous Q12H   Continuous Infusions:   PRN Meds:.acetaminophen **OR** acetaminophen, morphine injection, ondansetron **OR** ondansetron (ZOFRAN) IV, senna-docusate, sodium chloride flush   I have personally reviewed following labs and imaging studies  LABORATORY DATA: CBC: Recent Labs  Lab 02/06/23 1314 02/07/23 0312  WBC 5.2 6.4  HGB 13.7 13.6  HCT 39.5 39.8  MCV 95.6 96.8  PLT 137* 126*    Basic Metabolic Panel: Recent Labs  Lab 02/06/23 1314 02/07/23 0312  NA 138 138  K 4.0 3.8  CL 104 106  CO2 27 24  GLUCOSE 135*  101*  BUN 19 18  CREATININE 1.18 1.14  CALCIUM 9.4 8.8*    GFR: Estimated Creatinine Clearance: 45.9 mL/min (by C-G formula based on SCr of 1.14 mg/dL).  Liver Function Tests: Recent Labs  Lab 02/07/23 0312  AST 18  ALT 18  ALKPHOS 37*  BILITOT 0.7  PROT 6.9  ALBUMIN 3.3*   No results for input(s): "LIPASE", "AMYLASE" in the last 168 hours. No results for input(s): "AMMONIA" in the last 168 hours.  Coagulation Profile: No results for input(s): "INR", "PROTIME" in the last 168 hours.  Cardiac Enzymes: No results for input(s): "CKTOTAL", "CKMB", "CKMBINDEX", "TROPONINI" in the last 168 hours.  BNP (last 3 results) No results for input(s): "PROBNP" in the last 8760 hours.  Lipid Profile: No results for input(s): "CHOL", "HDL", "LDLCALC", "TRIG", "CHOLHDL", "LDLDIRECT" in the last 72 hours.  Thyroid Function Tests: No results for input(s): "TSH", "T4TOTAL", "FREET4", "T3FREE", "THYROIDAB" in the last 72 hours.  Anemia Panel: No results for input(s): "VITAMINB12", "FOLATE", "FERRITIN", "TIBC", "IRON", "RETICCTPCT" in the last 72 hours.  Urine analysis:    Component Value Date/Time   COLORURINE YELLOW 09/10/2009 2103   APPEARANCEUR CLEAR 09/10/2009 2103   LABSPEC 1.007 09/10/2009 2103   PHURINE 5.5 09/10/2009 2103   GLUCOSEU NEGATIVE 09/10/2009 2103   HGBUR NEGATIVE 09/10/2009 2103   BILIRUBINUR NEGATIVE 09/10/2009 2103   KETONESUR NEGATIVE 09/10/2009 2103   PROTEINUR NEGATIVE 09/10/2009 2103   UROBILINOGEN 0.2 09/10/2009 2103   NITRITE NEGATIVE 09/10/2009 2103   LEUKOCYTESUR  09/10/2009 2103    NEGATIVE MICROSCOPIC NOT DONE ON URINES WITH NEGATIVE PROTEIN, BLOOD, LEUKOCYTES, NITRITE, OR GLUCOSE <1000 mg/dL.    Sepsis Labs: Lactic Acid, Venous No results found for: "LATICACIDVEN"  MICROBIOLOGY: No results found for this or any previous visit (from the past 240 hour(s)).  RADIOLOGY STUDIES/RESULTS: DG CHEST PORT 1 VIEW  Result Date: 02/09/2023 CLINICAL  DATA:  Left pneumothorax. EXAM: PORTABLE CHEST 1 VIEW COMPARISON:  February 08, 2023. FINDINGS: Stable position of left-sided chest tube. No definite pneumothorax is noted. Stable left upper lobe opacity is noted most consistent with atelectasis. Stable left basilar atelectasis or scarring is noted. Stable right upper lobe infiltrate or atelectasis is noted. IMPRESSION: Stable left-sided chest tube without pneumothorax. Stable bilateral lung opacities as noted above. Electronically Signed   By: Lupita Raider M.D.   On: 02/09/2023 10:32   DG CHEST PORT 1 VIEW  Result Date: 02/08/2023 CLINICAL DATA:  Chest tube EXAM: PORTABLE CHEST 1 VIEW COMPARISON:  Chest x-ray 02/08/2023 FINDINGS: Left chest tube is unchanged in position. No left-sided pneumothorax visualized. There stable band of opacity in the left upper lobe and minimal patchy opacities in the right lung apex. Stable calcified nodule in the  right lower lobe and blunting of the left costophrenic angle. Cardiomediastinal silhouette is within normal limits. Osseous structures are unchanged. IMPRESSION: 1. Left chest tube is unchanged in position. No left-sided pneumothorax visualized. 2. Stable band of opacity in the left upper lobe and minimal patchy opacities in the right lung apex. Electronically Signed   By: Darliss Cheney M.D.   On: 02/08/2023 19:03   DG CHEST PORT 1 VIEW  Result Date: 02/08/2023 CLINICAL DATA:  [id number].  Left pneumothorax.  Left chest tube. EXAM: PORTABLE CHEST 1 VIEW COMPARISON:  Portable chest 02/06/2023 at 6:31 p.m. FINDINGS: 4:27 a.m.  There is a trace left apical pneumothorax, improved. Pigtail left chest tube with only the pigtail remaining intrathoracic is again noted in the lower lateral thorax. There are bilateral upper lobe masses again seen, on the right partially calcified, with no new abnormality in the lung fields. Minimal pleural effusions. There is mild cardiomegaly, stable mediastinum with aortic calcification. No  vascular congestion. Osteopenia. IMPRESSION: 1. Trace left apical pneumothorax, improved. 2. Pigtail left chest tube with only the pigtail remaining intrathoracic. 3. Bilateral upper lobe masses again seen. 4. Minimal pleural effusions. 5. Aortic atherosclerosis. Electronically Signed   By: Almira Bar M.D.   On: 02/08/2023 07:11     LOS: 2 days   Jeoffrey Massed, MD  [facility]    To contact the attending provider between 7A-7P or the covering provider during after hours 7P-7A, please log into the web site [email or web address] and access using universal [place] password for that web site. If you do not have the password, please call the hospital operator.  02/09/2023, 11:02 AM

## 2023-02-09 NOTE — Plan of Care (Signed)

## 2023-02-16 DIAGNOSIS — Z8709 Personal history of other diseases of the respiratory system: Secondary | ICD-10-CM | POA: Diagnosis not present

## 2023-02-16 DIAGNOSIS — Z85118 Personal history of other malignant neoplasm of bronchus and lung: Secondary | ICD-10-CM | POA: Diagnosis not present

## 2023-02-16 DIAGNOSIS — M810 Age-related osteoporosis without current pathological fracture: Secondary | ICD-10-CM | POA: Diagnosis not present

## 2023-02-16 DIAGNOSIS — R5381 Other malaise: Secondary | ICD-10-CM | POA: Diagnosis not present

## 2023-02-16 DIAGNOSIS — G629 Polyneuropathy, unspecified: Secondary | ICD-10-CM | POA: Diagnosis not present

## 2023-02-16 DIAGNOSIS — I77810 Thoracic aortic ectasia: Secondary | ICD-10-CM | POA: Diagnosis not present

## 2023-06-08 DIAGNOSIS — J329 Chronic sinusitis, unspecified: Secondary | ICD-10-CM | POA: Diagnosis not present

## 2023-06-08 DIAGNOSIS — R1310 Dysphagia, unspecified: Secondary | ICD-10-CM | POA: Diagnosis not present

## 2023-06-08 DIAGNOSIS — Z9221 Personal history of antineoplastic chemotherapy: Secondary | ICD-10-CM | POA: Diagnosis not present

## 2023-06-08 DIAGNOSIS — C3412 Malignant neoplasm of upper lobe, left bronchus or lung: Secondary | ICD-10-CM | POA: Diagnosis not present

## 2023-06-08 DIAGNOSIS — Z85118 Personal history of other malignant neoplasm of bronchus and lung: Secondary | ICD-10-CM | POA: Diagnosis not present

## 2023-06-08 DIAGNOSIS — R918 Other nonspecific abnormal finding of lung field: Secondary | ICD-10-CM | POA: Diagnosis not present

## 2023-06-12 ENCOUNTER — Encounter: Payer: Self-pay | Admitting: Cardiovascular Disease

## 2023-06-13 NOTE — Telephone Encounter (Signed)
 Attempted to call patient to discuss making an appointment as it has been greater than 1 year since last seen in the office.

## 2023-06-14 NOTE — Telephone Encounter (Signed)
 Spoke with patient's son. Verified name and DOB. Patient has not felt chest tightness again. I recommended to go to ER if it returns and is unrelieved by nitroglycerin. Patient's son verbalized understanding. Pt has appointment with Dr. Tresa Endo Friday 06/16/2023 at 1:40 PM.   Josie LPN

## 2023-06-16 ENCOUNTER — Ambulatory Visit: Attending: Cardiovascular Disease | Admitting: Cardiovascular Disease

## 2023-06-16 ENCOUNTER — Encounter: Payer: Self-pay | Admitting: Cardiovascular Disease

## 2023-06-16 VITALS — BP 159/81 | HR 59 | Ht 68.0 in | Wt 157.6 lb

## 2023-06-16 DIAGNOSIS — J939 Pneumothorax, unspecified: Secondary | ICD-10-CM

## 2023-06-16 DIAGNOSIS — E785 Hyperlipidemia, unspecified: Secondary | ICD-10-CM | POA: Diagnosis not present

## 2023-06-16 DIAGNOSIS — Z85118 Personal history of other malignant neoplasm of bronchus and lung: Secondary | ICD-10-CM | POA: Diagnosis not present

## 2023-06-16 DIAGNOSIS — I480 Paroxysmal atrial fibrillation: Secondary | ICD-10-CM | POA: Diagnosis not present

## 2023-06-16 DIAGNOSIS — R0789 Other chest pain: Secondary | ICD-10-CM

## 2023-06-16 DIAGNOSIS — I2510 Atherosclerotic heart disease of native coronary artery without angina pectoris: Secondary | ICD-10-CM | POA: Diagnosis not present

## 2023-06-16 DIAGNOSIS — Z7901 Long term (current) use of anticoagulants: Secondary | ICD-10-CM

## 2023-06-16 MED ORDER — FLECAINIDE ACETATE 50 MG PO TABS: 50.0000 mg | ORAL_TABLET | Freq: Two times a day (BID) | ORAL | 3 refills | Status: AC

## 2023-06-16 MED ORDER — RIVAROXABAN 15 MG PO TABS: 15.0000 mg | ORAL_TABLET | Freq: Every day | ORAL | 5 refills | Status: AC

## 2023-06-16 MED ORDER — NITROGLYCERIN 0.4 MG SL SUBL: 0.4000 mg | SUBLINGUAL_TABLET | SUBLINGUAL | 3 refills | Status: AC | PRN

## 2023-06-16 NOTE — Progress Notes (Signed)
 Cardiology Office Note    Date:  06/16/2023   ID:  [facility] [place] 1941/02/17, MRN [id number]  PCP:  Gerald Joe, MD  Cardiologist:  Gerald Guadalajara, MD   13 month F/U evaluation   History of Present Illness:  Gerald [name] is a 83 y.o.  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 Between November 2012 in March 2013 he completed 6 cycles of carboplatin, etoposide. He is status post TEE guided cardioversion in 2011. Initially he was treated with Coumadin therapy. He develop recurrent atrial fibrillation in November 2012 and since that time has been on xarelto therapy.   Since I  saw him, he has seen several of the extenders.  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  In December 2017 he saw Gerald [name], and had nondiagnostic lateral T changes On  03/30/2016 a nuclear perfusion study was normal with note scar or ischemia.  Ejection fraction was 65%.  He has had some issues with bradycardia and his dose of her blocker had been reduced.   He  seen in the emergency room in November 2018 and was in atrial fibrillation with rapid ventricular response.  He underwent cardioversion in the emergency room.  He was seen by Gerald [name] in follow-up.  He was bradycardic and as result, he was unable to further increase his metoprolol.   When I saw him in February 2019  he was doing well.  He was on metoprolol 25 mg twice a day in addition to simvastatin 40 mg and Xarelto 20 g daily.  He denied palpitations, chest pain, awareness of atrial fibrillation.   He was seen in August 2019 by Gerald [name] and at that time family had noticed that he had taken 2 sublingual nitroglycerin over the month previously.  He had atypical nonexertional chest pain.  He underwent a nuclear perfusion study on November 23, 2017 which continued  to show normal perfusion.  There were no ECG changes.  Post stress ejection fraction was 64%.  Subsequently, he has continued to do well and specifically denies chest pain, PND orthopnea.  He is unaware of palpitations presyncope or syncope.    He has been diagnosed with acute myeloid leukemia also was found to have additional lung nodules.  He has undergone radiation therapy last month.  On the evening of April 25, 2019 while sleeping he was awakened with his heart racing and also a sensation of chest heaviness.  EMS was called and he was found to be in atrial fibrillation with rapid ventricular response.  Presented to the emergency room and was given aspirin, nitroglycerin, and intravenous diltiazem.  He was anticoagulated on Xarelto.  L on the monitor he converted to sinus rhythm.  Follow-up Cardiologic evaluation was advised.    I evaluated him in a telemedicine visit in February 2021.   The patient speaks [place] but his son was with him during the interview who acted as the interpreter.  He has been seen by Gerald Course, PA on several occasions.  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  A Myoview study showed an EF at  62% with normal perfusion.  In April 2021 he developed recurrent atrial fibrillation with RVR when evaluated by Gerald Course, PA.  He did not convert with further titration of metoprolol and ultimately went to the emergency room with right shoulder pain.  Troponin was negative.  Heunderwent successful cardioversion on July 22, 2019.  He was  evaluated by Gerald Course, PA on Jul 31, 2019. I last saw him on November 18, 2019  Here was with his  son who acts as his interpreter.  Patient feels well.  He is unaware of any breakthrough heart rate irregularity.  I discussed with his son whether or not  there is a potential component of sleep apnea since he has had recurrent atrial fibrillation.  Patient has been told that he does snore mildly by his wife.  However he sleeps well.  He denies daytime sleepiness.  There is no issues of gasping for breath.  He does take an occasional daytime nap.  With reference to his lung cancer this apparently has been stable.  I last saw him on April 27, 2022.  He has been evaluated by Dr. Elberta [name] as well as Gerald [name] with his history of paroxysmal atrial fibrillation.  He underwent a myocardial perfusion study on May 27, 2021  which remained normal with nuclear stress EF at 63% without scar or ischemia.  He has been maintaining sinus rhythm and has continued to be on Xarelto with his CHA2DS2-VASc score of at least 4.  He remained on flecainide 50 mg twice a day and is on bisoprolol 2.5 mg daily.  He is on rosuvastatin 20 mg for hyperlipidemia and is anticoagulated on Xarelto 20 mg.  He was here with his son for follow-up evaluation.   He was hospitalized February 06, 2023 and was found to have left-sided pneumothorax, chest tube was placed and he was admitted for several days.  He is in the office today with his son.  He admits to feeling fairly well.  However his son states that he had experienced 1 vague episode of heaviness in his chest which was not exertionally precipitated.  He did take a sublingual nitroglycerin without prompt relief but ultimately his symptoms self abated.  He denies any recurrent episodes.  He denies any exertional component.  He continues to be on flecainide 50 mg twice a day, Xarelto 15 mg daily for anticoagulation, and is on rosuvastatin 20 mg daily for hyperlipidemia.  He has a prescription for sublingual nitroglycerin.  He presents for evaluation.   Past Medical History:  Diagnosis Date   A-fib [facility])    Allergy    seasonal allergies   AML (acute myeloid leukemia) (HCC) 04/24/2013   CAD (coronary artery disease)    last cath  2008 with noncritical CAD   GERD (gastroesophageal reflux disease)    Headache 12/17/2012   Hyperlipidemia    Leukemia (HCC)    Other pancytopenia (HCC) 04/09/2013   Small cell lung cancer (HCC) 12/2010   Smoking    Unspecified deficiency anemia 04/16/2013    Past Surgical History:  Procedure Laterality Date   CARDIAC CATHETERIZATION  01/12/2007   noncritical CAD, no change from cath of 05/20/2005. 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   CARDIOVERSION N/A 07/22/2019   Procedure: CARDIOVERSION;  Surgeon: Jake Bathe, MD;  [facility] ENDOSCOPY;  Service: Cardiovascular;  Laterality: N/A;   NM MYOCAR PERF WALL MOTION  01/23/2007   mild perfusion defect seen in the basal  inferior and mid inferior regions - c/w attenuation defect. No ischemia present.    Current Medications: Outpatient Medications Prior to Visit  Medication Sig Dispense Refill   benzonatate (TESSALON) 200 MG capsule Take 1 capsule (200 mg total) by mouth 3 (three) times daily as needed for cough. 20 capsule 0   VITAMIN D PO Take 2,000 Units by mouth every other day.      flecainide (TAMBOCOR) 50 MG tablet TAKE 1 TABLET BY MOUTH TWICE A DAY 180 tablet 3   nitroGLYCERIN (NITROSTAT) 0.4 MG SL tablet Place 1 tablet (0.4 mg total) under the tongue every 5 (five) minutes as needed for chest pain. 25 tablet 3   XARELTO 15 MG TABS tablet TAKE 1 TABLET (15 MG TOTAL) BY MOUTH DAILY WITH SUPPER 30 tablet 5   rosuvastatin (CRESTOR) 20 MG tablet Take 1 tablet (20 mg total) by mouth daily. 90 tablet 3   No facility-administered medications prior to visit.     Allergies:   Patient has no known allergies.   Social History   Socioeconomic History   Marital status: Married    Spouse name: Not on file   Number of children: Not on file   Years of education: Not on file   Highest education level: Not on file  Occupational History   Not on file  Tobacco Use   Smoking status:  Former    Current packs/day: 0.00    Average packs/day: 1 pack/day for 50.0 years (50.0 ttl pk-yrs)    Types: Cigarettes    Start date: 03/28/1956    Quit date: 03/28/2006    Years since quitting: 17.2   Smokeless tobacco: Never  Substance and Sexual Activity   Alcohol use: No   Drug use: No   Sexual activity: Not on file  Other Topics Concern   Not on file  Social History Narrative   Not on file   Social Drivers of Health   Financial Resource Strain: Not on file  Food Insecurity: No Food Insecurity (02/09/2023)   Hunger Vital Sign    Worried About Running Out of Food in the Last Year: Never true    Ran Out of Food in the Last Year: Never true  Transportation Needs: No Transportation Needs (02/09/2023)   PRAPARE - Administrator, Civil Service (Medical): No    Lack of Transportation (Non-Medical): No  Physical Activity: Not on file  Stress: Not on file  Social Connections: Not on file     Family History:  The patient's family history includes Healthy in his mother and son; Thyroid cancer in his sister.   ROS General: Negative; No fevers, chills, or night sweats;  HEENT: Negative; No changes in vision or hearing, sinus congestion, difficulty swallowing Pulmonary: Negative; No cough, wheezing, shortness of breath, hemoptysis Cardiovascular: Negative; No chest pain, presyncope, syncope, palpitations GI: Negative; No nausea, vomiting, diarrhea, or abdominal pain GU: Negative; No dysuria, hematuria, or difficulty voiding Musculoskeletal: Negative; no myalgias, joint pain, or weakness Hematologic/Oncology: Negative; no easy bruising, bleeding Endocrine: Negative; no heat/cold intolerance; no diabetes Neuro: Negative; no changes in balance, headaches Skin: Negative; No rashes or skin lesions Psychiatric: Negative; No behavioral problems, depression Sleep: Negative; No snoring, daytime sleepiness, hypersomnolence, bruxism, restless legs, hypnogognic hallucinations, no  cataplexy Other comprehensive 14 point system review is negative.   PHYSICAL EXAM:   VS:  BP (!) 159/81   Pulse (!) 59   Ht 5\' 8"  (1.727 m)   Wt 157 lb 9.6 oz (  71.5 kg)   BMI 23.96 kg/m     Repeat blood pressure by me was 135/78  Wt Readings from Last 3 Encounters:  06/16/23 157 lb 9.6 oz (71.5 kg)  02/06/23 158 lb (71.7 kg)  04/27/22 157 lb 6.4 oz (71.4 kg)    General: Alert, oriented, no distress.  He walks with a cane. Skin: normal turgor, no rashes, warm and dry HEENT: Normocephalic, atraumatic. Pupils equal round and reactive to light; sclera anicteric; extraocular muscles intact Nose without nasal septal hypertrophy Mouth/Parynx benign; Mallinpatti scale 3 Neck: No JVD, no carotid bruits; normal carotid upstroke Lungs: clear to ausculatation and percussion; no wheezing or rales Chest wall: without tenderness to palpitation Heart: PMI not displaced, regular rhythm, heart rate in the upper 50s, s1 s2 normal, 1/6 systolic murmur, no diastolic murmur, no rubs, gallops, thrills, or heaves Abdomen: soft, nontender; no hepatosplenomehaly, BS+; abdominal aorta nontender and not dilated by palpation. Back: no CVA tenderness Pulses 2+ Musculoskeletal: full range of motion, normal strength, no joint deformities Extremities: no clubbing cyanosis or edema, Homan's sign negative  Neurologic: grossly nonfocal; Cranial nerves grossly wnl Psychologic: Normal mood and affect  Studies/Labs Reviewed:   EKG Interpretation Date/Time:  Friday June 16 2023 14:12:04 EDT Ventricular Rate:  59 PR Interval:  180 QRS Duration:  92 QT Interval:  434 QTC Calculation: 429 R Axis:   69  Text Interpretation: Sinus bradycardia Nonspecific T wave abnormality When compared with ECG of 06-Feb-2023 13:51, PREVIOUS ECG IS PRESENT Confirmed by Gerald [name] ([id number]) on 06/16/2023 3:07:57 PM    April 19, 2022 ECG (independently read by me): Sinus bradycardia at 55, QTc 451 msec  November 18, 2019  ECG  (independently read by me): Sinus bradycardia 51 bpm.  No ectopy.  QTc interval 433 ms.  Recent Labs:    Latest Ref Rng & Units 02/07/2023    3:12 AM 02/06/2023    1:14 PM 11/08/2021    4:57 AM  BMP  Glucose 70 - 99 mg/dL 259  563  875   BUN 8 - 23 mg/dL 18  19  19    Creatinine 0.61 - 1.24 mg/dL 6.43  3.29  5.18   Sodium 135 - 145 mmol/L 138  138  137   Potassium 3.5 - 5.1 mmol/L 3.8  4.0  4.0   Chloride 98 - 111 mmol/L 106  104  105   CO2 22 - 32 mmol/L 24  27  23    Calcium 8.9 - 10.3 mg/dL 8.8  9.4  8.8         Latest Ref Rng & Units 02/07/2023    3:12 AM 11/08/2021    4:57 AM 08/30/2020    7:47 PM  Hepatic Function  Total Protein 6.5 - 8.1 g/dL 6.9  6.8  6.6   Albumin 3.5 - 5.0 g/dL 3.3  3.6  3.3   AST 15 - 41 U/L 18  26  29    ALT 0 - 44 U/L 18  23  41   Alk Phosphatase 38 - 126 U/L 37  48  34   Total Bilirubin <1.2 mg/dL 0.7  0.4  0.8        Latest Ref Rng & Units 02/07/2023    3:12 AM 02/06/2023    1:14 PM 11/08/2021    4:57 AM  CBC  WBC 4.0 - 10.5 K/uL 6.4  5.2  5.9   Hemoglobin 13.0 - 17.0 g/dL 84.1  66.0  63.0   Hematocrit 39.0 - 52.0 %  39.8  39.5  38.5   Platelets 150 - 400 K/uL 126  137  127    Lab Results  Component Value Date   MCV 96.8 02/07/2023   MCV 95.6 02/06/2023   MCV 96.7 11/08/2021   Lab Results  Component Value Date   TSH 2.342 03/20/2015   Lab Results  Component Value Date   HGBA1C 5.8 (H) 01/23/2011     BNP    Component Value Date/Time   BNP 175.7 (H) 01/29/2017 0440    ProBNP No results found for: "PROBNP"   Lipid Panel     Component Value Date/Time   CHOL  09/11/2009 0445    121        ATP III CLASSIFICATION:  <200     mg/dL   Desirable  161-096  mg/dL   Borderline High  >=045    mg/dL   High          TRIG 29 09/11/2009 0445   HDL 41 09/11/2009 0445   CHOLHDL 3.0 09/11/2009 0445   VLDL 6 09/11/2009 0445   LDLCALC  09/11/2009 0445    74        Total Cholesterol/HDL:CHD Risk Coronary Heart Disease Risk Table                      Men   Women  1/2 Average Risk   3.4   3.3  Average Risk       5.0   4.4  2 X Average Risk   9.6   7.1  3 X Average Risk  23.4   11.0        Use the calculated Patient Ratio above and the CHD Risk Table to determine the patient's CHD Risk.        ATP III CLASSIFICATION (LDL):  <100     mg/dL   Optimal  409-811  mg/dL   Near or Above                    Optimal  130-159  mg/dL   Borderline  914-782  mg/dL   High  >956     mg/dL   Very High     RADIOLOGY: No results found.   Additional studies/ records that were reviewed today include:  I reviewed the subsequent evaluations since his last office visit with me.   ASSESSMENT:    1. Coronary artery disease involving native coronary artery of native heart without angina pectoris   2. Paroxysmal atrial fibrillation (HCC)   3. Atypical chest pain   4. Hyperlipidemia with target LDL less than 70   5. Chronic anticoagulation   6. History of lung cancer   7. Pneumothorax on left s/p chest tube placement 02/06/23     PLAN:  Mr Motton is an 83 year old [place] gentleman who has history of paroxysmal atrial fibrillation, nonobstructive CAD, small cell carcinoma of the lung, hyperlipidemia, as well as documented mild thoracic aortic dilatation.   He developed recurrent atrial fibrillation and required an additional cardioversion  performed on July 22, 2019.  Presently, he has been maintaining sinus rhythm and has been seen by Dr. Elberta [name] and Gerald Glenn, [name].  He has undergone several nuclear stress test which have shown normal perfusion.  His last nuclear study was in March 2023 with EF 63% without scar or ischemia.  Since his last evaluation, he was hospitalized in November 2020 for and was found to have left-sided pneumothorax requiring chest tube placement  with ultimate resolution.  Presently, he continues to feel fairly well.  He walks with a cane.  His son stated that several weeks ago he had 1 episode of vague  chest heaviness.  This was not exertionally precipitated.  He did take a sublingual nitroglycerin which did not have any prompt relief but ultimately his symptoms abated.  His blood pressure today on repeat by me was 135/78.  He continues to be on flecainide 50 mg twice a day.  ECG shows sinus bradycardia 59 bpm.  QTc interval is 429 ms.  PR interval 180 ms.  There was nonspecific T wave abnormality.  Clinically, he is doing fairly well.  He continues to be on rosuvastatin 20 mg daily.  LDL cholesterol in September 2024 was 70.  He is followed by Dr. Susa Raring for primary care.  His last echo Doppler study was in 2021.  I have recommended a 4-year follow-up echo Doppler study for reassessment of systolic diastolic function and valvular architecture.  He is aware of my retirement this year and I will transition him to the care of Dr. Epifanio Lesches schedule him a 6 to 84-month follow-up evaluation.  He will continue to follow-up  with Dr. Susa Raring for primary care.  \  Medication Adjustments/Labs and Tests Ordered: Current medicines are reviewed at length with the patient today.  Concerns regarding medicines are outlined above.  Medication changes, Labs and Tests ordered today are listed in the Patient Instructions below. Patient Instructions  Medication Instructions:  No medication changes were made during today's visit.  *If you need a refill on your cardiac medications before your next appointment, please call your pharmacy*   Lab Work: No labs were ordered during today's visit.  If you have labs (blood work) drawn today and your tests are completely normal, you will receive your results only by: MyChart Message (if you have MyChart) OR A paper copy in the mail If you have any lab test that is abnormal or we need to change your treatment, we will call you to review the results.   Testing/Procedures: Your physician has requested that you have an echocardiogram. Echocardiography is a  painless test that uses sound waves to create images of your heart. It provides your doctor with information about the size and shape of your heart and how well your heart's chambers and valves are working. This procedure takes approximately one hour. There are no restrictions for this procedure. Please do NOT wear cologne, perfume, aftershave, or lotions (deodorant is allowed). Please arrive 15 minutes prior to your appointment time.  Please note: We ask at that you not bring children with you during ultrasound (echo/ vascular) testing. Due to room size and safety concerns, children are not allowed in the ultrasound rooms during exams. Our front office staff cannot provide observation of children in our lobby area while testing is being conducted. An adult accompanying a patient to their appointment will only be allowed in the ultrasound room at the discretion of the ultrasound technician under special circumstances. We apologize for any inconvenience. Your physician has requested that you have an echocardiogram. Echocardiography is a painless test that uses sound waves to create images of your heart. It provides your doctor with information about the size and shape of your heart and how well your heart's chambers and valves are working. This procedure takes approximately one hour. There are no restrictions for this procedure. Please do NOT wear cologne, perfume, aftershave, or lotions (deodorant is allowed). Please  arrive 15 minutes prior to your appointment time.  Please note: We ask at that you not bring children with you during ultrasound (echo/ vascular) testing. Due to room size and safety concerns, children are not allowed in the ultrasound rooms during exams. Our front office staff cannot provide observation of children in our lobby area while testing is being conducted. An adult accompanying a patient to their appointment will only be allowed in the ultrasound room at the discretion of the  ultrasound technician under special circumstances. We apologize for any inconvenience.    Follow-Up: At [facility], you and your health needs are our priority.  As part of our continuing mission to provide you with exceptional heart care, we have created designated Provider Care Teams.  These Care Teams include your primary Cardiologist (physician) and Advanced Practice Providers (APPs -  Physician Assistants and Nurse Practitioners) who all work together to provide you with the care you need, when you need it.  We recommend signing up for the patient portal called "MyChart".  Sign up information is provided on this After Visit Summary.  MyChart is used to connect with patients for Virtual Visits (Telemedicine).  Patients are able to view lab/test results, encounter notes, upcoming appointments, etc.  Non-urgent messages can be sent to your provider as well.   To learn more about what you can do with MyChart, go to [email or web address].    Your next appointment:   6 month(s)  Provider:   Dr. Epifanio Lesches     Other Instructions [facility]  [address] [place], [place] [address] [address] [place] [phone]       1st Floor: - Lobby - Registration  - Pharmacy  - Lab - Cafe   2nd Floor: - PV Lab - Diagnostic Testing (echo, CT, nuclear med)   3rd Floor: - Vacant   4th Floor: - TCTS (cardiothoracic surgery) - AFib Clinic - Structural Heart Clinic - Vascular Surgery  - Vascular Ultrasound   5th Floor: - HeartCare Cardiology (general and EP) - Clinical Pharmacy for coumadin, hypertension, lipid, weight-loss medications, and med management appointments      Valet parking services will be available as well.       Thank you for choosing [facility]!       Signed, Gerald Guadalajara, MD  06/16/2023 3:59 PM    [facility] [address], [place], [place]  [address] Phone: [phone]

## 2023-06-16 NOTE — Patient Instructions (Signed)
 Medication Instructions:  No medication changes were made during today's visit.  *If you need a refill on your cardiac medications before your next appointment, please call your pharmacy*   Lab Work: No labs were ordered during today's visit.  If you have labs (blood work) drawn today and your tests are completely normal, you will receive your results only by: MyChart Message (if you have MyChart) OR A paper copy in the mail If you have any lab test that is abnormal or we need to change your treatment, we will call you to review the results.   Testing/Procedures: Your physician has requested that you have an echocardiogram. Echocardiography is a painless test that uses sound waves to create images of your heart. It provides your doctor with information about the size and shape of your heart and how well your heart's chambers and valves are working. This procedure takes approximately one hour. There are no restrictions for this procedure. Please do NOT wear cologne, perfume, aftershave, or lotions (deodorant is allowed). Please arrive 15 minutes prior to your appointment time.  Please note: We ask at that you not bring children with you during ultrasound (echo/ vascular) testing. Due to room size and safety concerns, children are not allowed in the ultrasound rooms during exams. Our front office staff cannot provide observation of children in our lobby area while testing is being conducted. An adult accompanying a patient to their appointment will only be allowed in the ultrasound room at the discretion of the ultrasound technician under special circumstances. We apologize for any inconvenience. Your physician has requested that you have an echocardiogram. Echocardiography is a painless test that uses sound waves to create images of your heart. It provides your doctor with information about the size and shape of your heart and how well your heart's chambers and valves are working. This procedure  takes approximately one hour. There are no restrictions for this procedure. Please do NOT wear cologne, perfume, aftershave, or lotions (deodorant is allowed). Please arrive 15 minutes prior to your appointment time.  Please note: We ask at that you not bring children with you during ultrasound (echo/ vascular) testing. Due to room size and safety concerns, children are not allowed in the ultrasound rooms during exams. Our front office staff cannot provide observation of children in our lobby area while testing is being conducted. An adult accompanying a patient to their appointment will only be allowed in the ultrasound room at the discretion of the ultrasound technician under special circumstances. We apologize for any inconvenience.    Follow-Up: At [facility], you and your health needs are our priority.  As part of our continuing mission to provide you with exceptional heart care, we have created designated Provider Care Teams.  These Care Teams include your primary Cardiologist (physician) and Advanced Practice Providers (APPs -  Physician Assistants and Nurse Practitioners) who all work together to provide you with the care you need, when you need it.  We recommend signing up for the patient portal called "MyChart".  Sign up information is provided on this After Visit Summary.  MyChart is used to connect with patients for Virtual Visits (Telemedicine).  Patients are able to view lab/test results, encounter notes, upcoming appointments, etc.  Non-urgent messages can be sent to your provider as well.   To learn more about what you can do with MyChart, go to [email or web address].    Your next appointment:   6 month(s)  Provider:   Dr. Epifanio Lesches  Other Instructions [facility]  [address] [place], [place] [address] OPENING APRIL [phone]       1st Floor: - Lobby - Registration  - Pharmacy  - Lab - Cafe   2nd Floor: - PV Lab -  Diagnostic Testing (echo, CT, nuclear med)   3rd Floor: - Vacant   4th Floor: - TCTS (cardiothoracic surgery) - AFib Clinic - Structural Heart Clinic - Vascular Surgery  - Vascular Ultrasound   5th Floor: - [name] (general and EP) - Clinical Pharmacy for coumadin, hypertension, lipid, weight-loss medications, and med management appointments      Valet parking services will be available as well.       Thank you for choosing [name]!

## 2023-06-20 DIAGNOSIS — R059 Cough, unspecified: Secondary | ICD-10-CM | POA: Diagnosis not present

## 2023-06-20 DIAGNOSIS — R7303 Prediabetes: Secondary | ICD-10-CM | POA: Diagnosis not present

## 2023-06-20 DIAGNOSIS — E559 Vitamin D deficiency, unspecified: Secondary | ICD-10-CM | POA: Diagnosis not present

## 2023-06-20 DIAGNOSIS — Z85118 Personal history of other malignant neoplasm of bronchus and lung: Secondary | ICD-10-CM | POA: Diagnosis not present

## 2023-06-20 DIAGNOSIS — C9201 Acute myeloblastic leukemia, in remission: Secondary | ICD-10-CM | POA: Diagnosis not present

## 2023-06-20 DIAGNOSIS — E782 Mixed hyperlipidemia: Secondary | ICD-10-CM | POA: Diagnosis not present

## 2023-06-20 DIAGNOSIS — I4891 Unspecified atrial fibrillation: Secondary | ICD-10-CM | POA: Diagnosis not present

## 2023-06-20 DIAGNOSIS — R030 Elevated blood-pressure reading, without diagnosis of hypertension: Secondary | ICD-10-CM | POA: Diagnosis not present

## 2023-06-20 DIAGNOSIS — J309 Allergic rhinitis, unspecified: Secondary | ICD-10-CM | POA: Diagnosis not present

## 2023-06-20 DIAGNOSIS — I25119 Atherosclerotic heart disease of native coronary artery with unspecified angina pectoris: Secondary | ICD-10-CM | POA: Diagnosis not present

## 2023-06-20 DIAGNOSIS — D696 Thrombocytopenia, unspecified: Secondary | ICD-10-CM | POA: Diagnosis not present

## 2023-06-20 DIAGNOSIS — M810 Age-related osteoporosis without current pathological fracture: Secondary | ICD-10-CM | POA: Diagnosis not present

## 2023-07-19 DIAGNOSIS — C3412 Malignant neoplasm of upper lobe, left bronchus or lung: Secondary | ICD-10-CM | POA: Diagnosis not present

## 2023-07-19 DIAGNOSIS — C3490 Malignant neoplasm of unspecified part of unspecified bronchus or lung: Secondary | ICD-10-CM | POA: Diagnosis not present

## 2023-07-19 DIAGNOSIS — R591 Generalized enlarged lymph nodes: Secondary | ICD-10-CM | POA: Diagnosis not present

## 2023-07-19 DIAGNOSIS — J984 Other disorders of lung: Secondary | ICD-10-CM | POA: Diagnosis not present

## 2023-07-19 DIAGNOSIS — R918 Other nonspecific abnormal finding of lung field: Secondary | ICD-10-CM | POA: Diagnosis not present

## 2023-07-25 ENCOUNTER — Other Ambulatory Visit (HOSPITAL_COMMUNITY)

## 2023-07-31 ENCOUNTER — Ambulatory Visit (HOSPITAL_COMMUNITY): Attending: Cardiovascular Disease

## 2023-07-31 DIAGNOSIS — I4891 Unspecified atrial fibrillation: Secondary | ICD-10-CM | POA: Diagnosis not present

## 2023-07-31 DIAGNOSIS — I2510 Atherosclerotic heart disease of native coronary artery without angina pectoris: Secondary | ICD-10-CM | POA: Diagnosis not present

## 2023-07-31 DIAGNOSIS — I5189 Other ill-defined heart diseases: Secondary | ICD-10-CM | POA: Insufficient documentation

## 2023-07-31 DIAGNOSIS — E785 Hyperlipidemia, unspecified: Secondary | ICD-10-CM | POA: Insufficient documentation

## 2023-07-31 DIAGNOSIS — I499 Cardiac arrhythmia, unspecified: Secondary | ICD-10-CM | POA: Diagnosis not present

## 2023-07-31 DIAGNOSIS — I351 Nonrheumatic aortic (valve) insufficiency: Secondary | ICD-10-CM | POA: Diagnosis not present

## 2023-07-31 LAB — ECHOCARDIOGRAM COMPLETE
Area-P 1/2: 3.95 cm2
P 1/2 time: 735 ms
S' Lateral: 2.5 cm

## 2023-08-10 ENCOUNTER — Ambulatory Visit: Payer: Self-pay

## 2023-08-17 DIAGNOSIS — M810 Age-related osteoporosis without current pathological fracture: Secondary | ICD-10-CM | POA: Diagnosis not present

## 2023-08-17 DIAGNOSIS — C3432 Malignant neoplasm of lower lobe, left bronchus or lung: Secondary | ICD-10-CM | POA: Diagnosis not present

## 2023-09-15 DIAGNOSIS — C9201 Acute myeloblastic leukemia, in remission: Secondary | ICD-10-CM | POA: Diagnosis not present

## 2023-09-15 DIAGNOSIS — C3432 Malignant neoplasm of lower lobe, left bronchus or lung: Secondary | ICD-10-CM | POA: Diagnosis not present

## 2023-09-15 DIAGNOSIS — C3412 Malignant neoplasm of upper lobe, left bronchus or lung: Secondary | ICD-10-CM | POA: Diagnosis not present

## 2023-09-17 ENCOUNTER — Other Ambulatory Visit: Payer: Self-pay

## 2023-09-17 ENCOUNTER — Emergency Department (HOSPITAL_COMMUNITY)
Admission: EM | Admit: 2023-09-17 | Discharge: 2023-09-17 | Disposition: A | Discharge status: Home / Self Care | Attending: Emergency Medicine | Admitting: Emergency Medicine

## 2023-09-17 ENCOUNTER — Emergency Department (HOSPITAL_COMMUNITY)

## 2023-09-17 ENCOUNTER — Encounter (HOSPITAL_COMMUNITY): Payer: Self-pay

## 2023-09-17 DIAGNOSIS — Z85118 Personal history of other malignant neoplasm of bronchus and lung: Secondary | ICD-10-CM | POA: Insufficient documentation

## 2023-09-17 DIAGNOSIS — Z7901 Long term (current) use of anticoagulants: Secondary | ICD-10-CM | POA: Insufficient documentation

## 2023-09-17 DIAGNOSIS — I700 Atherosclerosis of aorta: Secondary | ICD-10-CM | POA: Diagnosis not present

## 2023-09-17 DIAGNOSIS — J90 Pleural effusion, not elsewhere classified: Secondary | ICD-10-CM | POA: Diagnosis not present

## 2023-09-17 DIAGNOSIS — I6782 Cerebral ischemia: Secondary | ICD-10-CM | POA: Diagnosis not present

## 2023-09-17 DIAGNOSIS — R531 Weakness: Secondary | ICD-10-CM | POA: Diagnosis not present

## 2023-09-17 DIAGNOSIS — I2510 Atherosclerotic heart disease of native coronary artery without angina pectoris: Secondary | ICD-10-CM | POA: Insufficient documentation

## 2023-09-17 DIAGNOSIS — R55 Syncope and collapse: Secondary | ICD-10-CM | POA: Diagnosis not present

## 2023-09-17 DIAGNOSIS — J984 Other disorders of lung: Secondary | ICD-10-CM | POA: Diagnosis not present

## 2023-09-17 DIAGNOSIS — R9089 Other abnormal findings on diagnostic imaging of central nervous system: Secondary | ICD-10-CM | POA: Diagnosis not present

## 2023-09-17 DIAGNOSIS — R42 Dizziness and giddiness: Secondary | ICD-10-CM | POA: Insufficient documentation

## 2023-09-17 LAB — COMPREHENSIVE METABOLIC PANEL WITH GFR
ALT: 30 U/L (ref 0–44)
AST: 28 U/L (ref 15–41)
Albumin: 3.6 g/dL (ref 3.5–5.0)
Alkaline Phosphatase: 45 U/L (ref 38–126)
Anion gap: 11 (ref 5–15)
BUN: 20 mg/dL (ref 8–23)
CO2: 21 mmol/L — ABNORMAL LOW (ref 22–32)
Calcium: 9.1 mg/dL (ref 8.9–10.3)
Chloride: 106 mmol/L (ref 98–111)
Creatinine, Ser: 1.09 mg/dL (ref 0.61–1.24)
GFR, Estimated: >60 mL/min (ref >60)
Glucose, Bld: 103 mg/dL — ABNORMAL HIGH (ref 70–99)
Potassium: 4.5 mmol/L (ref 3.5–5.1)
Sodium: 138 mmol/L (ref 135–145)
Total Bilirubin: 1.3 mg/dL — ABNORMAL HIGH (ref 0.0–1.2)
Total Protein: 6.9 g/dL (ref 6.5–8.1)

## 2023-09-17 LAB — CBC
HCT: 42 % (ref 39.0–52.0)
Hemoglobin: 13.9 g/dL (ref 13.0–17.0)
MCH: 32.4 pg (ref 26.0–34.0)
MCHC: 33.1 g/dL (ref 30.0–36.0)
MCV: 97.9 fL (ref 80.0–100.0)
Platelets: 161 10*3/uL (ref 150–400)
RBC: 4.29 MIL/uL (ref 4.22–5.81)
RDW: 13.2 % (ref 11.5–15.5)
WBC: 6.1 10*3/uL (ref 4.0–10.5)
nRBC: 0 % (ref 0.0–0.2)

## 2023-09-17 LAB — URINALYSIS, ROUTINE W REFLEX MICROSCOPIC
Bacteria, UA: NONE SEEN
Bilirubin Urine: NEGATIVE
Glucose, UA: NEGATIVE mg/dL
Ketones, ur: NEGATIVE mg/dL
Leukocytes,Ua: NEGATIVE
Nitrite: NEGATIVE
Protein, ur: NEGATIVE mg/dL
Specific Gravity, Urine: 1.004 — ABNORMAL LOW (ref 1.005–1.030)
pH: 6 (ref 5.0–8.0)

## 2023-09-17 LAB — CBG MONITORING, ED: Glucose-Capillary: 93 mg/dL (ref 70–99)

## 2023-09-17 MED ORDER — MECLIZINE HCL 25 MG PO TABS
25.0000 mg | ORAL_TABLET | Freq: Once | ORAL | Status: AC
  Administered 2023-09-17: 25 mg via ORAL
  Filled 2023-09-17: qty 1

## 2023-09-17 MED ORDER — SODIUM CHLORIDE 0.9 % IV BOLUS
1000.0000 mL | Freq: Once | INTRAVENOUS | Status: AC
  Administered 2023-09-17: 1000 mL via INTRAVENOUS

## 2023-09-17 MED ORDER — MECLIZINE HCL 12.5 MG PO TABS: 12.5000 mg | ORAL_TABLET | Freq: Three times a day (TID) | ORAL | 0 refills | Status: AC | PRN

## 2023-09-17 MED ORDER — GADOBUTROL 1 MMOL/ML IV SOLN
5.0000 mL | Freq: Once | INTRAVENOUS | Status: AC | PRN
  Administered 2023-09-17: 5 mL via INTRAVENOUS

## 2023-09-17 NOTE — ED Provider Notes (Signed)
  Physical Exam  BP (!) 164/78   Pulse (!) 57   Temp (!) 97.5 F (36.4 C)   Resp 20   Ht 5' 8 (1.727 m)   Wt 71.5 kg   SpO2 100%   BMI 23.97 kg/m   Physical Exam  Procedures  Procedures  ED Course / MDM   Clinical Course as of 09/17/23 1525  Sun Sep 17, 2023  1511 Recent pet scan ? Reoccurence , sarcoid or infection. 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 +meclizine   [AH]    Clinical Course User Index [AH] Arloa Chroman, [name]   Medical Decision Making Amount and/or Complexity of Data Reviewed Labs: ordered. Radiology: ordered.  Risk Prescription drug management.   Patient MRI negative. Ambulatory without assistance Improved with meclizine  + peripheral vertigo OP f/u and return precautions       Arloa Chroman, [name] 09/18/23 1006    Towana Ozell BROCKS, MD 09/18/23 1143

## 2023-09-17 NOTE — ED Triage Notes (Addendum)
 Son/translator stated, he (dad) had a dizziness this morning and about 4 days ago. Dizziness really bad to not stand up.  Denies any other symptoms.

## 2023-09-17 NOTE — ED Notes (Signed)
 Extra urine sent to main lab

## 2023-09-17 NOTE — Discharge Instructions (Signed)
 Get help away if you have any new or worsening symptoms including vision change severe headache or if you have any falls.  Be sure to use a walker or cane or have someone who can help you to make sure that you are stable when walking so you do not fall.

## 2023-09-17 NOTE — ED Provider Triage Note (Signed)
 Emergency Medicine Provider Triage Evaluation Note  [name] , a 83 y.o. male  was evaluated in triage.  Pt complains of dizziness over the last 4 days.  States that it is only present when he is at standing.  Has a previous history of atrial fibrillation..  Review of Systems  Positive: Dizziness Negative: Nausea/vomiting, chest pain, dyspnea  Physical Exam  BP (!) 150/74 (BP Location: Left Arm)   Pulse (!) 58   Temp (!) 97.5 F (36.4 C)   Resp 17   Ht 5' 8 (1.727 m)   SpO2 99%   BMI 23.96 kg/m  Gen:   Awake, no distress   Resp:  Normal effort  MSK:   Moves extremities without difficulty  Other:  No focal neurodeficits appreciated.  Poor skin turgor  Medical Decision Making  Medically screening exam initiated at 11:07 AM.  Appropriate orders placed.  [name] was informed that the remainder of the evaluation will be completed by another provider, this initial triage assessment does not replace that evaluation, and the importance of remaining in the ED until their evaluation is complete.  Lab evaluation underway upon MSE, initial evaluation of labs that have resulted do not show any abnormalities.  CMP still outstanding.  With poor skin turgor and dizziness suspect fluid deficit.   Myriam Dorn BROCKS, [place] 09/17/23 1116

## 2023-09-17 NOTE — ED Provider Notes (Cosign Needed Addendum)
 [place] EMERGENCY DEPARTMENT AT [facility] Provider Note   CSN: [id number] Arrival date & time: 09/17/23  1007     Patient presents with: Dizziness and Fatigue   Aristotelis V Holtsclaw is a 83 y.o. male.   Patient is here with his son.  Patient's son reports that patient has had dizziness today.  Patient has a past medical history of lung cancer.  Patient's son states that his cancer is back.  Patient was having difficulty walking today because he is dizzy.  He reports patient is eating well.  He does not feel patient drinks enough water.  Patient denies chest pain he denies abdominal pain.  He denies any weakness in any extremity.  He has not had any visual changes he denies any hearing changes.  Patient denies any nausea vomiting or diarrhea.  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  Patient speaks [place] he declined interpreter as he wants his son to interpret.  The history is provided by the patient and a relative. No language interpreter was used.  Dizziness      Prior to Admission medications   Medication Sig Start Date End Date Taking? Authorizing Provider  benzonatate  (TESSALON ) 200 MG capsule Take 1 capsule (200 mg total) by mouth 3 (three) times daily as needed for cough. 02/09/23   Ghimire, Donalda HERO, MD  flecainide  (TAMBOCOR ) 50 MG tablet Take 1 tablet (50 mg total) by mouth 2 (two) times daily. 06/16/23   Burnard Debby LABOR, MD  nitroGLYCERIN  (NITROSTAT ) 0.4 MG SL tablet Place 1 tablet (0.4 mg total) under the tongue every 5 (five) minutes as needed for chest pain. 06/16/23   Burnard Debby LABOR, MD  Rivaroxaban  (XARELTO ) 15 MG TABS tablet Take 1 tablet (15 mg total) by mouth daily with supper. 06/16/23   Burnard Debby LABOR, MD  rosuvastatin  (CRESTOR ) 20 MG tablet Take 1 tablet (20 mg total) by mouth daily. 05/06/20 02/07/23  [name], Bruno HERO., [name]  VITAMIN D PO Take 2,000 Units  by mouth every other day.     [provider]    Allergies: Patient has no known allergies.    Review of Systems  Neurological:  Positive for dizziness.  All other systems reviewed and are negative.   Updated Vital Signs BP (!) 153/85   Pulse (!) 56   Temp (!) 97.5 F (36.4 C)   Resp 13   Ht 5' 8 (1.727 m)   SpO2 100%   BMI 23.96 kg/m   Physical Exam Vitals and nursing note reviewed.  Constitutional:      Appearance: Normal appearance. He is well-developed.  HENT:     Head: Normocephalic and atraumatic.     Right Ear: External ear normal.     Left Ear: External ear normal.     Nose: Nose normal.     Mouth/Throat:     Mouth: Mucous membranes are moist.   Cardiovascular:     Rate and Rhythm: Normal rate.  Pulmonary:     Effort: Pulmonary effort is normal.  Abdominal:     General: There is no distension.   Musculoskeletal:        General: Normal range of motion.     Cervical back: Normal range of motion.   Skin:    General: Skin is warm.   Neurological:     General: No focal deficit present.     Mental Status: He is  alert and oriented to person, place, and time.   Psychiatric:        Mood and Affect: Mood normal.     (all labs ordered are listed, but only abnormal results are displayed) Labs Reviewed  COMPREHENSIVE METABOLIC PANEL WITH GFR - Abnormal; Notable for the following components:      Result Value   CO2 21 (*)    Glucose, Bld 103 (*)    Total Bilirubin 1.3 (*)    All other components within normal limits  URINALYSIS, ROUTINE W REFLEX MICROSCOPIC - Abnormal; Notable for the following components:   Color, Urine COLORLESS (*)    Specific Gravity, Urine 1.004 (*)    Hgb urine dipstick SMALL (*)    All other components within normal limits  CBC  CBG MONITORING, ED    EKG: None  Radiology: No results found.   Procedures   Medications Ordered in the ED - No data to display  Clinical Course as of 09/17/23 1540  Sun Sep 17, 2023  1511 Recent pet scan ? Reoccurence , sarcoid or infection. 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 +meclizine   [AH]    Clinical Course User Index [AH] Arloa Chroman, [name]                                 Medical Decision Making Patient complains of dizziness today.  He has lung cancer.  Amount and/or Complexity of Data Reviewed Independent Historian:     Details: Pt is here with his son who provides history Labs: ordered.    Details: Labs ordered reviewed and interpreted. Radiology: ordered and independent interpretation performed. Decision-making details documented in ED Course.    Details: CT head shows no acute findings. MRI with and without ordered.  Risk Risk Details: Pt's care turned over to [facility].          Final diagnoses:  Dizziness    ED Discharge Orders     None          Flint Sonny MARLA DEVONNA 09/17/23 1542    Flint Sonny MARLA DEVONNA 09/17/23 1542    Yolande Lamar BROCKS, MD 09/19/23 [id number]

## 2023-09-22 DIAGNOSIS — C3432 Malignant neoplasm of lower lobe, left bronchus or lung: Secondary | ICD-10-CM | POA: Diagnosis not present

## 2023-09-22 DIAGNOSIS — C3412 Malignant neoplasm of upper lobe, left bronchus or lung: Secondary | ICD-10-CM | POA: Diagnosis not present

## 2023-09-22 DIAGNOSIS — C9201 Acute myeloblastic leukemia, in remission: Secondary | ICD-10-CM | POA: Diagnosis not present

## 2023-10-09 DIAGNOSIS — Z9221 Personal history of antineoplastic chemotherapy: Secondary | ICD-10-CM | POA: Diagnosis not present

## 2023-10-09 DIAGNOSIS — C9200 Acute myeloblastic leukemia, not having achieved remission: Secondary | ICD-10-CM | POA: Diagnosis not present

## 2023-10-09 DIAGNOSIS — C3432 Malignant neoplasm of lower lobe, left bronchus or lung: Secondary | ICD-10-CM | POA: Diagnosis not present

## 2023-10-09 DIAGNOSIS — C3412 Malignant neoplasm of upper lobe, left bronchus or lung: Secondary | ICD-10-CM | POA: Diagnosis not present

## 2023-10-09 DIAGNOSIS — Z510 Encounter for antineoplastic radiation therapy: Secondary | ICD-10-CM | POA: Diagnosis not present

## 2023-10-10 DIAGNOSIS — C3412 Malignant neoplasm of upper lobe, left bronchus or lung: Secondary | ICD-10-CM | POA: Diagnosis not present

## 2023-10-10 DIAGNOSIS — Z510 Encounter for antineoplastic radiation therapy: Secondary | ICD-10-CM | POA: Diagnosis not present

## 2023-10-10 DIAGNOSIS — C9200 Acute myeloblastic leukemia, not having achieved remission: Secondary | ICD-10-CM | POA: Diagnosis not present

## 2023-10-10 DIAGNOSIS — Z9221 Personal history of antineoplastic chemotherapy: Secondary | ICD-10-CM | POA: Diagnosis not present

## 2023-10-10 DIAGNOSIS — C3432 Malignant neoplasm of lower lobe, left bronchus or lung: Secondary | ICD-10-CM | POA: Diagnosis not present

## 2023-10-11 DIAGNOSIS — C3432 Malignant neoplasm of lower lobe, left bronchus or lung: Secondary | ICD-10-CM | POA: Diagnosis not present

## 2023-10-11 DIAGNOSIS — Z9221 Personal history of antineoplastic chemotherapy: Secondary | ICD-10-CM | POA: Diagnosis not present

## 2023-10-11 DIAGNOSIS — C9200 Acute myeloblastic leukemia, not having achieved remission: Secondary | ICD-10-CM | POA: Diagnosis not present

## 2023-10-11 DIAGNOSIS — C3412 Malignant neoplasm of upper lobe, left bronchus or lung: Secondary | ICD-10-CM | POA: Diagnosis not present

## 2023-10-11 DIAGNOSIS — Z510 Encounter for antineoplastic radiation therapy: Secondary | ICD-10-CM | POA: Diagnosis not present

## 2023-10-12 DIAGNOSIS — C9200 Acute myeloblastic leukemia, not having achieved remission: Secondary | ICD-10-CM | POA: Diagnosis not present

## 2023-10-12 DIAGNOSIS — Z510 Encounter for antineoplastic radiation therapy: Secondary | ICD-10-CM | POA: Diagnosis not present

## 2023-10-12 DIAGNOSIS — C3432 Malignant neoplasm of lower lobe, left bronchus or lung: Secondary | ICD-10-CM | POA: Diagnosis not present

## 2023-10-12 DIAGNOSIS — Z9221 Personal history of antineoplastic chemotherapy: Secondary | ICD-10-CM | POA: Diagnosis not present

## 2023-10-12 DIAGNOSIS — C3412 Malignant neoplasm of upper lobe, left bronchus or lung: Secondary | ICD-10-CM | POA: Diagnosis not present

## 2023-10-13 DIAGNOSIS — C3412 Malignant neoplasm of upper lobe, left bronchus or lung: Secondary | ICD-10-CM | POA: Diagnosis not present

## 2023-10-13 DIAGNOSIS — Z510 Encounter for antineoplastic radiation therapy: Secondary | ICD-10-CM | POA: Diagnosis not present

## 2023-10-13 DIAGNOSIS — C3432 Malignant neoplasm of lower lobe, left bronchus or lung: Secondary | ICD-10-CM | POA: Diagnosis not present

## 2023-10-13 DIAGNOSIS — Z9221 Personal history of antineoplastic chemotherapy: Secondary | ICD-10-CM | POA: Diagnosis not present

## 2023-10-13 DIAGNOSIS — C9200 Acute myeloblastic leukemia, not having achieved remission: Secondary | ICD-10-CM | POA: Diagnosis not present

## 2023-11-16 DIAGNOSIS — C3412 Malignant neoplasm of upper lobe, left bronchus or lung: Secondary | ICD-10-CM | POA: Diagnosis not present

## 2023-11-30 ENCOUNTER — Encounter: Payer: Self-pay | Admitting: Cardiology

## 2023-12-18 DIAGNOSIS — R059 Cough, unspecified: Secondary | ICD-10-CM | POA: Diagnosis not present

## 2023-12-18 DIAGNOSIS — L308 Other specified dermatitis: Secondary | ICD-10-CM | POA: Diagnosis not present

## 2024-01-12 DIAGNOSIS — C3432 Malignant neoplasm of lower lobe, left bronchus or lung: Secondary | ICD-10-CM | POA: Diagnosis not present

## 2024-01-12 DIAGNOSIS — C9201 Acute myeloblastic leukemia, in remission: Secondary | ICD-10-CM | POA: Diagnosis not present

## 2024-01-17 DIAGNOSIS — C3412 Malignant neoplasm of upper lobe, left bronchus or lung: Secondary | ICD-10-CM | POA: Diagnosis not present
